# Patient Record
Sex: Female | Born: 1990 | Race: Black or African American | Hispanic: No | Marital: Single | Smoking: Never smoker
Health system: Southern US, Community
[De-identification: ages and names within clinical notes are randomized; demographics above are authoritative.]

## PROBLEM LIST (undated history)

## (undated) DIAGNOSIS — M4120 Other idiopathic scoliosis, site unspecified: Secondary | ICD-10-CM

## (undated) DIAGNOSIS — M549 Dorsalgia, unspecified: Secondary | ICD-10-CM

## (undated) DIAGNOSIS — E119 Type 2 diabetes mellitus without complications: Secondary | ICD-10-CM

## (undated) DIAGNOSIS — B3324 Viral cardiomyopathy: Secondary | ICD-10-CM

## (undated) DIAGNOSIS — T7840XA Allergy, unspecified, initial encounter: Secondary | ICD-10-CM

## (undated) HISTORY — DX: Dorsalgia, unspecified: M54.9

## (undated) HISTORY — DX: Viral cardiomyopathy: B33.24

## (undated) HISTORY — DX: Other idiopathic scoliosis, site unspecified: M41.20

## (undated) HISTORY — DX: Allergy, unspecified, initial encounter: T78.40XA

## (undated) HISTORY — DX: Type 2 diabetes mellitus without complications: E11.9

---
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
## 2012-05-26 DIAGNOSIS — E119 Type 2 diabetes mellitus without complications: Secondary | ICD-10-CM

## 2012-05-26 HISTORY — DX: Type 2 diabetes mellitus without complications: E11.9

## 2012-06-23 ENCOUNTER — Ambulatory Visit (INDEPENDENT_AMBULATORY_CARE_PROVIDER_SITE_OTHER): Payer: BC Managed Care – PPO | Admitting: Family Medicine

## 2012-06-23 ENCOUNTER — Encounter: Payer: Self-pay | Admitting: Family Medicine

## 2012-06-23 ENCOUNTER — Other Ambulatory Visit (HOSPITAL_COMMUNITY)
Admission: RE | Admit: 2012-06-23 | Discharge: 2012-06-23 | Disposition: A | Discharge status: Home / Self Care | Payer: BC Managed Care – PPO | Source: Ambulatory Visit | Attending: Family Medicine | Admitting: Family Medicine

## 2012-06-23 VITALS — BP 118/74 | HR 110 | Resp 15 | Wt 235.0 lb

## 2012-06-23 DIAGNOSIS — N760 Acute vaginitis: Secondary | ICD-10-CM

## 2012-06-23 DIAGNOSIS — R7303 Prediabetes: Secondary | ICD-10-CM

## 2012-06-23 DIAGNOSIS — E669 Obesity, unspecified: Secondary | ICD-10-CM

## 2012-06-23 DIAGNOSIS — B372 Candidiasis of skin and nail: Secondary | ICD-10-CM

## 2012-06-23 DIAGNOSIS — R7309 Other abnormal glucose: Secondary | ICD-10-CM

## 2012-06-23 DIAGNOSIS — Z0000 Encounter for general adult medical examination without abnormal findings: Secondary | ICD-10-CM

## 2012-06-23 LAB — CBC
MCV: 73.4 fL — ABNORMAL LOW (ref 78.0–100.0)
Platelets: 341 10*3/uL (ref 150–400)
RBC: 5.57 MIL/uL — ABNORMAL HIGH (ref 3.87–5.11)
WBC: 9.9 10*3/uL (ref 4.0–10.5)

## 2012-06-23 LAB — COMPREHENSIVE METABOLIC PANEL
CO2: 24 mEq/L (ref 19–32)
Glucose, Bld: 305 mg/dL — ABNORMAL HIGH (ref 70–99)
Sodium: 135 mEq/L (ref 135–145)
Total Bilirubin: 0.5 mg/dL (ref 0.3–1.2)
Total Protein: 7.3 g/dL (ref 6.0–8.3)

## 2012-06-23 LAB — LIPID PANEL
HDL: 37 mg/dL — ABNORMAL LOW (ref >39)
Triglycerides: 141 mg/dL (ref <150)

## 2012-06-23 LAB — HEMOGLOBIN A1C
Hgb A1c MFr Bld: 12.6 % — ABNORMAL HIGH (ref <5.7)
Mean Plasma Glucose: 315 mg/dL — ABNORMAL HIGH (ref <117)

## 2012-06-23 MED ORDER — NYSTATIN 100000 UNIT/GM EX CREA: TOPICAL_CREAM | Freq: Two times a day (BID) | CUTANEOUS | Status: DC

## 2012-06-23 MED ORDER — FLUCONAZOLE 150 MG PO TABS: 150.0000 mg | ORAL_TABLET | Freq: Once | ORAL | Status: AC

## 2012-06-23 NOTE — Patient Instructions (Addendum)
I recommend eye visit once a year I recommend dental visit every 6 months Goal is to  Exercise 30 minutes 5 days a week We will send a letter with lab results  Congratulations on the weight loss Use the cream as prescribed Take Yeast pill  F/U 1 year or as needed

## 2012-06-23 NOTE — Progress Notes (Signed)
  Subjective:    Patient ID: Samantha Lucas, female    DOB: 12-21-1990, 22 y.o.   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 Denies ETOH, illicit substances Immunizations reviewed LMP 3 weeks ago  Review of Systems   GEN- denies fatigue, fever, weight loss,weakness, recent illness HEENT- denies eye drainage, change in vision, nasal discharge, CVS- denies chest pain, palpitations RESP- denies SOB, cough, wheeze ABD- denies N/V, change in stools, abd pain GU- denies dysuria, hematuria, dribbling, incontinence MSK- denies joint pain, muscle aches, injury Neuro- denies headache, dizziness, syncope, seizure activity      Objective:   Physical Exam GEN- NAD, alert and oriented x3 HEENT- PERRL, EOMI, non injected sclera, pink conjunctiva, MMM, oropharynx clear Neck- Supple, no thyromegaly CVS- RRR, no murmur RESP-CTAB ABD-NABS,soft,NT,ND GU- External genitalia normal appearance, erythematous rash with maceration in gluteal cleft extending to labia majora- + white discharge at introitus EXT- No edema Pulses- Radial, DP- 2+        Assessment & Plan:

## 2012-06-24 ENCOUNTER — Encounter: Payer: Self-pay | Admitting: Family Medicine

## 2012-06-24 ENCOUNTER — Ambulatory Visit (INDEPENDENT_AMBULATORY_CARE_PROVIDER_SITE_OTHER): Payer: BC Managed Care – PPO | Admitting: Family Medicine

## 2012-06-24 VITALS — BP 118/74 | HR 88 | Resp 16 | Ht 70.0 in | Wt 237.0 lb

## 2012-06-24 DIAGNOSIS — B372 Candidiasis of skin and nail: Secondary | ICD-10-CM | POA: Insufficient documentation

## 2012-06-24 DIAGNOSIS — E1165 Type 2 diabetes mellitus with hyperglycemia: Secondary | ICD-10-CM | POA: Insufficient documentation

## 2012-06-24 DIAGNOSIS — Z0000 Encounter for general adult medical examination without abnormal findings: Secondary | ICD-10-CM | POA: Insufficient documentation

## 2012-06-24 DIAGNOSIS — E119 Type 2 diabetes mellitus without complications: Secondary | ICD-10-CM | POA: Insufficient documentation

## 2012-06-24 DIAGNOSIS — IMO0001 Reserved for inherently not codable concepts without codable children: Secondary | ICD-10-CM

## 2012-06-24 MED ORDER — INSULIN GLARGINE 100 UNITS/ML SOLOSTAR PEN: PEN_INJECTOR | SUBCUTANEOUS | Status: DC

## 2012-06-24 MED ORDER — LANCETS MISC: Status: DC

## 2012-06-24 NOTE — Patient Instructions (Signed)
Your goal is an A1C of 6.5% Start Lantus at bedtime 10 units Record your blood sugars- fasting ( first thing when you wake up) and before breakfast, lunch and dinner Call if you have any low blood sugars- less than 70 and you have symptoms- dizziness, shaking, sweaty, nausea-- Drink OJ or eat peanut butter, something with sugar in it Review the diet handouts F/U 1 week at [facility]

## 2012-06-24 NOTE — Assessment & Plan Note (Addendum)
CPE completed No PAP Smear needed until age 22 Not sexually active Immunizations UTD Fasting labs to be done Anticipatory guidance given- Safe sex, avoidance of tobacco and ETOH , pt is in community college and working, parents have no concerns

## 2012-06-24 NOTE — Progress Notes (Signed)
  Subjective:    Patient ID: Samantha Lucas, female    DOB: [birth year], 22 y.o.   MRN: [id number]  HPI  Patient here secondary to new diagnosis of diabetes mellitus type 2. She's a history of prediabetes fasting labs were done after her physical exam on yesterday her A1c returned at 12.6% with a fasting glucose of 305. Her mother has a history of prediabetes. She has been trying to lose weight has lost 30 pounds over the past year. She was diagnosed with candidiasis intertrigo in the gluteal and vaginal folds yesterday.  Review of Systems - per above       Objective:   Physical Exam  GEN-NAD,alert and oriented x 3, VSS      Assessment & Plan:

## 2012-06-24 NOTE — Assessment & Plan Note (Addendum)
congraulated on weight loss Continue to work on diet

## 2012-06-24 NOTE — Assessment & Plan Note (Signed)
A1C to be done, concern with obesity and ongoing rash for DM

## 2012-06-24 NOTE — Assessment & Plan Note (Addendum)
Patient with new onset diabetes mellitus type 2 secondary to diet and obesity. She also has family history of prediabetes. With her severely elevated sugars and A1c of 12% I will go ahead and start her on Lantus 10 units at bedtime.  Her mother will was weary of use of metformin. At this time she needs insulin she was taught how to use Lantus in the office as well as One Touch Verio blood glucose monitoring system We went over the notes and bolts of dietary changes for diabetes mellitus. I will have her increase her water intake as well. She is to record her blood sugars fasting and before meals. She will return to the office in one week with her logbook and her meter. I will plan to start her on metformin as well as refer her to a dietitian for counseling. As she is a very young patient and quite nave to many things we will have to take this slowy with multiple appointments to keep her on track. Patient and her mother voiced understanding   approximately 30 minutes spent with patient > than 50% of time spent on counseling on nutrition and treatment of diabetes mellitus

## 2012-06-24 NOTE — Assessment & Plan Note (Signed)
Will treat yeast with diflucan oral and and nystatin topical

## 2012-07-01 ENCOUNTER — Ambulatory Visit (INDEPENDENT_AMBULATORY_CARE_PROVIDER_SITE_OTHER): Payer: BC Managed Care – PPO | Admitting: Family Medicine

## 2012-07-01 VITALS — BP 120/80 | HR 78 | Temp 97.8°F | Resp 18 | Ht 68.0 in | Wt 238.0 lb

## 2012-07-01 DIAGNOSIS — J3489 Other specified disorders of nose and nasal sinuses: Secondary | ICD-10-CM

## 2012-07-01 DIAGNOSIS — R0981 Nasal congestion: Secondary | ICD-10-CM

## 2012-07-01 DIAGNOSIS — IMO0001 Reserved for inherently not codable concepts without codable children: Secondary | ICD-10-CM

## 2012-07-01 MED ORDER — METFORMIN HCL 500 MG PO TABS: 500.0000 mg | ORAL_TABLET | Freq: Two times a day (BID) | ORAL | Status: DC

## 2012-07-01 NOTE — Progress Notes (Signed)
  Subjective:    Patient ID: Samantha Lucas, female    DOB: 1990-03-22, 22 y.o.   MRN: [id number]  HPI  Patient here to followup diabetes mellitus. Diagnosed diabetes mellitus type 2 one week ago with an A1c of 12.6%. She was started on Lantus 10 units at bedtime. She was able to get her insulin however her mother lets me know that this cough the mom was $300 for the supply. She denies any hypoglycemia. Her fasting blood sugars this week have been 128-227. Initially her fasting blood sugars were in the 300s her fasting blood sugar the past 2 days have been less than 130. Her premeal blood sugars have been also improving now in the 160s the range was 160-228  She also complains of some nasal congestion and discharge for the past couple of days. She denies any cough or fever sinus pressure. No over-the-counter medications taken Review of Systems -per above   GEN- denies fatigue, fever, weight loss,weakness, recent illness HEENT- denies eye drainage, change in vision, +nasal discharge, CVS- denies chest pain, palpitations RESP- denies SOB, cough, wheeze Neuro- denies headache, dizziness, syncope, seizure activity      Objective:   Physical Exam  GEN- NAD, alert and oriented x3 HEENT- PERRL, EOMI, non injected sclera, pink conjunctiva, MMM, oropharynx clear, TM clear bilat no effusion,no maxillary sinus tenderness, nares ckear Neck- Supple, no LAD CVS- RRR, no murmur RESP-CTAB Pulses- Radial 2+            Assessment & Plan:

## 2012-07-01 NOTE — Patient Instructions (Addendum)
Nutrition referral for diabetes at [facility] the Metformin take 1 tablet at dinner for the next 7 days, then take 1 tablet twice a day with meals  Continue Lantus 10 units at bedtime F/U 4 weeks

## 2012-07-02 ENCOUNTER — Encounter: Payer: Self-pay | Admitting: Family Medicine

## 2012-07-02 DIAGNOSIS — R0981 Nasal congestion: Secondary | ICD-10-CM | POA: Insufficient documentation

## 2012-07-02 NOTE — Assessment & Plan Note (Signed)
CBG have improved with use of insulin, will add Metformin , see instructions, hopefully we will be able to get her off the insulin with proper diet and weight control Continue lantus 10 units Referral to diabetic nutritionist [facility]

## 2012-07-02 NOTE — Assessment & Plan Note (Signed)
No signs of acute infection, could be early URI, nasal saline

## 2012-07-05 ENCOUNTER — Telehealth: Payer: Self-pay | Admitting: Family Medicine

## 2012-07-05 ENCOUNTER — Other Ambulatory Visit: Payer: Self-pay | Admitting: Family Medicine

## 2012-07-05 DIAGNOSIS — E119 Type 2 diabetes mellitus without complications: Secondary | ICD-10-CM

## 2012-07-05 NOTE — Telephone Encounter (Signed)
REFERRAL IN WORK QUE

## 2012-07-05 NOTE — Telephone Encounter (Signed)
Message copied by Samuella Cota on Tue Jul 05, 2012 10:35 AM ------      Message from: Salley Scarlet      Created: Sat Jul 02, 2012  7:09 AM      Regarding: Please set up with [facility] Out patient Nutritionist              [facility] is no way to order this in Epic       We used a paper order form              Dx- [phone] ------

## 2012-07-08 ENCOUNTER — Telehealth (HOSPITAL_COMMUNITY): Payer: Self-pay | Admitting: Dietician

## 2012-07-08 NOTE — Telephone Encounter (Signed)
Received referral via [facility] for dx: new onset diabetes.

## 2012-07-08 NOTE — Telephone Encounter (Signed)
Called pt at 0941. Appointment scheduled for 07/26/12 at 1400.

## 2012-07-14 ENCOUNTER — Encounter: Payer: Self-pay | Admitting: Family Medicine

## 2012-07-14 NOTE — Progress Notes (Unsigned)
Patient ID: Samantha Lucas, female   DOB: May 23, 1990, 22 y.o.   MRN: [id number] Called pharmacy to see what was going on with patients lantus and pharmacy said that her insurance covered her lantus since 05/25/12. I called pt and left message on voice mail for her to return my call so that I can let her know and that she can pick them up.

## 2012-07-26 ENCOUNTER — Telehealth (HOSPITAL_COMMUNITY): Payer: Self-pay | Admitting: Dietician

## 2012-07-26 NOTE — Telephone Encounter (Signed)
Pt was a no-show for appointment scheduled for 07/26/2012 at 1400. Sent letter to pt home notifying pt of no-show and requesting rescheduling appointment.

## 2012-07-27 NOTE — Telephone Encounter (Signed)
noted 

## 2012-07-28 ENCOUNTER — Telehealth (HOSPITAL_COMMUNITY): Payer: Self-pay | Admitting: Dietician

## 2012-07-28 NOTE — Telephone Encounter (Signed)
Received voicemail from mother left on 1328. She requests rescheduling appointment. She was apologetic for missing previous appointment- she reports she wrote down the wrong date.

## 2012-07-28 NOTE — Telephone Encounter (Signed)
Appointment rescheduled for 08/10/12 at 1400.

## 2012-08-08 ENCOUNTER — Encounter: Payer: Self-pay | Admitting: Family Medicine

## 2012-08-08 ENCOUNTER — Ambulatory Visit (INDEPENDENT_AMBULATORY_CARE_PROVIDER_SITE_OTHER): Payer: BC Managed Care – PPO | Admitting: Family Medicine

## 2012-08-08 VITALS — BP 120/80 | HR 80 | Temp 98.5°F | Resp 20 | Wt 241.0 lb

## 2012-08-08 DIAGNOSIS — IMO0001 Reserved for inherently not codable concepts without codable children: Secondary | ICD-10-CM

## 2012-08-08 MED ORDER — METFORMIN HCL 1000 MG PO TABS: 1000.0000 mg | ORAL_TABLET | Freq: Two times a day (BID) | ORAL | Status: DC

## 2012-08-08 MED ORDER — LISINOPRIL 2.5 MG PO TABS: 2.5000 mg | ORAL_TABLET | Freq: Every day | ORAL | Status: DC

## 2012-08-08 NOTE — Patient Instructions (Addendum)
New dose of the metformin is 1000mg  twice  A day with meals Continue lantus 10 units Start the lisinopril once a day for your kidney's F/U 1st week of September

## 2012-08-08 NOTE — Progress Notes (Signed)
  Subjective:    Patient ID: Samantha Lucas, female    DOB: [birth year], 22 y.o.   MRN: [id number]  HPI  Patient here for interim visit for her diabetes mellitus. She's currently on Lantus 10 units and metformin 500 mg twice a day. She denies any hypoglycemia. Her fasting blood sugars have been 89-180. Post meals 140-180. She did have a blood sugar of 85 before bedtime one night and did not take the Lantus. Unfortunately she is not following her diet and has picked a fast food again. She's gained 3 pounds since her last visit.  Her blood glucose meter/readings were reviewed in the office  Review of Systems - per above   GEN- denies fatigue, fever, weight loss,weakness, recent illness ENDO- denies polyuria, polydipsia, hypoglycemia        Objective:   Physical Exam GEN-NAD,alert and oriented x 3       Assessment & Plan:

## 2012-08-09 NOTE — Assessment & Plan Note (Addendum)
Continue lantus as I titrate orals, CBG have improved, increase metformin to 1 gram BID Add lose dose ACEI Goal is to get her on proper diet and oral medications F/U for A1C in 6 weeks

## 2012-08-10 ENCOUNTER — Encounter (HOSPITAL_COMMUNITY): Payer: Self-pay | Admitting: Dietician

## 2012-08-10 NOTE — Progress Notes (Signed)
Outpatient Initial Nutrition Assessment  Date:08/10/2012   Appt Start Time: 1356  Referring Physician: Dr. Jeanice Lim Reason for Visit: new onset DM  Nutrition Assessment:  Height: 5\' 8"  (172.7 cm)  93%ile (Z=1.45) based on CDC 2-20 Years stature-for-age data. Weight: 242 lb (109.77 kg)  99%ile (Z=2.44) based on CDC 2-20 Years weight-for-age data. IBW: 140# %IBW: 173% UBW: 235# %UBW: 103%  Body mass index is 36.8 kg/(m^2). Meets criteria for obesity (BMI >95%ile for age). Goal Weight: 218# (10% loss of current weight) Weight hx: Pt reports highest weight was 242# (today's weight). Samantha Lucas reports that her weight is usually around 235#.   Estimated nutritional needs:  Kcals/ day: 1900-2000 Protein (grams)/day: 88-110 Fluid (L)/ day: 1.9-2.0  PMH:  Past Medical History  Diagnosis Date  . Scoliosis (and kyphoscoliosis), idiopathic   . Diabetes mellitus without complication May 2014    Medications:  Current Outpatient Rx  Name  Route  Sig  Dispense  Refill  . insulin glargine (LANTUS) 100 units/mL SOLN      Inject 10 units at bedtime   10 mL   6     Please given lantus pen and pen needles - new onse ...   . Lancets MISC      One Touch verio lancets  Dx 250.00, Test CBG four times a day   100 each   11   . lisinopril (ZESTRIL) 2.5 MG tablet   Oral   Take 1 tablet (2.5 mg total) by mouth daily. To protect kidneys   30 tablet   3   . metFORMIN (GLUCOPHAGE) 1000 MG tablet   Oral   Take 1 tablet (1,000 mg total) by mouth 2 (two) times daily with a meal.   60 tablet   3     New dose   . Multiple Vitamin (MULITIVITAMIN WITH MINERALS) TABS   Oral   Take 1 tablet by mouth daily.         . NON FORMULARY      One Touch Verio- Glucometer and test strips Test CBG Four times a day Dx 250.00         . nystatin cream (MYCOSTATIN)   Topical   Apply topically 2 (two) times daily.   30 g   2     Labs: CMP     Component Value Date/Time   NA 135 06/23/2012 1000   K 4.5  06/23/2012 1000   CL 100 06/23/2012 1000   CO2 24 06/23/2012 1000   GLUCOSE 305* 06/23/2012 1000   BUN 9 06/23/2012 1000   CREATININE 0.63 06/23/2012 1000   CREATININE 0.62 04/14/2007 1200   CALCIUM 9.8 06/23/2012 1000   PROT 7.3 06/23/2012 1000   ALBUMIN 4.0 06/23/2012 1000   AST 11 06/23/2012 1000   ALT 8 06/23/2012 1000   ALKPHOS 140* 06/23/2012 1000   BILITOT 0.5 06/23/2012 1000   GFRNONAA NOT CALCULATED 04/14/2007 1200   GFRAA  Value: NOT CALCULATED        The eGFR has been calculated using the MDRD equation. This calculation has not been validated in all clinical 04/14/2007 1200    Lipid Panel     Component Value Date/Time   CHOL 162 06/23/2012 1000   TRIG 141 06/23/2012 1000   HDL 37* 06/23/2012 1000   CHOLHDL 4.4 06/23/2012 1000   VLDL 28 06/23/2012 1000   LDLCALC 97 06/23/2012 1000     Lab Results  Component Value Date   HGBA1C 12.6* 06/23/2012   Lab  Results  Component Value Date   LDLCALC 97 06/23/2012   CREATININE 0.63 06/23/2012     Lifestyle/ social habits: Samantha Lucas who resides in [place] with her mother and maternal grandparents. 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 During the school year, Samantha Lucas attends nursing school at [employer]. Samantha Lucas reports her stress level as 1/10, stating Samantha Lucas is not stressed at all. Samantha Lucas reports that Samantha Lucas has been trying to be physically active, but admits that Samantha Lucas is not as active as Samantha Lucas should be. Samantha Lucas started doing ab crunches with the assistance of an app on her phone. Samantha Lucas was also walking daily, but recently stopped due to the hot weather. Samantha Lucas also plays her Wii Fit.  Nutrition hx/habits: Samantha Lucas reports Samantha Lucas is concerned about her dx, due to her family hx. Diabetes runs on both her mother's and father's side of the family; Samantha Lucas reveals that her father passed away several years ago from a stroke. Samantha Lucas has been trying to follow a healthier diet, which mainly consisted of baked foods and  vegetables. Samantha Lucas reports that Samantha Lucas recently resumed her fast food habits and has gained weight in the past few months.  Samantha Lucas has been consistent with her self management practices. Samantha Lucas checks her blood sugars 6 times per day: Am fasting, before and lafter lunch and dinner, and q HS. Samantha Lucas reports AM fasting readings in the 110s, 120 before meals, 135-147 postprandial, and 150-170 q HS.  Samantha Lucas normally eats out 1-2 times per week and frequents [employer] (beef burrito and nachos) or [employer] (gets 12" grilled chicken sandwich or grilled chicken salad). When Samantha Lucas eats out, Samantha Lucas orders sweet tea. When Samantha Lucas is home, Samantha Lucas makes good food choices, choosing baked meats, vegetables, and water. Samantha Lucas expresses concern about transitioning care once school has started.    Diet recall: Breakfast (9 Am-12PM): 2 slices of white toast with jelly, 2 slices bacon, 1 scrambled egg, water; Lunch (2-4 PM): fast food meal ([employer] (beef burrito and nachos) or [employer] (gets 12" grilled chicken sandwich or grilled chicken salad), sweet tea) OR baked tilapia, broccoli, water; Dinner: broccoli, tilapia, water; HS snack: almonds.   Nutrition Diagnosis: Nutrition-related knowledge deficit r/t new onset diabetes AEB Hgb A1c: 12.6.  Nutrition Intervention: Nutrition rx: 1500 kcal NAS, diabetic diet; 3 meals per day (45-60 grams carbohydrate per meal); no snacks; low calorie beverages only; 30 minutes physical activity daily  Education/Counseling Provided: Educated pt on principles of diabetic diet ad weight management. Discussed carbohydrate metabolism in relation to diabetes. Educated pt on basic self-management principles including: signs and symptoms of hyperglycemia and hypoglycemia, goals for fasting and postprandial blood sugars, goals for Hgb A1c, importance of checking feet, importance of keeping PCP appointments, and foot care. Discussed principles of energy expenditure and how changes in diet and physical activity affect weight status.  Educated pt on plate method, portion sizes, and sources of carbohydrate. Discussed importance of regular meal pattern. Discussed importance of adding sources of whole grains to diet to improve glycemic control. Also encouraged to choose low fat dairy, lean meats, and whole fruits and vegetables more often. Discussed options of artificial sweeteners and encouraged pt to use which brand Samantha Lucas liked best. Discussed nutritional content of foods commonly eaten and discussed healthier alternatives. Discussed importance of compliance to prevent further complications of disease. Educated pt on importance of physical activity (goal of at least 30 minutes 5 times per week) along  with a healthy diet to achieve weight loss and glycemic goals. Encouraged slow, moderate weight loss of 1-2# per week, or 7-10% of current body weight. Discussed adopting lifelong lifestyle changes vs. Attaining a certain weight or body type. Discussed specific examples of ways pt can make better choices at restaurants. Provided "10 Simple Steps", "Diabetes and You", "Carbohydrate Counting and Meal Planning", and "Blood Sugar Diary" handouts.Showed pt functionality of MyFitnessPal and encouraged using a food diary to better track caloric intake. Used TeachBack to assess understanding.   Understanding, Motivation, Ability to Follow Recommendations: Expect good compliance.   Monitoring and Evaluation: Goals: 1) 0.5-2# wt loss per week; 2) Hgb A1c <7; 3) Fasting blood sugar: 70-130, postprandial less than 180; 4) Keep food diary (ex My FitnessPal); 5) 30 minutes physical activity daily   Recommendations: 1) For weight loss: 1400-1500 kcals daily; 2) Choose kids meal or dollar menu entrees when eating out; 3) Share entrees at restaurants or take 1/2 home in a take away box; 4) Break physical activity into smaller, more frequent weight sessions; 5) Walking in the early or later part of the day, when it is not as hot  F/U: 4-6 weeks. Appointment  scheduled for 09/07/12 at 1400.   Melody Haver, RD, LDN 08/10/2012  Appt EndTime: 1530

## 2012-09-07 ENCOUNTER — Telehealth (HOSPITAL_COMMUNITY): Payer: Self-pay | Admitting: Dietician

## 2012-09-07 NOTE — Telephone Encounter (Signed)
Received voicemail from pt left at 0930. Pt would like to reschedule today's appointment at 1400, due to death in the family.

## 2012-09-07 NOTE — Telephone Encounter (Signed)
Called back at 1408. Rescheduled for 09/20/12 at 1400.

## 2012-09-20 ENCOUNTER — Encounter (HOSPITAL_COMMUNITY): Payer: Self-pay | Admitting: Dietician

## 2012-09-20 NOTE — Progress Notes (Signed)
Follow-Up Outpatient Nutrition Note Date: 09/20/2012  Appt Start Time: 1340  Nutrition Assessment:  Current weight: Weight: 248 lb (112.492 kg)  BMI: Body mass index is 37.72 kg/(m^2).  Weight changes: +6# (2.5%) x 6 weeks  Unfortunately, Samantha Lucas has gained weight since last visit. She reports that her clothes are looser, but not enough to buy smaller sizes in clothing. She reports to me she struggles with her diet, especially due to her school schedule. She does not always get an adequate lunch break. Mondays, Wednesdays, and Thursdays are busiest for her because she has lab classes and stays on campus pretty much all day. She rarely eats fast food, but when she does she orders a plain chicken sandwich. She always has water with her.  CBGs are good. She is now taking CBGs 4 times per day- Am fasting between 80-90, postprandial breakfast and dinner less than 140, and HS 70-100. She reports that her blood sugar trends low in the evening and she does not always take her nighttime insulin for that reason.  Overall, she is making better food choices. However, she continues to be physically inactive. She reports that she has not been exercising due to the hot weather. Physical activity mainly consists of 5-10 minutes of walking between classes. She reveals that there is a Building surveyor on campus that she can use free of charge and is contemplating working out with her cousin, who is also a Consulting civil engineer there. She is having some difficult with time management to optimally balance her health, work, and school work.   Labs: CMP     Component Value Date/Time   NA 135 06/23/2012 1000   K 4.5 06/23/2012 1000   CL 100 06/23/2012 1000   CO2 24 06/23/2012 1000   GLUCOSE 305* 06/23/2012 1000   BUN 9 06/23/2012 1000   CREATININE 0.63 06/23/2012 1000   CREATININE 0.62 04/14/2007 1200   CALCIUM 9.8 06/23/2012 1000   PROT 7.3 06/23/2012 1000   ALBUMIN 4.0 06/23/2012 1000   AST 11 06/23/2012 1000   ALT 8 06/23/2012 1000   ALKPHOS  140* 06/23/2012 1000   BILITOT 0.5 06/23/2012 1000   GFRNONAA NOT CALCULATED 04/14/2007 1200   GFRAA  Value: NOT CALCULATED        The eGFR has been calculated using the MDRD equation. This calculation has not been validated in all clinical 04/14/2007 1200    Lipid Panel     Component Value Date/Time   CHOL 162 06/23/2012 1000   TRIG 141 06/23/2012 1000   HDL 37* 06/23/2012 1000   CHOLHDL 4.4 06/23/2012 1000   VLDL 28 06/23/2012 1000   LDLCALC 97 06/23/2012 1000     Lab Results  Component Value Date   HGBA1C 12.6* 06/23/2012   Lab Results  Component Value Date   LDLCALC 97 06/23/2012   CREATININE 0.63 06/23/2012     Diet recall: Breakfast: 2 whole wheat eggo waffles with light syrup; Lunch: pbj and almonds; Dinner: chicken stirfry. Beverages are mainly water.   Nutrition Diagnosis: Nutrition-related knowledge deficit r/t new onset diabetes- continues  Nutrition Intervention: Nutrition rx: 1500 kcal NAS, diabetic diet; 3 meals per day (45-60 grams carbohydrate per meal); no snacks; low calorie beverages only; 30 minutes physical activity daily  Education/ counseling provided: Reviewed diet recall and exercise. Discussed specific examples in which pt could improve diet. Praised pt for progress made in diet. Discussed importance of regular physical activity to aid in weight management and glycemic control. Discussed ways to  incorporate physical activity in daily routine. Stressed importance of exercising outside of daily routine. Teachback method used.   Understanding/Motivation/ Ability to follow recommendations: Expect fair to good compliance.  Monitoring and Evaluation: Previous Goals: 1) 0.5-2# wt loss per week- goal not met; 2) Hgb A1c <7; 3) Fasting blood sugar: 70-130, postprandial less than 180- goal met; 4) Keep food diary (ex My FitnessPal)- goal not met; 5) 30 minutes physical activity daily- goal not met   Goals for next visit: 1) 0.5-2# wt loss per week; 2) Hgb A1c <7; 3) Fasting  blood sugar: 70-130, postprandial less than 180; 4) 2.5 hours physical activity per week  Recommendations: 1) Break physical activity into smaller, more frequent sessions; 2) Walk in the early or later part of the day, when it is not as hot; 3) Use [employer] student gym with cousin and classmates when at school  F/U: 4-6 weeks. Scheduled for Tuesday 11/01/12 at 1400.  Paulino Cork A. Mayford Knife, RD, LDN Date:09/20/2012 Appt EndTime:

## 2012-09-30 ENCOUNTER — Ambulatory Visit (INDEPENDENT_AMBULATORY_CARE_PROVIDER_SITE_OTHER): Payer: BC Managed Care – PPO | Admitting: Family Medicine

## 2012-09-30 VITALS — BP 120/80 | HR 90 | Temp 98.1°F | Resp 20 | Wt 249.0 lb

## 2012-09-30 DIAGNOSIS — IMO0001 Reserved for inherently not codable concepts without codable children: Secondary | ICD-10-CM

## 2012-09-30 DIAGNOSIS — E669 Obesity, unspecified: Secondary | ICD-10-CM

## 2012-09-30 LAB — HEMOGLOBIN A1C, FINGERSTICK: Hgb A1C (fingerstick): 6.7 % — ABNORMAL HIGH (ref <5.7)

## 2012-09-30 MED ORDER — GLIPIZIDE 5 MG PO TABS: 5.0000 mg | ORAL_TABLET | Freq: Two times a day (BID) | ORAL | Status: DC

## 2012-09-30 MED ORDER — METFORMIN HCL 500 MG PO TABS: 500.0000 mg | ORAL_TABLET | Freq: Two times a day (BID) | ORAL | Status: DC

## 2012-09-30 NOTE — Progress Notes (Signed)
  Subjective:    Patient ID: Samantha Lucas, female    DOB: 05/31/1990, 22 y.o.   MRN: [id number]  HPI  Pt here to f/u DM, diagnosed 3 months ago, with Type II DM. Fasting CBG have been 80-120. Lunch 116-138, dinner 92-142, she had 2 episodes of hypoglycemia where she missed lunch at school.  Denies polyuria, polydipsia.  No other complaints Following with nutrition, but is not exercising Restarted college classes recently  Review of Systems   GEN- denies fatigue, fever, weight loss,weakness, recent illness HEENT- denies eye drainage, change in vision, nasal discharge, CVS- denies chest pain, palpitations RESP- denies SOB, cough, wheeze ABD- denies N/V, change in stools, abd pain GU- denies dysuria, hematuria, dribbling, incontinence MSK- denies joint pain, muscle aches, injury Neuro- denies headache, dizziness, syncope, seizure activity      Objective:   Physical Exam GEN- NAD, alert and oriented x3 HEENT- PERRL, EOMI, non injected sclera, pink conjunctiva, MMM, oropharynx clear CVS- RRR, no murmur RESP-CTAB EXT- No edema Pulses- Radial, DP- 2+        Assessment & Plan:

## 2012-09-30 NOTE — Patient Instructions (Signed)
Take 1/2 tablet of the metformin twice a day until you run out , then get new prescription Start glipzide 1 tablet twice a day Check blood sugars fasting  Stop the lantus  F/U 3 months

## 2012-10-01 ENCOUNTER — Encounter: Payer: Self-pay | Admitting: Family Medicine

## 2012-10-01 NOTE — Assessment & Plan Note (Addendum)
A1C shows excellent progress. I think with her attention to meals and nutrition we can safely discontinue insulin and switch her over to orals Since the higher dose of Metformin is causing diarrhea, will decrease her to metformin 500mg  BID and add Glipizide 5mg  BID Continue ACEI She does not have Eye insurance but will look into [facility]  Urine Micro next visit

## 2012-10-01 NOTE — Assessment & Plan Note (Signed)
Weight gain noted, she is not exercising , short term goal set

## 2012-11-01 ENCOUNTER — Telehealth (HOSPITAL_COMMUNITY): Payer: Self-pay | Admitting: Dietician

## 2012-11-01 NOTE — Telephone Encounter (Signed)
Pt was a no-show for appointment scheduled for 11/01/2012 at 1400. Sent letter to pt home notifying pt of no-show and requesting rescheduling appointment.

## 2012-12-01 ENCOUNTER — Other Ambulatory Visit: Payer: Self-pay

## 2012-12-06 ENCOUNTER — Other Ambulatory Visit: Payer: Self-pay | Admitting: *Deleted

## 2012-12-06 MED ORDER — LISINOPRIL 2.5 MG PO TABS: 2.5000 mg | ORAL_TABLET | Freq: Every day | ORAL | Status: DC

## 2012-12-06 NOTE — Telephone Encounter (Signed)
Meds refilled.

## 2012-12-30 ENCOUNTER — Ambulatory Visit: Payer: BC Managed Care – PPO | Admitting: Family Medicine

## 2013-01-10 ENCOUNTER — Ambulatory Visit (INDEPENDENT_AMBULATORY_CARE_PROVIDER_SITE_OTHER): Payer: BC Managed Care – PPO | Admitting: Family Medicine

## 2013-01-10 ENCOUNTER — Encounter: Payer: Self-pay | Admitting: Family Medicine

## 2013-01-10 VITALS — BP 110/70 | HR 80 | Temp 98.3°F | Resp 18 | Ht 70.0 in | Wt 260.0 lb

## 2013-01-10 DIAGNOSIS — E669 Obesity, unspecified: Secondary | ICD-10-CM

## 2013-01-10 DIAGNOSIS — IMO0001 Reserved for inherently not codable concepts without codable children: Secondary | ICD-10-CM

## 2013-01-10 DIAGNOSIS — Z23 Encounter for immunization: Secondary | ICD-10-CM

## 2013-01-10 LAB — CBC
HCT: 34.9 % — ABNORMAL LOW (ref 36.0–46.0)
Hemoglobin: 11.3 g/dL — ABNORMAL LOW (ref 12.0–15.0)
RBC: 4.93 MIL/uL (ref 3.87–5.11)
WBC: 11.6 10*3/uL — ABNORMAL HIGH (ref 4.0–10.5)

## 2013-01-10 LAB — BASIC METABOLIC PANEL
Calcium: 9.4 mg/dL (ref 8.4–10.5)
Sodium: 135 mEq/L (ref 135–145)

## 2013-01-10 LAB — MICROALBUMIN / CREATININE URINE RATIO
Creatinine, Urine: 224.4 mg/dL
Microalb, Ur: 0.5 mg/dL (ref 0.00–1.89)

## 2013-01-10 MED ORDER — GLIPIZIDE 5 MG PO TABS: 5.0000 mg | ORAL_TABLET | Freq: Two times a day (BID) | ORAL | Status: DC

## 2013-01-10 NOTE — Assessment & Plan Note (Addendum)
She's had some deterioration in her A1c to 7.1%. I will continue her current medications. We discussed the weight gain and nutrition. I've given her specific handout and diet plan regarding her diabetes to He is on ACE inhibitor. I will check her urine microalbumin today. Flu shot and pneumovax

## 2013-01-10 NOTE — Assessment & Plan Note (Signed)
Short-term goal set for weight loss

## 2013-01-10 NOTE — Patient Instructions (Signed)
We will send a letter if the rest of the labs are normal Work on weight loss Goal 15lbs by next visit  Review handouts on healthy eating  Flu shot and pneumonia shot given F/U 3 months

## 2013-01-10 NOTE — Progress Notes (Signed)
   Subjective:    Patient ID: Samantha Lucas, female    DOB: Oct 29, 1990, 22 y.o.   MRN: [id number]  HPI Patient here to followup diabetes mellitus. Her last A1c was 6.7%. She's currently on glipizide and metformin. Her blood sugars fasting on average have been 119. Her 7 day average was 118. She has gained 11 pounds since her last visit states she's been eating a lot fast food which is at her college. She's also not been exercising. She has missed her appointment with the nutritionist. Due for flu shot and pneumonia vaccine She has no specific concerns today. Denies any hypoglycemia symptoms.   Review of Systems  GEN- denies fatigue, fever, weight loss,weakness, recent illness HEENT- denies eye drainage, change in vision, nasal discharge, CVS- denies chest pain, palpitations RESP- denies SOB, cough, wheeze ABD- denies N/V, change in stools, abd pain GU- denies dysuria, hematuria, dribbling, incontinence MSK- denies joint pain, muscle aches, injury Neuro- denies headache, dizziness, syncope, seizure activity      Objective:   Physical Exam GEN- NAD, alert and oriented x3 HEENT- PERRL, EOMI, non injected sclera, pink conjunctiva, MMM, oropharynx clear Neck- Supple, no thyromegaly CVS- RRR, no murmur RESP-CTAB EXT- No edema, feet no open lesions, normal monofilament Pulses- Radial, DP- 2+        Assessment & Plan:

## 2013-01-11 ENCOUNTER — Encounter: Payer: Self-pay | Admitting: *Deleted

## 2013-03-17 ENCOUNTER — Other Ambulatory Visit: Payer: Self-pay | Admitting: Family Medicine

## 2013-04-10 ENCOUNTER — Ambulatory Visit: Payer: BC Managed Care – PPO | Admitting: Family Medicine

## 2013-04-14 ENCOUNTER — Ambulatory Visit (INDEPENDENT_AMBULATORY_CARE_PROVIDER_SITE_OTHER): Payer: BC Managed Care – PPO | Admitting: Family Medicine

## 2013-04-14 ENCOUNTER — Encounter: Payer: Self-pay | Admitting: Family Medicine

## 2013-04-14 VITALS — BP 120/78 | HR 82 | Temp 98.2°F | Resp 16 | Ht 70.0 in | Wt 256.0 lb

## 2013-04-14 DIAGNOSIS — IMO0001 Reserved for inherently not codable concepts without codable children: Secondary | ICD-10-CM

## 2013-04-14 DIAGNOSIS — E1165 Type 2 diabetes mellitus with hyperglycemia: Principal | ICD-10-CM

## 2013-04-14 DIAGNOSIS — E669 Obesity, unspecified: Secondary | ICD-10-CM

## 2013-04-14 MED ORDER — LISINOPRIL 2.5 MG PO TABS: 2.5000 mg | ORAL_TABLET | Freq: Every day | ORAL | Status: DC

## 2013-04-14 NOTE — Patient Instructions (Signed)
We will call with lab results  Continue current medications for now  F/U 3 months  

## 2013-04-15 LAB — BASIC METABOLIC PANEL
BUN: 7 mg/dL (ref 6–23)
CO2: 27 mEq/L (ref 19–32)
CREATININE: 0.64 mg/dL (ref 0.50–1.10)
Calcium: 9.1 mg/dL (ref 8.4–10.5)
Chloride: 104 mEq/L (ref 96–112)
GLUCOSE: 155 mg/dL — AB (ref 70–99)
POTASSIUM: 4.2 meq/L (ref 3.5–5.3)
Sodium: 138 mEq/L (ref 135–145)

## 2013-04-15 LAB — CBC WITH DIFFERENTIAL/PLATELET
BASOS PCT: 0 % (ref 0–1)
Basophils Absolute: 0 10*3/uL (ref 0.0–0.1)
EOS PCT: 2 % (ref 0–5)
Eosinophils Absolute: 0.2 10*3/uL (ref 0.0–0.7)
HEMATOCRIT: 34.8 % — AB (ref 36.0–46.0)
HEMOGLOBIN: 10.9 g/dL — AB (ref 12.0–15.0)
Lymphocytes Relative: 34 % (ref 12–46)
Lymphs Abs: 3.5 10*3/uL (ref 0.7–4.0)
MCH: 22.7 pg — AB (ref 26.0–34.0)
MCHC: 31.3 g/dL (ref 30.0–36.0)
MCV: 72.5 fL — AB (ref 78.0–100.0)
MONO ABS: 1 10*3/uL (ref 0.1–1.0)
MONOS PCT: 10 % (ref 3–12)
NEUTROS ABS: 5.6 10*3/uL (ref 1.7–7.7)
Neutrophils Relative %: 54 % (ref 43–77)
Platelets: 420 10*3/uL — ABNORMAL HIGH (ref 150–400)
RBC: 4.8 MIL/uL (ref 3.87–5.11)
RDW: 17.9 % — AB (ref 11.5–15.5)
WBC: 10.4 10*3/uL (ref 4.0–10.5)

## 2013-04-15 LAB — HEMOGLOBIN A1C
Hgb A1c MFr Bld: 7 % — ABNORMAL HIGH (ref <5.7)
MEAN PLASMA GLUCOSE: 154 mg/dL — AB (ref <117)

## 2013-04-15 LAB — LIPID PANEL
CHOL/HDL RATIO: 3.6 ratio
Cholesterol: 137 mg/dL (ref 0–200)
HDL: 38 mg/dL — ABNORMAL LOW (ref >39)
LDL Cholesterol: 83 mg/dL (ref 0–99)
Triglycerides: 81 mg/dL (ref <150)
VLDL: 16 mg/dL (ref 0–40)

## 2013-04-16 NOTE — Assessment & Plan Note (Signed)
Goals A1c less than 7% will prefer her closer to 6.5% she continues to work on her diet she is on an ACE inhibitor, holding on statin drug at this time as she starts to improve her glucose I will check lipid panel today

## 2013-04-16 NOTE — Assessment & Plan Note (Signed)
20 to encourage weight loss and healthy eating with her diabetes mellitus a young age

## 2013-04-16 NOTE — Progress Notes (Signed)
Patient ID: Samantha Lucas, female   DOB: 03/11/1990, 23 y.o.   MRN: [id number]     Subjective:    Patient ID: Samantha Rippleavon R Rothlisberger, female    DOB: 04/16/1990, 22 y.o.   MRN: [id number]  Patient presents for 3 month F/U   Patient here to followup diabetes mellitus. Her last A1c was 7.1%. She's currently on metformin 500 mg twice a day and glipizide she's no longer on insulin therapy. Her meter shows a 30 day average of 108 fastings of one week average of 101 fasting. She's not had any hypoglycemia. She's trying to pack her lunch more intake healthier options with her school. She does note that her sugar the like his troponin by the time she gets home from her classes and she's been unable to eat for about 5 or 6 hours.    Review Of Systems:  GEN- denies fatigue, fever, weight loss,weakness, recent illness HEENT- denies eye drainage, change in vision, nasal discharge, CVS- denies chest pain, palpitations RESP- denies SOB, cough, wheeze ABD- denies N/V, change in stools, abd pain GU- denies dysuria, hematuria, dribbling, incontinence MSK- denies joint pain, muscle aches, injury Neuro- denies headache, dizziness, syncope, seizure activity       Objective:    BP 120/78  Pulse 82  Temp(Src) 98.2 F (36.8 C)  Resp 16  Ht 5\' 10"  (1.778 m)  Wt 256 lb (116.121 kg)  BMI 36.73 kg/m2  LMP 04/14/2013 GEN- NAD, alert and oriented x3 HEENT- PERRL, EOMI, non injected sclera, pink conjunctiva, MMM, oropharynx clear CVS- RRR, no murmur RESP-CTAB EXT- No edema Pulses- Radial, DP- 2+        Assessment & Plan:      Problem List Items Addressed This Visit   Type II or unspecified type diabetes mellitus without mention of complication, uncontrolled - Primary     Goals A1c less than 7% will prefer her closer to 6.5% she continues to work on her diet she is on an ACE inhibitor, holding on statin drug at this time as she starts to improve her glucose I will check lipid panel today    Relevant  Medications      lisinopril (PRINIVIL,ZESTRIL) tablet   Other Relevant Orders      CBC with Differential (Completed)      Basic metabolic panel (Completed)      Lipid panel (Completed)      Hemoglobin A1c (Completed)   Obesity     20 to encourage weight loss and healthy eating with her diabetes mellitus a young age       Note: This dictation was prepared with Nurse, children'sDragon dictation along with smaller Lobbyistphrase technology. Any transcriptional errors that result from this process are unintentional.

## 2013-06-19 ENCOUNTER — Other Ambulatory Visit: Payer: Self-pay | Admitting: Family Medicine

## 2013-06-20 NOTE — Telephone Encounter (Signed)
Medication refilled per protocol. 

## 2013-07-10 ENCOUNTER — Other Ambulatory Visit: Payer: Self-pay | Admitting: Family Medicine

## 2013-07-17 ENCOUNTER — Ambulatory Visit (INDEPENDENT_AMBULATORY_CARE_PROVIDER_SITE_OTHER): Payer: BC Managed Care – PPO | Admitting: Family Medicine

## 2013-07-17 ENCOUNTER — Encounter: Payer: Self-pay | Admitting: Family Medicine

## 2013-07-17 VITALS — BP 128/76 | HR 84 | Temp 98.5°F | Resp 16 | Ht 68.5 in | Wt 262.0 lb

## 2013-07-17 DIAGNOSIS — D509 Iron deficiency anemia, unspecified: Secondary | ICD-10-CM

## 2013-07-17 DIAGNOSIS — E669 Obesity, unspecified: Secondary | ICD-10-CM

## 2013-07-17 DIAGNOSIS — E1165 Type 2 diabetes mellitus with hyperglycemia: Principal | ICD-10-CM

## 2013-07-17 DIAGNOSIS — IMO0001 Reserved for inherently not codable concepts without codable children: Secondary | ICD-10-CM

## 2013-07-17 LAB — COMPREHENSIVE METABOLIC PANEL
ALBUMIN: 3.9 g/dL (ref 3.5–5.2)
ALT: 8 U/L (ref 0–35)
AST: 12 U/L (ref 0–37)
Alkaline Phosphatase: 107 U/L (ref 39–117)
BUN: 6 mg/dL (ref 6–23)
CALCIUM: 9 mg/dL (ref 8.4–10.5)
CO2: 24 mEq/L (ref 19–32)
Chloride: 102 mEq/L (ref 96–112)
Creat: 0.62 mg/dL (ref 0.50–1.10)
GLUCOSE: 222 mg/dL — AB (ref 70–99)
POTASSIUM: 4.2 meq/L (ref 3.5–5.3)
SODIUM: 135 meq/L (ref 135–145)
TOTAL PROTEIN: 7.3 g/dL (ref 6.0–8.3)
Total Bilirubin: 0.4 mg/dL (ref 0.2–1.2)

## 2013-07-17 LAB — CBC WITH DIFFERENTIAL/PLATELET
Basophils Absolute: 0 10*3/uL (ref 0.0–0.1)
Basophils Relative: 0 % (ref 0–1)
Eosinophils Absolute: 0.1 10*3/uL (ref 0.0–0.7)
Eosinophils Relative: 1 % (ref 0–5)
HCT: 35 % — ABNORMAL LOW (ref 36.0–46.0)
HEMOGLOBIN: 11.2 g/dL — AB (ref 12.0–15.0)
LYMPHS PCT: 29 % (ref 12–46)
Lymphs Abs: 3 10*3/uL (ref 0.7–4.0)
MCH: 23.4 pg — ABNORMAL LOW (ref 26.0–34.0)
MCHC: 32 g/dL (ref 30.0–36.0)
MCV: 73.2 fL — ABNORMAL LOW (ref 78.0–100.0)
MONO ABS: 0.7 10*3/uL (ref 0.1–1.0)
MONOS PCT: 7 % (ref 3–12)
NEUTROS ABS: 6.6 10*3/uL (ref 1.7–7.7)
NEUTROS PCT: 63 % (ref 43–77)
Platelets: 370 10*3/uL (ref 150–400)
RBC: 4.78 MIL/uL (ref 3.87–5.11)
RDW: 17.4 % — ABNORMAL HIGH (ref 11.5–15.5)
WBC: 10.5 10*3/uL (ref 4.0–10.5)

## 2013-07-17 LAB — HEMOGLOBIN A1C, FINGERSTICK: Hgb A1C (fingerstick): 6.4 % — ABNORMAL HIGH (ref <5.7)

## 2013-07-17 LAB — IRON: IRON: 18 ug/dL — AB (ref 42–145)

## 2013-07-17 NOTE — Progress Notes (Addendum)
Patient ID: Samantha Lucas, female   DOB: 04/02/1990, 23 y.o.   MRN: [id number]    Subjective:    Patient ID: Samantha Rippleavon R Castorena, female    DOB: 02/11/1990, 22 y.o.   MRN: [id number]  Patient presents for 3 month F/U  patient here to followup diabetes mellitus. She's had multiple episodes of hypoglycemia states when she takes the glipizide dropped her sugar very low especially when she's not had anything to eat. She starts shaking when it happens. She's been taking her metformin as prescribed as well. Her last A1c was 7.1%. She has gained some weight since her last visit because she stopped going to the gym. Denies any polyuria polydipsia. Her lipid panel is at goal. She's taking her ACE inhibitor as prescribed  Glucometer 30 day avg 115, 14 day avg 120   Fasting 50-120  , post meal 150-180  Review Of Systems:  GEN- denies fatigue, fever, weight loss,weakness, recent illness HEENT- denies eye drainage, change in vision, nasal discharge, CVS- denies chest pain, palpitations RESP- denies SOB, cough, wheeze ABD- denies N/V, change in stools, abd pain GU- denies dysuria, hematuria, dribbling, incontinence MSK- denies joint pain, muscle aches, injury Neuro- denies headache, dizziness, syncope, seizure activity       Objective:    BP 128/76  Pulse 84  Temp(Src) 98.5 F (36.9 C) (Oral)  Resp 16  Ht 5' 8.5" (1.74 m)  Wt 262 lb (118.842 kg)  BMI 39.25 kg/m2 GEN- NAD, alert and oriented x3 HEENT- PERRL, EOMI, non injected sclera, pink conjunctiva, MMM, oropharynx clear CVS- RRR, no murmur RESP-CTAB EXT- No edema Pulses- Radial, DP- 2+        Assessment & Plan:      Problem List Items Addressed This Visit   Type II or unspecified type diabetes mellitus without mention of complication, uncontrolled - Primary   Relevant Orders      CBC with Differential      Comprehensive metabolic panel      Hemoglobin A1C, fingerstick (Completed)   Obesity      Note: This dictation was prepared  with Dragon dictation along with smaller phrase technology. Any transcriptional errors that result from this process are unintentional.

## 2013-07-17 NOTE — Patient Instructions (Addendum)
Stop the glipizide Continue current medication Work on diet and weight loss  F/U 4 Months after 1 st week of October for PHYSICAL

## 2013-07-17 NOTE — Assessment & Plan Note (Signed)
Recheck CBC and iron level, continue supplement

## 2013-07-17 NOTE — Assessment & Plan Note (Signed)
Her A1c is improved to 6.4% however she's having hypoglycemia with the glipizide. Also with the weight gain I will go ahead and discontinue the glipizide and keep her on metformin 500 mg twice a day. They will call for sugars fasting start to trend greater than 150 fasting at that time we'll try her on Januvia she did not do well with increased doses of metformin  Note I have had a discussion with the patient as well as her mother was date off of the statin drug at this time

## 2013-07-18 ENCOUNTER — Other Ambulatory Visit: Payer: Self-pay | Admitting: *Deleted

## 2013-07-18 MED ORDER — FERROUS SULFATE 325 (65 FE) MG PO TABS: 325.0000 mg | ORAL_TABLET | Freq: Three times a day (TID) | ORAL | Status: DC

## 2013-11-01 ENCOUNTER — Encounter: Payer: BC Managed Care – PPO | Admitting: Family Medicine

## 2013-11-09 ENCOUNTER — Other Ambulatory Visit (INDEPENDENT_AMBULATORY_CARE_PROVIDER_SITE_OTHER): Payer: BC Managed Care – PPO | Admitting: *Deleted

## 2013-11-09 DIAGNOSIS — D509 Iron deficiency anemia, unspecified: Secondary | ICD-10-CM

## 2013-11-09 DIAGNOSIS — E119 Type 2 diabetes mellitus without complications: Secondary | ICD-10-CM

## 2013-11-09 DIAGNOSIS — Z23 Encounter for immunization: Secondary | ICD-10-CM

## 2013-11-09 DIAGNOSIS — Z0000 Encounter for general adult medical examination without abnormal findings: Secondary | ICD-10-CM

## 2013-11-09 DIAGNOSIS — I10 Essential (primary) hypertension: Secondary | ICD-10-CM

## 2013-11-09 LAB — COMPLETE METABOLIC PANEL WITH GFR
ALBUMIN: 3.8 g/dL (ref 3.5–5.2)
ALK PHOS: 107 U/L (ref 39–117)
ALT: 9 U/L (ref 0–35)
AST: 11 U/L (ref 0–37)
BUN: 7 mg/dL (ref 6–23)
CALCIUM: 9.5 mg/dL (ref 8.4–10.5)
CHLORIDE: 101 meq/L (ref 96–112)
CO2: 25 mEq/L (ref 19–32)
Creat: 0.69 mg/dL (ref 0.50–1.10)
GFR, Est African American: >89 mL/min
GFR, Est Non African American: >89 mL/min
Glucose, Bld: 129 mg/dL — ABNORMAL HIGH (ref 70–99)
POTASSIUM: 4.5 meq/L (ref 3.5–5.3)
SODIUM: 135 meq/L (ref 135–145)
TOTAL PROTEIN: 6.9 g/dL (ref 6.0–8.3)
Total Bilirubin: 0.4 mg/dL (ref 0.2–1.2)

## 2013-11-09 LAB — CBC WITH DIFFERENTIAL/PLATELET
Basophils Absolute: 0.1 10*3/uL (ref 0.0–0.1)
Basophils Relative: 1 % (ref 0–1)
Eosinophils Absolute: 0.2 10*3/uL (ref 0.0–0.7)
Eosinophils Relative: 2 % (ref 0–5)
HCT: 36.7 % (ref 36.0–46.0)
Hemoglobin: 11.7 g/dL — ABNORMAL LOW (ref 12.0–15.0)
LYMPHS ABS: 3.6 10*3/uL (ref 0.7–4.0)
LYMPHS PCT: 38 % (ref 12–46)
MCH: 24 pg — ABNORMAL LOW (ref 26.0–34.0)
MCHC: 31.9 g/dL (ref 30.0–36.0)
MCV: 75.2 fL — ABNORMAL LOW (ref 78.0–100.0)
Monocytes Absolute: 1 10*3/uL (ref 0.1–1.0)
Monocytes Relative: 10 % (ref 3–12)
NEUTROS PCT: 49 % (ref 43–77)
Neutro Abs: 4.7 10*3/uL (ref 1.7–7.7)
PLATELETS: 343 10*3/uL (ref 150–400)
RBC: 4.88 MIL/uL (ref 3.87–5.11)
RDW: 16.7 % — ABNORMAL HIGH (ref 11.5–15.5)
WBC: 9.5 10*3/uL (ref 4.0–10.5)

## 2013-11-09 LAB — LIPID PANEL
CHOL/HDL RATIO: 4.2 ratio
Cholesterol: 160 mg/dL (ref 0–200)
HDL: 38 mg/dL — ABNORMAL LOW (ref >39)
LDL CALC: 100 mg/dL — AB (ref 0–99)
Triglycerides: 111 mg/dL (ref <150)
VLDL: 22 mg/dL (ref 0–40)

## 2013-11-09 LAB — HEMOGLOBIN A1C
HEMOGLOBIN A1C: 8.3 % — AB (ref <5.7)
MEAN PLASMA GLUCOSE: 192 mg/dL — AB (ref <117)

## 2013-11-09 NOTE — Progress Notes (Signed)
Patient ID: Samantha Lucas, female   DOB: 06/17/1990, 23 y.o.   MRN: [id number] Patient seen in office for Influenza Vaccination.   Tolerated IM administration well.

## 2013-11-14 ENCOUNTER — Ambulatory Visit (INDEPENDENT_AMBULATORY_CARE_PROVIDER_SITE_OTHER): Payer: BC Managed Care – PPO | Admitting: Family Medicine

## 2013-11-14 ENCOUNTER — Encounter: Payer: Self-pay | Admitting: Family Medicine

## 2013-11-14 VITALS — BP 118/64 | HR 78 | Temp 98.6°F | Resp 16 | Ht 71.0 in | Wt 263.0 lb

## 2013-11-14 DIAGNOSIS — E119 Type 2 diabetes mellitus without complications: Secondary | ICD-10-CM

## 2013-11-14 DIAGNOSIS — E669 Obesity, unspecified: Secondary | ICD-10-CM

## 2013-11-14 DIAGNOSIS — Z124 Encounter for screening for malignant neoplasm of cervix: Secondary | ICD-10-CM

## 2013-11-14 DIAGNOSIS — Z0000 Encounter for general adult medical examination without abnormal findings: Secondary | ICD-10-CM

## 2013-11-14 MED ORDER — SITAGLIPTIN PHOSPHATE 100 MG PO TABS: 100.0000 mg | ORAL_TABLET | Freq: Every day | ORAL | Status: DC

## 2013-11-14 NOTE — Assessment & Plan Note (Signed)
Unable to obtain complete Pap smear she is a virgin and still had partial Gilcrease been noted we did do a line suite. We will try to repeat examination next year I think she is low risk for cervical cancer  Fasting labs were reviewed with her

## 2013-11-14 NOTE — Progress Notes (Signed)
Patient ID: Samantha Lucas, female   DOB: 01/24/1991, 23 y.o.   MRN: [id number]   Subjective:    Patient ID: Samantha Lucas, female    DOB: 01/15/1991, 23 y.o.   MRN: [id number]  Patient presents for CPE with PAP  patient here for complete physical exam and Pap smear. She is 23 years of age and is not sexually active. Her last menstrual period was the first week of October. She denies any vaginal discharge.  She is history of diabetes mellitus currently on metformin 500 mg twice a day I stop glipizide secondary to hypoglycemia. Her A1c returned at 8.3% her LDL and was at 100. She is on a low-dose ACE inhibitor for renal protection. She's not been watching her diet and states that she is carb overload the past couple months. Her grandfather did pass away a couple months which is been very difficult family as they live with the grandparents she actually took the fall semester off of school as well.  Seen by Eye doctor, [facility], new glasses   Review Of Systems:  GEN- denies fatigue, fever, weight loss,weakness, recent illness HEENT- denies eye drainage, change in vision, nasal discharge, CVS- denies chest pain, palpitations RESP- denies SOB, cough, wheeze ABD- denies N/V, change in stools, abd pain GU- denies dysuria, hematuria, dribbling, incontinence MSK- denies joint pain, muscle aches, injury Neuro- denies headache, dizziness, syncope, seizure activity       Objective:    BP 118/64  Pulse 78  Temp(Src) 98.6 F (37 C) (Oral)  Resp 16  Ht 5\' 11"  (1.803 m)  Wt 263 lb (119.296 kg)  BMI 36.70 kg/m2  LMP 10/26/2013 GEN- NAD, alert and oriented x3 HEENT- PERRL, EOMI, non injected sclera, pink conjunctiva, MMM, oropharynx clear Neck- Supple, no thyromegaly CVS- RRR, no murmur RESP-CTAB Breast- normal symmetry, no nipple inversion,no nipple drainage, no nodules or lumps felt Nodes- no axillary nodes ABD-NABS,soft,NT,ND GU- normal external genitalia, vaginal mucosa pink and  moist,post hymenal ring visualized, cervix visualized partially no blood form os, unable to complete exam due to pain, blind PAP Done  EXT- No edema Pulses- Radial, DP- 2+        Assessment & Plan:      Problem List Items Addressed This Visit   None    Visit Diagnoses   Screening for cervical cancer    -  Primary    Relevant Orders       PAP, ThinPrep ASCUS Rflx HPV Rflx Type       Note: This dictation was prepared with Dragon dictation along with smaller phrase technology. Any transcriptional errors that result from this process are unintentional.

## 2013-11-14 NOTE — Assessment & Plan Note (Signed)
Diabetes is uncontrolled goal A1c less than 7% however Januvia 100 mg have given her samples from the office. She did not tolerate Lundberg-dose metformin and had hypoglycemia with the glipizide She needs better dietary indiscretion

## 2013-11-14 NOTE — Patient Instructions (Addendum)
Start new medication Januvia once a day for diabetes Continue Metformin  Work on weight loss  F/U 3 months

## 2013-11-15 LAB — PAP THINPREP ASCUS RFLX HPV RFLX TYPE

## 2013-12-01 ENCOUNTER — Other Ambulatory Visit: Payer: Self-pay | Admitting: *Deleted

## 2013-12-01 MED ORDER — LISINOPRIL 2.5 MG PO TABS
2.5000 mg | ORAL_TABLET | Freq: Every day | ORAL | Status: DC
Start: 2013-12-01 — End: 2014-07-12

## 2013-12-01 NOTE — Telephone Encounter (Signed)
Received fax requesting refill on Lisinopril.   Refill appropriate and filled per protocol. 

## 2013-12-14 ENCOUNTER — Telehealth: Payer: Self-pay | Admitting: Family Medicine

## 2013-12-14 MED ORDER — SITAGLIPTIN PHOSPHATE 100 MG PO TABS: 100.0000 mg | ORAL_TABLET | Freq: Every day | ORAL | Status: DC

## 2013-12-14 NOTE — Telephone Encounter (Signed)
Patient is calling for refill on januvia  [facility] [place]  [phone]

## 2013-12-14 NOTE — Telephone Encounter (Signed)
Prescription sent to pharmacy.

## 2013-12-20 ENCOUNTER — Encounter: Payer: Self-pay | Admitting: Family Medicine

## 2014-01-09 ENCOUNTER — Other Ambulatory Visit: Payer: Self-pay | Admitting: Family Medicine

## 2014-01-10 NOTE — Telephone Encounter (Signed)
Refill appropriate and filled per protocol. 

## 2014-01-26 DIAGNOSIS — B3324 Viral cardiomyopathy: Secondary | ICD-10-CM

## 2014-01-26 HISTORY — DX: Viral cardiomyopathy: B33.24

## 2014-02-14 ENCOUNTER — Ambulatory Visit: Payer: BC Managed Care – PPO | Admitting: Family Medicine

## 2014-02-20 ENCOUNTER — Ambulatory Visit: Payer: Self-pay | Admitting: Family Medicine

## 2014-02-20 ENCOUNTER — Telehealth: Payer: Self-pay | Admitting: *Deleted

## 2014-02-20 NOTE — Telephone Encounter (Signed)
Received request from pharmacy for PA on Januvia.   PA submitted.   Dx: E11.9  Patient has tried and failed Metformin, Glipizide, and Lantus.

## 2014-02-21 ENCOUNTER — Ambulatory Visit: Payer: BC Managed Care – PPO | Admitting: Family Medicine

## 2014-02-22 NOTE — Telephone Encounter (Signed)
Received PA determination.   PA denied.   Patient must try and fail Onglyza.

## 2014-02-23 NOTE — Telephone Encounter (Signed)
Noted  

## 2014-02-23 NOTE — Telephone Encounter (Signed)
Note,d pt has appt next week, we will discuss changing med due to insurance, make sure she is fasting when she comes in

## 2014-02-27 ENCOUNTER — Encounter: Payer: Self-pay | Admitting: Family Medicine

## 2014-02-27 ENCOUNTER — Ambulatory Visit (INDEPENDENT_AMBULATORY_CARE_PROVIDER_SITE_OTHER): Payer: BLUE CROSS/BLUE SHIELD | Admitting: Family Medicine

## 2014-02-27 VITALS — BP 136/80 | HR 78 | Temp 99.2°F | Resp 16 | Ht 70.0 in | Wt 262.0 lb

## 2014-02-27 DIAGNOSIS — D509 Iron deficiency anemia, unspecified: Secondary | ICD-10-CM

## 2014-02-27 DIAGNOSIS — E669 Obesity, unspecified: Secondary | ICD-10-CM

## 2014-02-27 DIAGNOSIS — E119 Type 2 diabetes mellitus without complications: Secondary | ICD-10-CM

## 2014-02-27 MED ORDER — SAXAGLIPTIN HCL 5 MG PO TABS: 5.0000 mg | ORAL_TABLET | Freq: Every day | ORAL | Status: DC

## 2014-02-27 NOTE — Assessment & Plan Note (Signed)
She is very young.  Issues were to be more compliant with diet and Asked to increase her activity as she could come off of the oral medications for her diabetes I will need to change her Onglyza or Januvia is no longer preferred on her drug list. She will continue the metformin as well as a low-dose lisinopril. I'll also recheck her lipid panel. She is very young and that she needs a statin drug at this time.

## 2014-02-27 NOTE — Assessment & Plan Note (Signed)
Discussed dietacy changes, okay to use protein shake or smoothie for meal replacement

## 2014-02-27 NOTE — Progress Notes (Signed)
Patient ID: Thomasene Rippleavon R Noack, female   DOB: 10/20/1990, 24 y.o.   MRN: [id number]   Subjective:    Patient ID: Carles Colletavon R Clabaugh, female    DOB: 05/20/1990, 24 y.o.   MRN: [id number]  Patient presents for 3 month F/U  patient here for follow-up of diabetes mellitus. She is type 2 diabetes all along with her obesity. She has a strong family history of diabetes mellitus type 2. Her blood sugars range from 110 to 130s until the past week where she ran out of her Januvia and her fastings have been up to 170. She is still eating a lot of fast food N is also eating late at night. She has lost 1 pound since our last visit 3 months ago. She's not had any hypoglycemia symptoms. She is taking her lisinopril as prescribed she is also on iron tablets.    Review Of Systems:  GEN- denies fatigue, fever, weight loss,weakness, recent illness HEENT- denies eye drainage, change in vision, nasal discharge, CVS- denies chest pain, palpitations RESP- denies SOB, cough, wheeze ABD- denies N/V, change in stools, abd pain GU- denies dysuria, hematuria, dribbling, incontinence MSK- denies joint pain, muscle aches, injury Neuro- denies headache, dizziness, syncope, seizure activity       Objective:    BP 136/80 mmHg  Pulse 78  Temp(Src) 99.2 F (37.3 C) (Oral)  Resp 16  Ht 5\' 10"  (1.778 m)  Wt 262 lb (118.842 kg)  BMI 37.59 kg/m2  LMP 02/25/2014 (Exact Date) GEN- NAD, alert and oriented x3 HEENT- PERRL, EOMI, non injected sclera, pink conjunctiva, MMM, oropharynx clear CVS- RRR, no murmur RESP-CTAB EXT- No edema Pulses- Radial, DP- 2+        Assessment & Plan:      Problem List Items Addressed This Visit      Unprioritized   Obesity - Primary   Relevant Medications   saxagliptin (ONGLYZA) tablet   Microcytic anemia   Diabetes mellitus, type II   Relevant Medications   saxagliptin (ONGLYZA) tablet   Other Relevant Orders   CBC with Differential/Platelet   Comprehensive metabolic panel   Hemoglobin A1c   Lipid panel      Note: This dictation was prepared with Dragon dictation along with smaller phrase technology. Any transcriptional errors that result from this process are unintentional.

## 2014-02-27 NOTE — Patient Instructions (Addendum)
New diabetes medication Onglyza in place of Januvia F/U 3 months

## 2014-02-28 LAB — LIPID PANEL
Cholesterol: 159 mg/dL (ref 0–200)
HDL: 43 mg/dL (ref >39)
LDL Cholesterol: 104 mg/dL — ABNORMAL HIGH (ref 0–99)
Total CHOL/HDL Ratio: 3.7 Ratio
Triglycerides: 59 mg/dL (ref <150)
VLDL: 12 mg/dL (ref 0–40)

## 2014-02-28 LAB — COMPREHENSIVE METABOLIC PANEL
ALBUMIN: 3.6 g/dL (ref 3.5–5.2)
ALT: 8 U/L (ref 0–35)
AST: 13 U/L (ref 0–37)
Alkaline Phosphatase: 113 U/L (ref 39–117)
BILIRUBIN TOTAL: 0.4 mg/dL (ref 0.2–1.2)
BUN: 8 mg/dL (ref 6–23)
CHLORIDE: 103 meq/L (ref 96–112)
CO2: 28 mEq/L (ref 19–32)
Calcium: 9.2 mg/dL (ref 8.4–10.5)
Creat: 0.61 mg/dL (ref 0.50–1.10)
GLUCOSE: 135 mg/dL — AB (ref 70–99)
Potassium: 4.6 mEq/L (ref 3.5–5.3)
Sodium: 137 mEq/L (ref 135–145)
Total Protein: 7.3 g/dL (ref 6.0–8.3)

## 2014-02-28 LAB — CBC WITH DIFFERENTIAL/PLATELET
Basophils Absolute: 0 10*3/uL (ref 0.0–0.1)
Basophils Relative: 0 % (ref 0–1)
EOS PCT: 1 % (ref 0–5)
Eosinophils Absolute: 0.1 10*3/uL (ref 0.0–0.7)
HEMATOCRIT: 36.8 % (ref 36.0–46.0)
Hemoglobin: 11.5 g/dL — ABNORMAL LOW (ref 12.0–15.0)
LYMPHS PCT: 34 % (ref 12–46)
Lymphs Abs: 3.3 10*3/uL (ref 0.7–4.0)
MCH: 24.2 pg — ABNORMAL LOW (ref 26.0–34.0)
MCHC: 31.3 g/dL (ref 30.0–36.0)
MCV: 77.3 fL — AB (ref 78.0–100.0)
MONO ABS: 0.6 10*3/uL (ref 0.1–1.0)
MPV: 9.5 fL (ref 8.6–12.4)
Monocytes Relative: 6 % (ref 3–12)
NEUTROS PCT: 59 % (ref 43–77)
Neutro Abs: 5.7 10*3/uL (ref 1.7–7.7)
Platelets: 369 10*3/uL (ref 150–400)
RBC: 4.76 MIL/uL (ref 3.87–5.11)
RDW: 16.8 % — ABNORMAL HIGH (ref 11.5–15.5)
WBC: 9.6 10*3/uL (ref 4.0–10.5)

## 2014-02-28 LAB — HEMOGLOBIN A1C
Hgb A1c MFr Bld: 7.4 % — ABNORMAL HIGH (ref <5.7)
Mean Plasma Glucose: 166 mg/dL — ABNORMAL HIGH (ref <117)

## 2014-05-08 ENCOUNTER — Telehealth: Payer: Self-pay | Admitting: Family Medicine

## 2014-05-08 NOTE — Telephone Encounter (Signed)
Has been sick x 3 days.  Started with nausea/vomiting.  Now has a lot of diarrhea.  Can't eat.  Has been using Pedialyte.  Very weak..  In bathroom at least once an hour.  Can you call something or need to see??

## 2014-05-08 NOTE — Telephone Encounter (Signed)
Sound like a gastroenteritis, she can use imodium, gaterade/gingerale If her blood sugar is normal, she can stay at home, if elevated > 200 come in for visit urgently or go to ER

## 2014-05-08 NOTE — Telephone Encounter (Signed)
Pt called and given provider recommendations.  BS is not over 200 today.

## 2014-05-17 ENCOUNTER — Inpatient Hospital Stay (HOSPITAL_COMMUNITY)
Admission: EM | Admit: 2014-05-17 | Discharge: 2014-05-19 | DRG: 292 | Disposition: A | Discharge status: Home / Self Care | Payer: BLUE CROSS/BLUE SHIELD | Attending: Internal Medicine | Admitting: Internal Medicine

## 2014-05-17 ENCOUNTER — Emergency Department (HOSPITAL_COMMUNITY): Payer: BLUE CROSS/BLUE SHIELD

## 2014-05-17 ENCOUNTER — Encounter (HOSPITAL_COMMUNITY): Payer: Self-pay | Admitting: Cardiology

## 2014-05-17 DIAGNOSIS — I5021 Acute systolic (congestive) heart failure: Secondary | ICD-10-CM

## 2014-05-17 DIAGNOSIS — Z803 Family history of malignant neoplasm of breast: Secondary | ICD-10-CM | POA: Diagnosis not present

## 2014-05-17 DIAGNOSIS — Z8249 Family history of ischemic heart disease and other diseases of the circulatory system: Secondary | ICD-10-CM | POA: Diagnosis not present

## 2014-05-17 DIAGNOSIS — M419 Scoliosis, unspecified: Secondary | ICD-10-CM | POA: Diagnosis present

## 2014-05-17 DIAGNOSIS — Z6841 Body Mass Index (BMI) 40.0 and over, adult: Secondary | ICD-10-CM

## 2014-05-17 DIAGNOSIS — T502X5A Adverse effect of carbonic-anhydrase inhibitors, benzothiadiazides and other diuretics, initial encounter: Secondary | ICD-10-CM | POA: Diagnosis present

## 2014-05-17 DIAGNOSIS — E876 Hypokalemia: Secondary | ICD-10-CM | POA: Diagnosis present

## 2014-05-17 DIAGNOSIS — D509 Iron deficiency anemia, unspecified: Secondary | ICD-10-CM | POA: Diagnosis present

## 2014-05-17 DIAGNOSIS — Z823 Family history of stroke: Secondary | ICD-10-CM

## 2014-05-17 DIAGNOSIS — I509 Heart failure, unspecified: Secondary | ICD-10-CM | POA: Diagnosis not present

## 2014-05-17 DIAGNOSIS — E669 Obesity, unspecified: Secondary | ICD-10-CM | POA: Diagnosis present

## 2014-05-17 DIAGNOSIS — R0602 Shortness of breath: Secondary | ICD-10-CM | POA: Diagnosis not present

## 2014-05-17 DIAGNOSIS — E119 Type 2 diabetes mellitus without complications: Secondary | ICD-10-CM | POA: Diagnosis present

## 2014-05-17 DIAGNOSIS — E8770 Fluid overload, unspecified: Secondary | ICD-10-CM | POA: Diagnosis not present

## 2014-05-17 DIAGNOSIS — E1165 Type 2 diabetes mellitus with hyperglycemia: Secondary | ICD-10-CM

## 2014-05-17 DIAGNOSIS — Z833 Family history of diabetes mellitus: Secondary | ICD-10-CM | POA: Diagnosis not present

## 2014-05-17 DIAGNOSIS — E1100 Type 2 diabetes mellitus with hyperosmolarity without nonketotic hyperglycemic-hyperosmolar coma (NKHHC): Secondary | ICD-10-CM | POA: Diagnosis not present

## 2014-05-17 LAB — CBC WITH DIFFERENTIAL/PLATELET
Basophils Absolute: 0 10*3/uL (ref 0.0–0.1)
Basophils Relative: 0 % (ref 0–1)
EOS ABS: 0.2 10*3/uL (ref 0.0–0.7)
Eosinophils Relative: 2 % (ref 0–5)
HCT: 31.2 % — ABNORMAL LOW (ref 36.0–46.0)
HEMOGLOBIN: 9.9 g/dL — AB (ref 12.0–15.0)
LYMPHS ABS: 3.7 10*3/uL (ref 0.7–4.0)
LYMPHS PCT: 32 % (ref 12–46)
MCH: 24.8 pg — AB (ref 26.0–34.0)
MCHC: 31.7 g/dL (ref 30.0–36.0)
MCV: 78 fL (ref 78.0–100.0)
Monocytes Absolute: 0.8 10*3/uL (ref 0.1–1.0)
Monocytes Relative: 7 % (ref 3–12)
NEUTROS PCT: 59 % (ref 43–77)
Neutro Abs: 6.8 10*3/uL (ref 1.7–7.7)
Platelets: 334 10*3/uL (ref 150–400)
RBC: 4 MIL/uL (ref 3.87–5.11)
RDW: 15.8 % — ABNORMAL HIGH (ref 11.5–15.5)
WBC: 11.5 10*3/uL — AB (ref 4.0–10.5)

## 2014-05-17 LAB — BASIC METABOLIC PANEL
ANION GAP: 7 (ref 5–15)
BUN: 8 mg/dL (ref 6–23)
CHLORIDE: 107 mmol/L (ref 96–112)
CO2: 24 mmol/L (ref 19–32)
Calcium: 8.5 mg/dL (ref 8.4–10.5)
Creatinine, Ser: 0.67 mg/dL (ref 0.50–1.10)
GFR calc Af Amer: >90 mL/min (ref >90)
Glucose, Bld: 107 mg/dL — ABNORMAL HIGH (ref 70–99)
Potassium: 3.1 mmol/L — ABNORMAL LOW (ref 3.5–5.1)
Sodium: 138 mmol/L (ref 135–145)

## 2014-05-17 LAB — URINALYSIS, ROUTINE W REFLEX MICROSCOPIC
Bilirubin Urine: NEGATIVE
Glucose, UA: NEGATIVE mg/dL
HGB URINE DIPSTICK: NEGATIVE
Ketones, ur: NEGATIVE mg/dL
Leukocytes, UA: NEGATIVE
NITRITE: NEGATIVE
Protein, ur: NEGATIVE mg/dL
UROBILINOGEN UA: 0.2 mg/dL (ref 0.0–1.0)
pH: 6 (ref 5.0–8.0)

## 2014-05-17 LAB — PREGNANCY, URINE: PREG TEST UR: NEGATIVE

## 2014-05-17 LAB — TROPONIN I: Troponin I: <0.03 ng/mL (ref <0.031)

## 2014-05-17 LAB — GLUCOSE, CAPILLARY: Glucose-Capillary: 78 mg/dL (ref 70–99)

## 2014-05-17 LAB — D-DIMER, QUANTITATIVE: D-Dimer, Quant: 0.89 ug/mL-FEU — ABNORMAL HIGH (ref 0.00–0.48)

## 2014-05-17 LAB — BRAIN NATRIURETIC PEPTIDE
B Natriuretic Peptide: 145 pg/mL — ABNORMAL HIGH (ref 0.0–100.0)
B Natriuretic Peptide: 142 pg/mL — ABNORMAL HIGH (ref 0.0–100.0)

## 2014-05-17 LAB — TSH: TSH: 3.126 u[IU]/mL (ref 0.350–4.500)

## 2014-05-17 MED ORDER — INSULIN ASPART 100 UNIT/ML ~~LOC~~ SOLN: 0.0000 [IU] | Freq: Every day | SUBCUTANEOUS | Status: DC

## 2014-05-17 MED ORDER — INSULIN ASPART 100 UNIT/ML ~~LOC~~ SOLN
0.0000 [IU] | Freq: Three times a day (TID) | SUBCUTANEOUS | Status: DC
  Administered 2014-05-18 – 2014-05-19 (×3): 3 [IU] via SUBCUTANEOUS

## 2014-05-17 MED ORDER — ONDANSETRON HCL 4 MG/2ML IJ SOLN
4.0000 mg | Freq: Four times a day (QID) | INTRAMUSCULAR | Status: DC | PRN
  Administered 2014-05-18: 4 mg via INTRAVENOUS
  Filled 2014-05-17: qty 2

## 2014-05-17 MED ORDER — IOHEXOL 350 MG/ML SOLN
100.0000 mL | Freq: Once | INTRAVENOUS | Status: AC | PRN
  Administered 2014-05-17: 100 mL via INTRAVENOUS

## 2014-05-17 MED ORDER — POTASSIUM CHLORIDE CRYS ER 20 MEQ PO TBCR
40.0000 meq | EXTENDED_RELEASE_TABLET | Freq: Every day | ORAL | Status: DC
  Administered 2014-05-17 – 2014-05-18 (×2): 40 meq via ORAL
  Filled 2014-05-17 (×4): qty 2

## 2014-05-17 MED ORDER — ACETAMINOPHEN 325 MG PO TABS
650.0000 mg | ORAL_TABLET | ORAL | Status: DC | PRN
  Administered 2014-05-18 (×4): 650 mg via ORAL
  Filled 2014-05-17 (×4): qty 2

## 2014-05-17 MED ORDER — FUROSEMIDE 10 MG/ML IJ SOLN
40.0000 mg | Freq: Two times a day (BID) | INTRAMUSCULAR | Status: DC
  Administered 2014-05-17 – 2014-05-19 (×4): 40 mg via INTRAVENOUS
  Filled 2014-05-17 (×4): qty 4

## 2014-05-17 MED ORDER — LISINOPRIL 5 MG PO TABS
2.5000 mg | ORAL_TABLET | Freq: Every day | ORAL | Status: DC
  Administered 2014-05-17 – 2014-05-19 (×3): 2.5 mg via ORAL
  Filled 2014-05-17 (×3): qty 1

## 2014-05-17 MED ORDER — HEPARIN SODIUM (PORCINE) 5000 UNIT/ML IJ SOLN
5000.0000 [IU] | Freq: Three times a day (TID) | INTRAMUSCULAR | Status: DC
  Administered 2014-05-17 – 2014-05-19 (×5): 5000 [IU] via SUBCUTANEOUS
  Filled 2014-05-17 (×5): qty 1

## 2014-05-17 MED ORDER — ASPIRIN 81 MG PO CHEW: 324.0000 mg | CHEWABLE_TABLET | Freq: Once | ORAL | Status: DC

## 2014-05-17 MED ORDER — FERROUS SULFATE 325 (65 FE) MG PO TABS
325.0000 mg | ORAL_TABLET | Freq: Three times a day (TID) | ORAL | Status: DC
  Administered 2014-05-18 – 2014-05-19 (×4): 325 mg via ORAL
  Filled 2014-05-17 (×4): qty 1

## 2014-05-17 MED ORDER — SODIUM CHLORIDE 0.9 % IJ SOLN
3.0000 mL | Freq: Two times a day (BID) | INTRAMUSCULAR | Status: DC
  Administered 2014-05-17 – 2014-05-18 (×3): 3 mL via INTRAVENOUS

## 2014-05-17 NOTE — ED Notes (Addendum)
Sob,  chest pressure and  feet  Swelling  Since  yesterday.

## 2014-05-17 NOTE — ED Notes (Signed)
[facility] all questions answered.

## 2014-05-17 NOTE — Progress Notes (Signed)
Pt arrived to unit in stable condition. Pt A&O. Pt denies pain at this time. Pt denies any trouble breathing. O2 saturation 99% on RA. Nurse educated pt on new diagnosis of CHF. HF Booklet given. Pt has no questions right now. Will continue to monitor pt frequently throughout night. Bed remains in lowest position and call bell is within reach. Family remains at bedside.

## 2014-05-17 NOTE — H&P (Signed)
[facility] History and Physical  Samantha Lucas [id number] DOB: 02/22/1990 DOA: 05/17/2014  Referring physician: ER PCP: Milinda AntisURHAM, KAWANTA, MD   Chief Complaint: Dyspnea  HPI: Samantha Lucas is a 24 y.o. female  This is a 24 year old lady who presents with 24-hour history of dyspnea. She says that she was more short of breath on lying down. She also describes swelling in her feet and a sense of chest pressure since yesterday although not frank chest pain. Approximately one week ago she had a viral type illness consisting of body aches, subjective fever and some diarrhea. She denies any cough, palpitations, current fever or limb weakness. She seemed to improve from this illness after 3 days. She is diabetic. She is obese. Evaluation in the emergency room so far is consistent with congestive heart failure. She is now being admitted for further management.   Review of Systems:   Apart from symptoms above, all systems are negative.  Past Medical History  Diagnosis Date  . Scoliosis (and kyphoscoliosis), idiopathic   . Diabetes mellitus without complication May 2014   Past Surgical History  Procedure Laterality Date  . Wisdom tooth extraction  dec 2012   Social History:  reports that she has never smoked. She has never used smokeless tobacco. She reports that she does not drink alcohol or use illicit drugs.  No Known Allergies  Family History  Problem Relation Age of Onset  . Hypertension Mother   . Diabetes Mother   . Stroke Father   . Hypertension Father   . Cancer Paternal Grandmother     lung and breast cancer  . Hypertension Paternal Grandmother     Prior to Admission medications   Medication Sig Start Date End Date Taking? Authorizing Provider  ferrous sulfate 325 (65 FE) MG tablet Take 1 tablet (325 mg total) by mouth 3 (three) times daily with meals. 07/18/13  Yes Salley ScarletKawanta F [place], MD  ibuprofen (ADVIL,MOTRIN) 200 MG tablet Take 200 mg by mouth every 6 (six) hours  as needed for mild pain or moderate pain.   Yes Historical Provider, MD  lisinopril (ZESTRIL) 2.5 MG tablet Take 1 tablet (2.5 mg total) by mouth daily. To protect kidneys 12/01/13  Yes Salley ScarletKawanta F [place], MD  metFORMIN (GLUCOPHAGE) 500 MG tablet TAKE 1 TABLET BY MOUTH TWICE DAILY WITH A MEAL 01/10/14  Yes Salley ScarletKawanta F [place], MD  Multiple Vitamin (MULITIVITAMIN WITH MINERALS) TABS Take 1 tablet by mouth daily.   Yes Historical Provider, MD  saxagliptin HCl (ONGLYZA) 5 MG TABS tablet Take 1 tablet (5 mg total) by mouth daily. For diabetes Patient not taking: Reported on 05/17/2014 02/27/14   Salley ScarletKawanta F [place], MD   Physical Exam: Filed Vitals:   05/17/14 1446  BP: 148/76  Pulse: 52  Temp: 98.7 F (37.1 C)  TempSrc: Oral  Resp: 18  Height: 5\' 10"  (1.778 m)  Weight: 125.193 kg (276 lb)  SpO2: 100%    Wt Readings from Last 3 Encounters:  05/17/14 125.193 kg (276 lb)  02/27/14 118.842 kg (262 lb)  11/14/13 119.296 kg (263 lb)    General:  Appears calm and comfortable. She does not appear to be dyspneic at rest. There is no increased work of breathing. Eyes: PERRL, normal lids, irises & conjunctiva ENT: grossly normal hearing, lips & tongue Neck: no LAD, masses or thyromegaly Cardiovascular: Possible gallop rhythm. Jugular venous pressure is not elevated. There is peripheral pitting edema in her lower legs. Telemetry: SR, no arrhythmias  Respiratory: Both  bases are somewhat dull to percussion. There is reduced breath sounds at the bases. There are no crackles or wheezes. Abdomen: soft, ntnd Skin: no rash or induration seen on limited exam Musculoskeletal: grossly normal tone BUE/BLE Psychiatric: grossly normal mood and affect, speech fluent and appropriate Neurologic: grossly non-focal.          Labs on Admission:  Basic Metabolic Panel:  Recent Labs Lab 05/17/14 1609  NA 138  K 3.1*  CL 107  CO2 24  GLUCOSE 107*  BUN 8  CREATININE 0.67  CALCIUM 8.5   Liver Function  Tests: No results for input(s): AST, ALT, ALKPHOS, BILITOT, PROT, ALBUMIN in the last 168 hours. No results for input(s): LIPASE, AMYLASE in the last 168 hours. No results for input(s): AMMONIA in the last 168 hours. CBC:  Recent Labs Lab 05/17/14 1609  WBC 11.5*  NEUTROABS 6.8  HGB 9.9*  HCT 31.2*  MCV 78.0  PLT 334   Cardiac Enzymes:  Recent Labs Lab 05/17/14 1609  TROPONINI <0.03    BNP (last 3 results)  Recent Labs  05/17/14 1609  BNP 145.0*    ProBNP (last 3 results) No results for input(s): PROBNP in the last 8760 hours.  CBG: No results for input(s): GLUCAP in the last 168 hours.  Radiological Exams on Admission: Dg Chest 2 View  05/17/2014   CLINICAL DATA:  Two days of nonproductive cough associated with shortness of breath and chest congestion, history of diabetes  EXAM: CHEST  2 VIEW  COMPARISON:  Thoracic spine series of March 25, 2011  FINDINGS: The lungs are adequately inflated. The interstitial markings are increased bilaterally. There is no alveolar infiltrate. There is a small amount of pleural fluid on the left posteriorly. The cardiac silhouette is top-normal in size. The central pulmonary vascularity is mildly prominent. The trachea is midline. The bony thorax exhibits no acute abnormality.  IMPRESSION: Bibasilar subsegmental atelectasis versus early interstitial pneumonia. Follow-up radiographs following anticipated antibiotic therapy are recommended to assure clearing.   Electronically Signed   By: David  [place] M.D.   On: 05/17/2014 15:49   Ct Angio Chest Pe W/cm &/or Wo Cm  05/17/2014   CLINICAL DATA:  Shortness of breath. Chest pressure. Swelling of the feet. History of diabetes. Evaluate for pulmonary embolism.  EXAM: CT ANGIOGRAPHY CHEST WITH CONTRAST  TECHNIQUE: Multidetector CT imaging of the chest was performed using the standard protocol during bolus administration of intravenous contrast. Multiplanar CT image reconstructions and MIPs were  obtained to evaluate the vascular anatomy.  CONTRAST:  OMNIPAQUE IOHEXOL 350 MG/ML SOLN  COMPARISON:  Chest radiograph-earlier same day  FINDINGS: Vascular Findings:  There is adequate opacification of the pulmonary arterial system with the main pulmonary artery measuring 289 Hounsfield units. There no discrete filling defects within the pulmonary arterial tree to the level of the bilateral subsegmental pulmonary arteries. Evaluation of the distal subsegmental pulmonary arteries is degraded secondary to a combination of patient respiratory artifact and quantum mottle due to patient body habitus.  Borderline enlarged caliber the main pulmonary artery measuring 31 mm in diameter.  Cardiomegaly. No pericardial effusion. Normal caliber of the thoracic aorta. Bovine configuration of the aortic arch is incidentally noted. The branch vessels of the arch appear widely patent throughout their imaged course. No thoracic aortic dissection or periaortic stranding.  Review of the MIP images confirms the above findings.   ----------------------------------------------------------------------------------  Nonvascular Findings:  Evaluation of the pulmonary parenchyma is degraded secondary to a combination of patient  respiratory artifact and quantum mottle due to patient body habitus.  Small bilateral pleural effusions. Minimal dependent subpleural ground-glass atelectasis. No discrete focal airspace opacities. The central pulmonary airways appear patent. No pneumothorax.  There is a minimal amount of ill-defined soft tissue within the anterior mediastinum which is favored to represent residual thymic tissue.  No discrete pulmonary nodules given limitation of the examination.  Shotty bilateral axillary lymph nodes are individually not enlarged by size criteria and presumably reactive due to patient body habitus. No mediastinal, hilar axillary lymphadenopathy.  Limited early arterial phase evaluation of the upper abdomen is  normal. No acute or aggressive osseous abnormalities.  Regional soft tissues appear normal.  IMPRESSION: 1. No evidence of pulmonary embolism. 2. Cardiomegaly with trace bilateral effusions - constellation of findings suggestive of early pulmonary edema. Clinical correlation is advised. 3. Borderline enlarged caliber of the main pulmonary artery, nonspecific though could be seen in the setting of pulmonary arterial hypertension. Further evaluation with cardiac echo could be performed as clinically indicated.   Electronically Signed   By: Simonne Come M.D.   On: 05/17/2014 18:02    EKG: Independently reviewed. Normal sinus rhythm with some nonspecific ST-T wave changes.  Assessment/Plan Active Problems:   CHF (congestive heart failure)   Obesity   Diabetes mellitus, type II   Congestive heart failure   1. Congestive heart failure. This is probably systolic in nature. I wonder whether she has a viral cardiomyopathy accounting for her symptoms. We will obtain echocardiogram. We will ask cardiology consultation. She will be started on intravenous Lasix for symptomatic relief and reduction of edema in her lungs and legs. She is oriented and ACE inhibitor. 2. Diabetes. Hold metformin. Sliding scale of insulin. 3. Obesity.  Further recommendations will depend on patient's hospital progress.   Code Status: Full code.   DVT Prophylaxis: Heparin.  Family Communication: I discussed the plan with the patient at the bedside.   Disposition Plan: Home when medically stable.   Time spent: 60 minutes.  Wilson Singer [facility] Pager [phone].

## 2014-05-17 NOTE — ED Provider Notes (Signed)
CSN: [id number]     Arrival date & time 05/17/14  1435 History   First MD Initiated Contact with Patient 05/17/14 1547     Chief Complaint  Patient presents with  . Shortness of Breath     (Consider location/radiation/quality/duration/timing/severity/associated sxs/prior Treatment) HPI Comments: Patient with congestion, shortness of breath that is worse with lying down, swelling in her feet, chest pressure since yesterday. She is worried that these are caused by allergies. She is worried about swelling in her bilateral feet. She denies any chest pain but has felt pressure and tightness since yesterday. She's had a nonproductive cough and runny nose. Denies any dental pain, nausea or vomiting. No fever. No recent travel or birth control use. History of a diabetic controlled on by mouth meds. Denies any possibility of pregnancy. Denies any focal weakness, numbness or tingling.  The history is provided by the patient and a relative.    Past Medical History  Diagnosis Date  . Scoliosis (and kyphoscoliosis), idiopathic   . Diabetes mellitus without complication May 2014   Past Surgical History  Procedure Laterality Date  . Wisdom tooth extraction  dec 2012   Family History  Problem Relation Age of Onset  . Hypertension Mother   . Diabetes Mother   . Stroke Father   . Hypertension Father   . Cancer Paternal Grandmother     lung and breast cancer  . Hypertension Paternal Grandmother    History  Substance Use Topics  . Smoking status: Never Smoker   . Smokeless tobacco: Never Used  . Alcohol Use: No   OB History    No data available     Review of Systems  Constitutional: Negative for fever, activity change, appetite change and fatigue.  HENT: Positive for congestion and rhinorrhea.   Respiratory: Positive for cough and shortness of breath.   Cardiovascular: Negative for chest pain.  Gastrointestinal: Negative for nausea, vomiting and abdominal pain.  Genitourinary: Negative  for dysuria, hematuria, vaginal bleeding and vaginal discharge.  Musculoskeletal: Positive for myalgias and arthralgias. Negative for back pain and neck pain.  Skin: Negative for rash.  Neurological: Negative for dizziness and headaches.  A complete 10 system review of systems was obtained and all systems are negative except as noted in the HPI and PMH.      Allergies  Review of patient's allergies indicates no known allergies.  Home Medications   Prior to Admission medications   Medication Sig Start Date End Date Taking? Authorizing Provider  ferrous sulfate 325 (65 FE) MG tablet Take 1 tablet (325 mg total) by mouth 3 (three) times daily with meals. 07/18/13  Yes Salley Scarlet, MD  ibuprofen (ADVIL,MOTRIN) 200 MG tablet Take 200 mg by mouth every 6 (six) hours as needed for mild pain or moderate pain.   Yes Historical Provider, MD  lisinopril (ZESTRIL) 2.5 MG tablet Take 1 tablet (2.5 mg total) by mouth daily. To protect kidneys 12/01/13  Yes Salley Scarlet, MD  metFORMIN (GLUCOPHAGE) 500 MG tablet TAKE 1 TABLET BY MOUTH TWICE DAILY WITH A MEAL 01/10/14  Yes Salley Scarlet, MD  Multiple Vitamin (MULITIVITAMIN WITH MINERALS) TABS Take 1 tablet by mouth daily.   Yes Historical Provider, MD  saxagliptin HCl (ONGLYZA) 5 MG TABS tablet Take 1 tablet (5 mg total) by mouth daily. For diabetes Patient not taking: Reported on 05/17/2014 02/27/14   Salley Scarlet, MD   BP 129/61 mmHg  Pulse 61  Temp(Src) 98.9 F (37.2 C) (Oral)  Resp 16  Ht  (1.778 m)  Wt 278 lb 1.6 oz (126.145 kg)  BMI 39.90 kg/m2  SpO2 100%  LMP 05/11/2014 Physical Exam  Constitutional: She is oriented to person, place, and time. She appears well-developed and well-nourished. No distress.  HENT:  Head: Normocephalic and atraumatic.  Mouth/Throat: Oropharynx is clear and moist. No oropharyngeal exudate.  Eyes: Conjunctivae and EOM are normal. Pupils are equal, round, and reactive to light.  Neck: Normal  range of motion. Neck supple.  No meningismus.  Cardiovascular: Normal rate, regular rhythm, normal heart sounds and intact distal pulses.   No murmur heard. Pulmonary/Chest: Effort normal and breath sounds normal. No respiratory distress. She has no wheezes. She exhibits no tenderness.  Decreased breath sounds at bases  Abdominal: Soft. There is no tenderness. There is no rebound and no guarding.  Musculoskeletal: Normal range of motion. She exhibits edema. She exhibits no tenderness.  Trace pedal edema bilaterally  Neurological: She is alert and oriented to person, place, and time. No cranial nerve deficit. She exhibits normal muscle tone. Coordination normal.  No ataxia on finger to nose bilaterally. No pronator drift. 5/5 strength throughout. CN 2-12 intact. Negative Romberg. Equal grip strength. Sensation intact. Gait is normal.   Skin: Skin is warm.  Psychiatric: She has a normal mood and affect. Her behavior is normal.  Nursing note and vitals reviewed.   ED Course  Procedures (including critical care time) Labs Review Labs Reviewed  CBC WITH DIFFERENTIAL/PLATELET - Abnormal; Notable for the following:    WBC 11.5 (*)    Hemoglobin 9.9 (*)    HCT 31.2 (*)    MCH 24.8 (*)    RDW 15.8 (*)    All other components within normal limits  BASIC METABOLIC PANEL - Abnormal; Notable for the following:    Potassium 3.1 (*)    Glucose, Bld 107 (*)    All other components within normal limits  BRAIN NATRIURETIC PEPTIDE - Abnormal; Notable for the following:    B Natriuretic Peptide 145.0 (*)    All other components within normal limits  D-DIMER, QUANTITATIVE - Abnormal; Notable for the following:    D-Dimer, Quant 0.89 (*)    All other components within normal limits  URINALYSIS, ROUTINE W REFLEX MICROSCOPIC - Abnormal; Notable for the following:    Specific Gravity, Urine >1.030 (*)    All other components within normal limits  BRAIN NATRIURETIC PEPTIDE - Abnormal; Notable for the  following:    B Natriuretic Peptide 142.0 (*)    All other components within normal limits  TROPONIN I  PREGNANCY, URINE  TSH  TROPONIN I  GLUCOSE, CAPILLARY  COMPREHENSIVE METABOLIC PANEL  TROPONIN I  TROPONIN I    Imaging Review Dg Chest 2 View  05/17/2014   CLINICAL DATA:  Two days of nonproductive cough associated with shortness of breath and chest congestion, history of diabetes  EXAM: CHEST  2 VIEW  COMPARISON:  Thoracic spine series of March 25, 2011  FINDINGS: The lungs are adequately inflated. The interstitial markings are increased bilaterally. There is no alveolar infiltrate. There is a small amount of pleural fluid on the left posteriorly. The cardiac silhouette is top-normal in size. The central pulmonary vascularity is mildly prominent. The trachea is midline. The bony thorax exhibits no acute abnormality.  IMPRESSION: Bibasilar subsegmental atelectasis versus early interstitial pneumonia. Follow-up radiographs following anticipated antibiotic therapy are recommended to assure clearing.   Electronically Signed   By: David  [place] M.D.  On: 05/17/2014 15:49   Ct Angio Chest Pe W/cm &/or Wo Cm  05/17/2014   CLINICAL DATA:  Shortness of breath. Chest pressure. Swelling of the feet. History of diabetes. Evaluate for pulmonary embolism.  EXAM: CT ANGIOGRAPHY CHEST WITH CONTRAST  TECHNIQUE: Multidetector CT imaging of the chest was performed using the standard protocol during bolus administration of intravenous contrast. Multiplanar CT image reconstructions and MIPs were obtained to evaluate the vascular anatomy.  CONTRAST:  100mL OMNIPAQUE IOHEXOL 350 MG/ML SOLN  COMPARISON:  Chest radiograph-earlier same day  FINDINGS: Vascular Findings:  There is adequate opacification of the pulmonary arterial system with the main pulmonary artery measuring 289 Hounsfield units. There no discrete filling defects within the pulmonary arterial tree to the level of the bilateral subsegmental pulmonary  arteries. Evaluation of the distal subsegmental pulmonary arteries is degraded secondary to a combination of patient respiratory artifact and quantum mottle due to patient body habitus.  Borderline enlarged caliber the main pulmonary artery measuring 31 mm in diameter.  Cardiomegaly. No pericardial effusion. Normal caliber of the thoracic aorta. Bovine configuration of the aortic arch is incidentally noted. The branch vessels of the arch appear widely patent throughout their imaged course. No thoracic aortic dissection or periaortic stranding.  Review of the MIP images confirms the above findings.   ----------------------------------------------------------------------------------  Nonvascular Findings:  Evaluation of the pulmonary parenchyma is degraded secondary to a combination of patient respiratory artifact and quantum mottle due to patient body habitus.  Small bilateral pleural effusions. Minimal dependent subpleural ground-glass atelectasis. No discrete focal airspace opacities. The central pulmonary airways appear patent. No pneumothorax.  There is a minimal amount of ill-defined soft tissue within the anterior mediastinum which is favored to represent residual thymic tissue.  No discrete pulmonary nodules given limitation of the examination.  Shotty bilateral axillary lymph nodes are individually not enlarged by size criteria and presumably reactive due to patient body habitus. No mediastinal, hilar axillary lymphadenopathy.  Limited early arterial phase evaluation of the upper abdomen is normal. No acute or aggressive osseous abnormalities.  Regional soft tissues appear normal.  IMPRESSION: 1. No evidence of pulmonary embolism. 2. Cardiomegaly with trace bilateral effusions - constellation of findings suggestive of early pulmonary edema. Clinical correlation is advised. 3. Borderline enlarged caliber of the main pulmonary artery, nonspecific though could be seen in the setting of pulmonary arterial  hypertension. Further evaluation with cardiac echo could be performed as clinically indicated.   Electronically Signed   By: Simonne ComeJohn  Watts M.D.   On: 05/17/2014 18:02     EKG Interpretation   Date/Time:  Thursday May 17 2014 14:51:36 EDT Ventricular Rate:  52 PR Interval:  158 QRS Duration: 92 QT Interval:  400 QTC Calculation: 372 R Axis:   1 Text Interpretation:  Sinus bradycardia with sinus arrhythmia Left  ventricular hypertrophy Nonspecific ST and T wave abnormality Abnormal ECG  No previous ECGs available Confirmed by Jimi Giza  MD, Avrey Hyser [id number]) on  05/17/2014 3:49:04 PM      MDM   Final diagnoses:  SOB (shortness of breath)   Shortness of breath with congestion and chest pressure since yesterday. Minimal feet swelling on exam. She has x-ray shows possible interstitial infiltrate. No hypoxia or increased work of breathing. EKG sinus bradycardia with nonspecific T-wave inversions.  Chest x-ray as above. EKG is abnormal. D-dimer and BNP will be sent.  CT shows no evidence of PE but does so cardiomegaly with pleural effusions and pulmonary edema. Pulmonary artery hypertension noted as well.  Patient denies any history of pregnancy. She is not hypoxic and in no distress. 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 Discussed with Dr. Karilyn Cota who will admit   Glynn Octave, MD 05/17/14 2351

## 2014-05-17 NOTE — ED Notes (Signed)
Pulse ox 98% with ambulation  

## 2014-05-18 DIAGNOSIS — I509 Heart failure, unspecified: Secondary | ICD-10-CM

## 2014-05-18 DIAGNOSIS — E876 Hypokalemia: Secondary | ICD-10-CM

## 2014-05-18 DIAGNOSIS — E1100 Type 2 diabetes mellitus with hyperosmolarity without nonketotic hyperglycemic-hyperosmolar coma (NKHHC): Secondary | ICD-10-CM

## 2014-05-18 DIAGNOSIS — D509 Iron deficiency anemia, unspecified: Secondary | ICD-10-CM

## 2014-05-18 LAB — COMPREHENSIVE METABOLIC PANEL
ALK PHOS: 106 U/L (ref 39–117)
ALT: 21 U/L (ref 0–35)
ANION GAP: 6 (ref 5–15)
AST: 18 U/L (ref 0–37)
Albumin: 3.1 g/dL — ABNORMAL LOW (ref 3.5–5.2)
BUN: 7 mg/dL (ref 6–23)
CO2: 26 mmol/L (ref 19–32)
Calcium: 8.3 mg/dL — ABNORMAL LOW (ref 8.4–10.5)
Chloride: 107 mmol/L (ref 96–112)
Creatinine, Ser: 0.7 mg/dL (ref 0.50–1.10)
GFR calc Af Amer: >90 mL/min (ref >90)
GFR calc non Af Amer: >90 mL/min (ref >90)
Glucose, Bld: 146 mg/dL — ABNORMAL HIGH (ref 70–99)
POTASSIUM: 3.2 mmol/L — AB (ref 3.5–5.1)
SODIUM: 139 mmol/L (ref 135–145)
TOTAL PROTEIN: 6.3 g/dL (ref 6.0–8.3)
Total Bilirubin: 0.5 mg/dL (ref 0.3–1.2)

## 2014-05-18 LAB — GLUCOSE, CAPILLARY
GLUCOSE-CAPILLARY: 134 mg/dL — AB (ref 70–99)
GLUCOSE-CAPILLARY: 169 mg/dL — AB (ref 70–99)
Glucose-Capillary: 105 mg/dL — ABNORMAL HIGH (ref 70–99)
Glucose-Capillary: 124 mg/dL — ABNORMAL HIGH (ref 70–99)

## 2014-05-18 LAB — TROPONIN I: Troponin I: <0.03 ng/mL (ref <0.031)

## 2014-05-18 MED ORDER — ZOLPIDEM TARTRATE 5 MG PO TABS
5.0000 mg | ORAL_TABLET | Freq: Once | ORAL | Status: AC
  Administered 2014-05-18: 5 mg via ORAL
  Filled 2014-05-18: qty 1

## 2014-05-18 NOTE — Consult Note (Signed)
CARDIOLOGY CONSULT NOTE       Patient ID: Samantha Lucas MRN: [id number] DOB/AGE: 16-Sep-1990 24 y.o.  Admit date: 05/17/2014 Referring Physician:  Catha Gosselin Primary Physician: Milinda Antis, MD Primary Cardiologist:  New Will follow in [place] Reason for Consultation:  CHF  Active Problems:   Obesity   Diabetes mellitus, type II   CHF (congestive heart failure)   Congestive heart failure   HPI:   24 yo obese diabetic admitted with clinical syndrome consistent with CHF.  No previous cardiac history Been diabetic over a year.  Initially on insulin now just oral agent.  No history of cardiac issues.  One week URI illness with fever, myalgias, dyspnea, nausea and vomiting.  No chest pain.  Compliant with meds.  In ER BNP 145  CXR and CT consistent with pulmonary vascular congestin.  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  Improved with lasix.  Much better this am.  No other family members ill and no family history of DCM  Patient does not have connective tissue disease or arthritis No history of chronic lung disease or asthma  Denies drugs or excess ETOH  TSH normal at 3.1    ROS All other systems reviewed and negative except as noted above  Past Medical History  Diagnosis Date  . Scoliosis (and kyphoscoliosis), idiopathic   . Diabetes mellitus without complication May 2014    Family History  Problem Relation Age of Onset  . Hypertension Mother   . Diabetes Mother   . Stroke Father   . Hypertension Father   . Cancer Paternal Grandmother     lung and breast cancer  . Hypertension Paternal Grandmother     History   Social History  . Marital Status: Single    Spouse Name: N/A  . Number of Children: N/A  . Years of Education: N/A   Occupational History  . Not on file.   Social History Main Topics  . Smoking status: Never Smoker   . Smokeless tobacco: Never Used  . Alcohol Use: No  . Drug Use: No  . Sexual Activity: No    Other Topics Concern  . Not on file   Social History Narrative    Past Surgical History  Procedure Laterality Date  . Wisdom tooth extraction  dec 2012     . ferrous sulfate  325 mg Oral TID WC  . furosemide  40 mg Intravenous Q12H  . heparin  5,000 Units Subcutaneous 3 times per day  . insulin aspart  0-20 Units Subcutaneous TID WC  . insulin aspart  0-5 Units Subcutaneous QHS  . lisinopril  2.5 mg Oral Daily  . potassium chloride  40 mEq Oral Daily  . sodium chloride  3 mL Intravenous Q12H      Physical Exam: Blood pressure 131/71, pulse 75, temperature 98.1 F (36.7 C), temperature source Oral, resp. rate 16, height  (1.778 m), weight 278 lb 1.6 oz (126.145 kg), last menstrual period 05/11/2014, SpO2 100 %.    Affect appropriate Obese black female  HEENT: normal Neck supple with no adenopathy JVP normal no bruits no thyromegaly Lungs clear with no wheezing and good diaphragmatic motion Heart:  S1/S2 no murmur, no rub, gallop or click PMI normal Abdomen: benighn, BS positve, no tenderness, no AAA no bruit.  No HSM or HJR Distal pulses intact with no bruits No edema Neuro non-focal Skin warm and dry No  muscular weakness   Labs:   Lab Results  Component Value Date   WBC 11.5* 05/17/2014   HGB 9.9* 05/17/2014   HCT 31.2* 05/17/2014   MCV 78.0 05/17/2014   PLT 334 05/17/2014    Recent Labs Lab 05/18/14 0200  NA 139  K 3.2*  CL 107  CO2 26  BUN 7  CREATININE 0.70  CALCIUM 8.3*  PROT 6.3  BILITOT 0.5  ALKPHOS 106  ALT 21  AST 18  GLUCOSE 146*   Lab Results  Component Value Date   TROPONINI <0.03 05/18/2014    Lab Results  Component Value Date   CHOL 159 02/27/2014   CHOL 160 11/09/2013   CHOL 137 04/14/2013   Lab Results  Component Value Date   HDL 43 02/27/2014   HDL 38* 11/09/2013   HDL 38* 04/14/2013   Lab Results  Component Value Date   LDLCALC 104* 02/27/2014   LDLCALC 100* 11/09/2013   LDLCALC 83 04/14/2013   Lab  Results  Component Value Date   TRIG 59 02/27/2014   TRIG 111 11/09/2013   TRIG 81 04/14/2013   Lab Results  Component Value Date   CHOLHDL 3.7 02/27/2014   CHOLHDL 4.2 11/09/2013   CHOLHDL 3.6 04/14/2013   No results found for: LDLDIRECT    Radiology: Dg Chest 2 View  05/17/2014   CLINICAL DATA:  Two days of nonproductive cough associated with shortness of breath and chest congestion, history of diabetes  EXAM: CHEST  2 VIEW  COMPARISON:  Thoracic spine series of March 25, 2011  FINDINGS: The lungs are adequately inflated. The interstitial markings are increased bilaterally. There is no alveolar infiltrate. There is a small amount of pleural fluid on the left posteriorly. The cardiac silhouette is top-normal in size. The central pulmonary vascularity is mildly prominent. The trachea is midline. The bony thorax exhibits no acute abnormality.  IMPRESSION: Bibasilar subsegmental atelectasis versus early interstitial pneumonia. Follow-up radiographs following anticipated antibiotic therapy are recommended to assure clearing.   Electronically Signed   By: David  [place] M.D.   On: 05/17/2014 15:49   Ct Angio Chest Pe W/cm &/or Wo Cm  05/17/2014   CLINICAL DATA:  Shortness of breath. Chest pressure. Swelling of the feet. History of diabetes. Evaluate for pulmonary embolism.  EXAM: CT ANGIOGRAPHY CHEST WITH CONTRAST  TECHNIQUE: Multidetector CT imaging of the chest was performed using the standard protocol during bolus administration of intravenous contrast. Multiplanar CT image reconstructions and MIPs were obtained to evaluate the vascular anatomy.  CONTRAST:  OMNIPAQUE IOHEXOL 350 MG/ML SOLN  COMPARISON:  Chest radiograph-earlier same day  FINDINGS: Vascular Findings:  There is adequate opacification of the pulmonary arterial system with the main pulmonary artery measuring 289 Hounsfield units. There no discrete filling defects within the pulmonary arterial tree to the level of the bilateral  subsegmental pulmonary arteries. Evaluation of the distal subsegmental pulmonary arteries is degraded secondary to a combination of patient respiratory artifact and quantum mottle due to patient body habitus.  Borderline enlarged caliber the main pulmonary artery measuring 31 mm in diameter.  Cardiomegaly. No pericardial effusion. Normal caliber of the thoracic aorta. Bovine configuration of the aortic arch is incidentally noted. The branch vessels of the arch appear widely patent throughout their imaged course. No thoracic aortic dissection or periaortic stranding.  Review of the MIP images confirms the above findings.   ----------------------------------------------------------------------------------  Nonvascular Findings:  Evaluation of the pulmonary parenchyma is degraded secondary to a combination of patient  respiratory artifact and quantum mottle due to patient body habitus.  Small bilateral pleural effusions. Minimal dependent subpleural ground-glass atelectasis. No discrete focal airspace opacities. The central pulmonary airways appear patent. No pneumothorax.  There is a minimal amount of ill-defined soft tissue within the anterior mediastinum which is favored to represent residual thymic tissue.  No discrete pulmonary nodules given limitation of the examination.  Shotty bilateral axillary lymph nodes are individually not enlarged by size criteria and presumably reactive due to patient body habitus. No mediastinal, hilar axillary lymphadenopathy.  Limited early arterial phase evaluation of the upper abdomen is normal. No acute or aggressive osseous abnormalities.  Regional soft tissues appear normal.  IMPRESSION: 1. No evidence of pulmonary embolism. 2. Cardiomegaly with trace bilateral effusions - constellation of findings suggestive of early pulmonary edema. Clinical correlation is advised. 3. Borderline enlarged caliber of the main pulmonary artery, nonspecific though could be seen in the setting of  pulmonary arterial hypertension. Further evaluation with cardiac echo could be performed as clinically indicated.   Electronically Signed   By: Simonne ComeJohn  Watts M.D.   On: 05/17/2014 18:02    EKG:  SR poor R wave progression T wave inversion lead 3   ASSESSMENT AND PLAN:  CHF:  With CE on CXR possible DCM related to recent viral illness.  Echo ordered for today.  Increase lisinopril to 5 mg  Continue lasix iv bid and transition to PO daily in am Ideally would have cardiac MRI but not done at [facility].   DM:  Continue oral agents discussed low carb diet Obesity:  Discussed exercise and diet issues  Relationship to DM also discussed  She does have some good insight and motivation now that "her heart has acted up"  Signed: Charlton Hawseter Shauntae Reitman 05/18/2014, 8:36 AM

## 2014-05-18 NOTE — Progress Notes (Signed)
  Echocardiogram 2D Echocardiogram has been performed.  Stacey DrainWhite, Oaklyn Mans J 05/18/2014, 3:26 PM

## 2014-05-18 NOTE — Progress Notes (Signed)
UR chart review completed.  

## 2014-05-18 NOTE — Progress Notes (Signed)
Pt and family asked nurse to page MD for something to help pt rest. Pt states she has been unable to sleep and wants to know if the MD will give her something to help her rest. Awaiting MDs response.

## 2014-05-18 NOTE — Care Management Note (Signed)
    Page 1 of 1   05/18/2014     11:34:01 AM CARE MANAGEMENT NOTE 05/18/2014  Patient:  Samantha Lucas,Samantha Lucas   Account Number:  [id number]  Date Initiated:  05/18/2014  Documentation initiated by:  Sharrie RothmanBLACKWELL,Nevin Grizzle C  Subjective/Objective Assessment:   Pt admitted from home with CHF. Pt lives with family and will return home at discharge. Pt is independent with ADL's.     Action/Plan:   No CM needs noted.   Anticipated DC Date:  05/20/2014   Anticipated DC Plan:  HOME/SELF CARE      DC Planning Services  CM consult      Choice offered to / List presented to:             Status of service:  Completed, signed off Medicare Important Message given?   (If response is "NO", the following Medicare IM given date fields will be blank) Date Medicare IM given:   Medicare IM given by:   Date Additional Medicare IM given:   Additional Medicare IM given by:    Discharge Disposition:  HOME/SELF CARE  Per UR Regulation:    If discussed at Long Length of Stay Meetings, dates discussed:    Comments:  05/18/14 1130 Arlyss Queenammy Tami Barren, RN BSN CM

## 2014-05-18 NOTE — Progress Notes (Signed)
[facility]                                                                              Patient Demographics  Samantha Lucas, is a 24 y.o. female, DOB - 07-22-1990, EAV:[id number]  Admit date - 05/17/2014   Admitting Physician Wilson Singer, MD  Outpatient Primary MD for the patient is Milinda Antis, MD  LOS - 1   Chief Complaint  Patient presents with  . Shortness of Breath      HPI on 05/17/2014 by Dr. Lilly Cove This is a 24 year old lady who presents with 24-hour history of dyspnea. She says that she was more short of breath on lying down. She also describes swelling in her feet and a sense of chest pressure since yesterday although not frank chest pain. Approximately one week ago she had a viral type illness consisting of body aches, subjective fever and some diarrhea. She denies any cough, palpitations, current fever or limb weakness. She seemed to improve from this illness after 3 days. She is diabetic. She is obese. Evaluation in the emergency room so far is consistent with congestive heart failure. She is now being admitted for further management.  Assessment & Plan   New onset CHF -CT Angio: No PE, Cardiomegaly with trace bilateral pleural effusions, early pulm edema -BNP 145 -Echocardiogram pending -Continue diuresis, monitor weight, intake and output -Cardiology consulted -lipid panel on 02/27/2014: TC 159, TG 59, HDL 43, LDL 104 -TSH 3.126  Diabetes Mellitus, type II -Continue ISS and CBG monitoring -home medications onglyza and metformin held -Last HbA1c 7.4 (02/27/2014)  Obesity, BMI 40 -Counseled on diet and exercise  Hypokalemia -Will replace and continue to monitor BMP -Likely secondary to diuresis  Chronic Microcytic Anemia -Continue iron supplementation and monitor CBC  Code Status: Full  Family Communication: Sister at bedside  Disposition Plan: Admitted, pending workup  Time Spent in minutes   30 minutes  Procedures   None  Consults   Cardiology  DVT Prophylaxis  heparin  Lab Results  Component Value Date   PLT 334 05/17/2014    Medications  Scheduled Meds: . ferrous sulfate  325 mg Oral TID WC  . furosemide  40 mg Intravenous Q12H  . heparin  5,000 Units Subcutaneous 3 times per day  . insulin aspart  0-20 Units Subcutaneous TID WC  . insulin aspart  0-5 Units Subcutaneous QHS  . lisinopril  2.5 mg Oral Daily  . potassium chloride  40 mEq Oral Daily  . sodium chloride  3 mL Intravenous Q12H   Continuous Infusions:  PRN Meds:.acetaminophen, ondansetron (ZOFRAN) IV  Antibiotics    Anti-infectives    None      Subjective:   Samantha Lucas seen and examined today.  Patient states she is feeling better and feels her breathing is back to normal.  She denies further leg swelling.  She denies cough, chest pain, dizziness, headache, abdominal pain, N/V/C/D.    Objective:   Filed Vitals:   05/17/14 2020 05/18/14 0552 05/18/14 0953 05/18/14 1119  BP: 129/61 131/71  135/54  Pulse: 61 75    Temp: 98.9 F (37.2 C) 98.1 F (36.7 C)    TempSrc:  Oral Oral    Resp: 16 16    Height: 5\' 10"  (1.778 m)     Weight: 126.145 kg (278 lb 1.6 oz)  119.069 kg (262 lb 8 oz)   SpO2: 100% 100%      Wt Readings from Last 3 Encounters:  05/18/14 119.069 kg (262 lb 8 oz)  02/27/14 118.842 kg (262 lb)  11/14/13 119.296 kg (263 lb)     Intake/Output Summary (Last 24 hours) at 05/18/14 1350 Last data filed at 05/18/14 1000  Gross per 24 hour  Intake    720 ml  Output   5300 ml  Net  -4580 ml    Exam  General: Well developed, well nourished, NAD, appears stated age  HEENT: NCAT, mucous membranes moist.   Cardiovascular: S1 S2 auscultated, no rubs, murmurs or gallops. Regular rate and rhythm.  Respiratory: Clear to auscultation bilaterally with equal chest rise  Abdomen: Soft, nontender, nondistended, + bowel sounds  Extremities: warm dry without cyanosis clubbing or edema  Neuro: AAOx3,  nonfocal  Skin: Without rashes exudates or nodules  Psych: Normal affect and demeanor with intact judgement and insight  Data Review   Micro Results No results found for this or any previous visit (from the past 240 hour(s)).  Radiology Reports Dg Chest 2 View  05/17/2014   CLINICAL DATA:  Two days of nonproductive cough associated with shortness of breath and chest congestion, history of diabetes  EXAM: CHEST  2 VIEW  COMPARISON:  Thoracic spine series of March 25, 2011  FINDINGS: The lungs are adequately inflated. The interstitial markings are increased bilaterally. There is no alveolar infiltrate. There is a small amount of pleural fluid on the left posteriorly. The cardiac silhouette is top-normal in size. The central pulmonary vascularity is mildly prominent. The trachea is midline. The bony thorax exhibits no acute abnormality.  IMPRESSION: Bibasilar subsegmental atelectasis versus early interstitial pneumonia. Follow-up radiographs following anticipated antibiotic therapy are recommended to assure clearing.   Electronically Signed   By: David  Swazil[place]Jordan M.D.   On: 05/17/2014 15:49   Ct Angio Chest Pe W/cm &/or Wo Cm  05/17/2014   CLINICAL DATA:  Shortness of breath. Chest pressure. Swelling of the feet. History of diabetes. Evaluate for pulmonary embolism.  EXAM: CT ANGIOGRAPHY CHEST WITH CONTRAST  TECHNIQUE: Multidetector CT imaging of the chest was performed using the standard protocol during bolus administration of intravenous contrast. Multiplanar CT image reconstructions and MIPs were obtained to evaluate the vascular anatomy.  CONTRAST:  100mL OMNIPAQUE IOHEXOL 350 MG/ML SOLN  COMPARISON:  Chest radiograph-earlier same day  FINDINGS: Vascular Findings:  There is adequate opacification of the pulmonary arterial system with the main pulmonary artery measuring 289 Hounsfield units. There no discrete filling defects within the pulmonary arterial tree to the level of the bilateral  subsegmental pulmonary arteries. Evaluation of the distal subsegmental pulmonary arteries is degraded secondary to a combination of patient respiratory artifact and quantum mottle due to patient body habitus.  Borderline enlarged caliber the main pulmonary artery measuring 31 mm in diameter.  Cardiomegaly. No pericardial effusion. Normal caliber of the thoracic aorta. Bovine configuration of the aortic arch is incidentally noted. The branch vessels of the arch appear widely patent throughout their imaged course. No thoracic aortic dissection or periaortic stranding.  Review of the MIP images confirms the above findings.   ----------------------------------------------------------------------------------  Nonvascular Findings:  Evaluation of the pulmonary parenchyma is degraded secondary to a combination of patient respiratory artifact and quantum mottle due  to patient body habitus.  Small bilateral pleural effusions. Minimal dependent subpleural ground-glass atelectasis. No discrete focal airspace opacities. The central pulmonary airways appear patent. No pneumothorax.  There is a minimal amount of ill-defined soft tissue within the anterior mediastinum which is favored to represent residual thymic tissue.  No discrete pulmonary nodules given limitation of the examination.  Shotty bilateral axillary lymph nodes are individually not enlarged by size criteria and presumably reactive due to patient body habitus. No mediastinal, hilar axillary lymphadenopathy.  Limited early arterial phase evaluation of the upper abdomen is normal. No acute or aggressive osseous abnormalities.  Regional soft tissues appear normal.  IMPRESSION: 1. No evidence of pulmonary embolism. 2. Cardiomegaly with trace bilateral effusions - constellation of findings suggestive of early pulmonary edema. Clinical correlation is advised. 3. Borderline enlarged caliber of the main pulmonary artery, nonspecific though could be seen in the setting of  pulmonary arterial hypertension. Further evaluation with cardiac echo could be performed as clinically indicated.   Electronically Signed   By: Simonne Come M.D.   On: 05/17/2014 18:02    CBC  Recent Labs Lab 05/17/14 1609  WBC 11.5*  HGB 9.9*  HCT 31.2*  PLT 334  MCV 78.0  MCH 24.8*  MCHC 31.7  RDW 15.8*  LYMPHSABS 3.7  MONOABS 0.8  EOSABS 0.2  BASOSABS 0.0    Chemistries   Recent Labs Lab 05/17/14 1609 05/18/14 0200  NA 138 139  K 3.1* 3.2*  CL 107 107  CO2 24 26  GLUCOSE 107* 146*  BUN 8 7  CREATININE 0.67 0.70  CALCIUM 8.5 8.3*  AST  --  18  ALT  --  21  ALKPHOS  --  106  BILITOT  --  0.5   ------------------------------------------------------------------------------------------------------------------ estimated creatinine clearance is 155.8 mL/min (by C-G formula based on Cr of 0.7). ------------------------------------------------------------------------------------------------------------------ No results for input(s): HGBA1C in the last 72 hours. ------------------------------------------------------------------------------------------------------------------ No results for input(s): CHOL, HDL, LDLCALC, TRIG, CHOLHDL, LDLDIRECT in the last 72 hours. ------------------------------------------------------------------------------------------------------------------  Recent Labs  05/17/14 1609  TSH 3.126   ------------------------------------------------------------------------------------------------------------------ No results for input(s): VITAMINB12, FOLATE, FERRITIN, TIBC, IRON, RETICCTPCT in the last 72 hours.  Coagulation profile No results for input(s): INR, PROTIME in the last 168 hours.   Recent Labs  05/17/14 1609  DDIMER 0.89*    Cardiac Enzymes  Recent Labs Lab 05/17/14 1954 05/18/14 0200 05/18/14 0700  TROPONINI <0.03 <0.03 <0.03    ------------------------------------------------------------------------------------------------------------------ Invalid input(s): POCBNP    Jaton Eilers D.O. on 05/18/2014 at 1:50 PM  Between 7am to 7pm - Pager - [phone]  After 7pm go to [email or web address] - password [email or web address]  And look for the night coverage person covering for me after hours  [facility] Office  [phone]

## 2014-05-19 DIAGNOSIS — E8770 Fluid overload, unspecified: Secondary | ICD-10-CM

## 2014-05-19 LAB — CBC
HEMATOCRIT: 34.1 % — AB (ref 36.0–46.0)
Hemoglobin: 10.7 g/dL — ABNORMAL LOW (ref 12.0–15.0)
MCH: 24.4 pg — AB (ref 26.0–34.0)
MCHC: 31.4 g/dL (ref 30.0–36.0)
MCV: 77.9 fL — ABNORMAL LOW (ref 78.0–100.0)
Platelets: 337 10*3/uL (ref 150–400)
RBC: 4.38 MIL/uL (ref 3.87–5.11)
RDW: 15.9 % — AB (ref 11.5–15.5)
WBC: 9.5 10*3/uL (ref 4.0–10.5)

## 2014-05-19 LAB — BASIC METABOLIC PANEL
ANION GAP: 9 (ref 5–15)
BUN: 7 mg/dL (ref 6–23)
CALCIUM: 8.9 mg/dL (ref 8.4–10.5)
CHLORIDE: 104 mmol/L (ref 96–112)
CO2: 28 mmol/L (ref 19–32)
CREATININE: 0.68 mg/dL (ref 0.50–1.10)
Glucose, Bld: 119 mg/dL — ABNORMAL HIGH (ref 70–99)
Potassium: 3.3 mmol/L — ABNORMAL LOW (ref 3.5–5.1)
Sodium: 141 mmol/L (ref 135–145)

## 2014-05-19 LAB — GLUCOSE, CAPILLARY: GLUCOSE-CAPILLARY: 136 mg/dL — AB (ref 70–99)

## 2014-05-19 MED ORDER — POTASSIUM CHLORIDE CRYS ER 20 MEQ PO TBCR
40.0000 meq | EXTENDED_RELEASE_TABLET | Freq: Once | ORAL | Status: AC
  Administered 2014-05-19: 40 meq via ORAL

## 2014-05-19 MED ORDER — FUROSEMIDE 20 MG PO TABS: 20.0000 mg | ORAL_TABLET | Freq: Every day | ORAL | Status: DC | PRN

## 2014-05-19 NOTE — Progress Notes (Signed)
Pt called nurse complaining of iv hurting her arm and that her right hand was swelling. Pt asked nurse to remove iv due to significant pain. Nurse removed iv from pt's arm. Site was clean and dry. Applied heat pack to extremity. Pt requested nurse come back later to attempt new iv. Nurse came back and pt was asleep. Family asked nurse to come back in the a.m. and not wake pt. Will continue to monitor pt.

## 2014-05-19 NOTE — Discharge Instructions (Signed)

## 2014-05-19 NOTE — Discharge Summary (Addendum)
Physician Discharge Summary  Samantha Lucas ZOX:[id number] DOB: [birth year] DOA: 05/17/2014  PCP: Milinda Antis, MD  Admit date: 05/17/2014 Discharge date: 05/19/2014  Time spent: 45 minutes  Recommendations for Outpatient Follow-up:  Patient will be discharged to home.  Patient will need to follow up with primary care provider within one week of discharge and have repeat BMP.  Patient should continue medications as prescribed.  Patient should follow a heart healthy/ Carb modified diet.   Discharge Diagnoses:  Hypervolemia Diabetes mellitus, type II Obesity Hypokalemia Chronic microcytic anemia  Discharge Condition: Stable  Diet recommendation: Carb modified/heart healthy  Filed Weights   05/17/14 2020 05/18/14 0953 05/19/14 0659  Weight: 126.145 kg (278 lb 1.6 oz) 119.069 kg (262 lb 8 oz) 117.028 kg (258 lb)    History of present illness:  on 05/17/2014 by Dr. Lilly Cove This is a 24 year old lady who presents with 24-hour history of dyspnea. She says that she was more short of breath on lying down. She also describes swelling in her feet and a sense of chest pressure since yesterday although not frank chest pain. Approximately one week ago she had a viral type illness consisting of body aches, subjective fever and some diarrhea. She denies any cough, palpitations, current fever or limb weakness. She seemed to improve from this illness after 3 days. She is diabetic. She is obese. Evaluation in the emergency room so far is consistent with congestive heart failure. She is now being admitted for further management.  Hospital Course:  Hypervolemia  -CT Angio: No PE, Cardiomegaly with trace bilateral pleural effusions, early pulm edema -BNP 145 -Echocardiogram: EF 55-60%, normal diastolic function, no PFO -Monitored weight, intake and output -Cardiology consulted and appreciated -lipid panel on 02/27/2014: TC 159, TG 59, HDL 43, LDL 104 -TSH 3.126  Diabetes Mellitus, type  II -Placed on ISS and CBG monitoring during hospitalization -home medications onglyza and metformin held- but may continue upon discharge -Last HbA1c 7.4 (02/27/2014) -No history of HTN, likely on Lisinopril for renal protection   Obesity, BMI 40 -Counseled on diet and exercise  Hypokalemia -Replaced, patient should follow up with PCP within one week, will need repeat BMP -Likely secondary to diuresis  Chronic Microcytic Anemia -Continue iron supplementation and monitor CBC -Hb stable 10.7  Procedures  Echocardiogram: EF 55-60%, normal diastolic function, no PFO  Consults  Cardiology  Discharge Exam: Filed Vitals:   05/19/14 0659  BP: 119/68  Pulse: 85  Temp: 97.7 F (36.5 C)  Resp: 18   Exam  General: Well developed, well nourished, no distress  HEENT: NCAT, mucous membranes moist.   Cardiovascular: S1 S2 auscultated, RRR, no murmurs  Respiratory: Clear to auscultation bilaterally with equal chest rise  Abdomen: Soft, nontender, nondistended, + bowel sounds  Extremities: warm dry without cyanosis clubbing or edema  Neuro: AAOx3, nonfocal  Psych: Normal affect and demeanor, pleasant   Discharge Instructions      Discharge Instructions    Discharge instructions    Complete by:  As directed   Patient will be discharged to home.  Patient will need to follow up with primary care provider within one week of discharge and have repeat BMP.  Patient should continue medications as prescribed.  Patient should follow a Carb modified diet.            Medication List    TAKE these medications        ferrous sulfate 325 (65 FE) MG tablet  Take 1 tablet (325 mg total)  by mouth 3 (three) times daily with meals.     furosemide 20 MG tablet  Commonly known as:  LASIX  Take 1 tablet (20 mg total) by mouth daily as needed for fluid or edema.     ibuprofen 200 MG tablet  Commonly known as:  ADVIL,MOTRIN  Take 200 mg by mouth every 6 (six) hours as needed for  mild pain or moderate pain.     lisinopril 2.5 MG tablet  Commonly known as:  ZESTRIL  Take 1 tablet (2.5 mg total) by mouth daily. To protect kidneys     metFORMIN 500 MG tablet  Commonly known as:  GLUCOPHAGE  TAKE 1 TABLET BY MOUTH TWICE DAILY WITH A MEAL     multivitamin with minerals Tabs tablet  Take 1 tablet by mouth daily.     saxagliptin HCl 5 MG Tabs tablet  Commonly known as:  ONGLYZA  Take 1 tablet (5 mg total) by mouth daily. For diabetes       No Known Allergies    The results of significant diagnostics from this hospitalization (including imaging, microbiology, ancillary and laboratory) are listed below for reference.    Significant Diagnostic Studies: Dg Chest 2 View  05/17/2014   CLINICAL DATA:  Two days of nonproductive cough associated with shortness of breath and chest congestion, history of diabetes  EXAM: CHEST  2 VIEW  COMPARISON:  Thoracic spine series of March 25, 2011  FINDINGS: The lungs are adequately inflated. The interstitial markings are increased bilaterally. There is no alveolar infiltrate. There is a small amount of pleural fluid on the left posteriorly. The cardiac silhouette is top-normal in size. The central pulmonary vascularity is mildly prominent. The trachea is midline. The bony thorax exhibits no acute abnormality.  IMPRESSION: Bibasilar subsegmental atelectasis versus early interstitial pneumonia. Follow-up radiographs following anticipated antibiotic therapy are recommended to assure clearing.   Electronically Signed   By: David  [place] M.D.   On: 05/17/2014 15:49   Ct Angio Chest Pe W/cm &/or Wo Cm  05/17/2014   CLINICAL DATA:  Shortness of breath. Chest pressure. Swelling of the feet. History of diabetes. Evaluate for pulmonary embolism.  EXAM: CT ANGIOGRAPHY CHEST WITH CONTRAST  TECHNIQUE: Multidetector CT imaging of the chest was performed using the standard protocol during bolus administration of intravenous contrast. Multiplanar CT  image reconstructions and MIPs were obtained to evaluate the vascular anatomy.  CONTRAST:  100mL OMNIPAQUE IOHEXOL 350 MG/ML SOLN  COMPARISON:  Chest radiograph-earlier same day  FINDINGS: Vascular Findings:  There is adequate opacification of the pulmonary arterial system with the main pulmonary artery measuring 289 Hounsfield units. There no discrete filling defects within the pulmonary arterial tree to the level of the bilateral subsegmental pulmonary arteries. Evaluation of the distal subsegmental pulmonary arteries is degraded secondary to a combination of patient respiratory artifact and quantum mottle due to patient body habitus.  Borderline enlarged caliber the main pulmonary artery measuring 31 mm in diameter.  Cardiomegaly. No pericardial effusion. Normal caliber of the thoracic aorta. Bovine configuration of the aortic arch is incidentally noted. The branch vessels of the arch appear widely patent throughout their imaged course. No thoracic aortic dissection or periaortic stranding.  Review of the MIP images confirms the above findings.   ----------------------------------------------------------------------------------  Nonvascular Findings:  Evaluation of the pulmonary parenchyma is degraded secondary to a combination of patient respiratory artifact and quantum mottle due to patient body habitus.  Small bilateral pleural effusions. Minimal dependent subpleural ground-glass atelectasis. No discrete  focal airspace opacities. The central pulmonary airways appear patent. No pneumothorax.  There is a minimal amount of ill-defined soft tissue within the anterior mediastinum which is favored to represent residual thymic tissue.  No discrete pulmonary nodules given limitation of the examination.  Shotty bilateral axillary lymph nodes are individually not enlarged by size criteria and presumably reactive due to patient body habitus. No mediastinal, hilar axillary lymphadenopathy.  Limited early arterial phase  evaluation of the upper abdomen is normal. No acute or aggressive osseous abnormalities.  Regional soft tissues appear normal.  IMPRESSION: 1. No evidence of pulmonary embolism. 2. Cardiomegaly with trace bilateral effusions - constellation of findings suggestive of early pulmonary edema. Clinical correlation is advised. 3. Borderline enlarged caliber of the main pulmonary artery, nonspecific though could be seen in the setting of pulmonary arterial hypertension. Further evaluation with cardiac echo could be performed as clinically indicated.   Electronically Signed   By: Simonne Come M.D.   On: 05/17/2014 18:02    Microbiology: No results found for this or any previous visit (from the past 240 hour(s)).   Labs: Basic Metabolic Panel:  Recent Labs Lab 05/17/14 1609 05/18/14 0200 05/19/14 0608  NA 138 139 141  K 3.1* 3.2* 3.3*  CL 107 107 104  CO2 GLUCOSE 107* 146* 119*  BUN CREATININE 0.67 0.70 0.68  CALCIUM 8.5 8.3* 8.9   Liver Function Tests:  Recent Labs Lab 05/18/14 0200  AST 18  ALT 21  ALKPHOS 106  BILITOT 0.5  PROT 6.3  ALBUMIN 3.1*   No results for input(s): LIPASE, AMYLASE in the last 168 hours. No results for input(s): AMMONIA in the last 168 hours. CBC:  Recent Labs Lab 05/17/14 1609 05/19/14 0608  WBC 11.5* 9.5  NEUTROABS 6.8  --   HGB 9.9* 10.7*  HCT 31.2* 34.1*  MCV 78.0 77.9*  PLT 334 337   Cardiac Enzymes:  Recent Labs Lab 05/17/14 1609 05/17/14 1954 05/18/14 0200 05/18/14 0700  TROPONINI <0.03 <0.03 <0.03 <0.03   BNP: BNP (last 3 results)  Recent Labs  05/17/14 1609 05/17/14 1954  BNP 145.0* 142.0*    ProBNP (last 3 results) No results for input(s): PROBNP in the last 8760 hours.  CBG:  Recent Labs Lab 05/18/14 0743 05/18/14 1142 05/18/14 1650 05/18/14 2115 05/19/14 0752  GLUCAP 134* 105* 124* 169* 136*       Signed:  Amantha Sklar  [facility] 05/19/2014, 10:15 AM

## 2014-05-21 NOTE — Care Management Utilization Note (Signed)
UR completed 

## 2014-05-29 ENCOUNTER — Encounter: Payer: Self-pay | Admitting: Family Medicine

## 2014-05-29 ENCOUNTER — Ambulatory Visit (INDEPENDENT_AMBULATORY_CARE_PROVIDER_SITE_OTHER): Payer: BLUE CROSS/BLUE SHIELD | Admitting: Family Medicine

## 2014-05-29 VITALS — BP 130/80 | HR 76 | Temp 99.3°F | Resp 14 | Ht 70.0 in | Wt 256.0 lb

## 2014-05-29 DIAGNOSIS — B3324 Viral cardiomyopathy: Secondary | ICD-10-CM

## 2014-05-29 DIAGNOSIS — B370 Candidal stomatitis: Secondary | ICD-10-CM

## 2014-05-29 DIAGNOSIS — E669 Obesity, unspecified: Secondary | ICD-10-CM | POA: Diagnosis not present

## 2014-05-29 DIAGNOSIS — E119 Type 2 diabetes mellitus without complications: Secondary | ICD-10-CM

## 2014-05-29 DIAGNOSIS — I43 Cardiomyopathy in diseases classified elsewhere: Secondary | ICD-10-CM

## 2014-05-29 MED ORDER — NYSTATIN 100000 UNIT/ML MT SUSP: 5.0000 mL | Freq: Four times a day (QID) | OROMUCOSAL | Status: DC

## 2014-05-29 NOTE — Assessment & Plan Note (Signed)
Long discussion over weight and diet

## 2014-05-29 NOTE — Assessment & Plan Note (Signed)
Add Invokana 100mg  to MTF, discussed SE of mycotic infections and UTI

## 2014-05-29 NOTE — Assessment & Plan Note (Signed)
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

## 2014-05-29 NOTE — Patient Instructions (Addendum)
We will call with lab results Monday- get Chest xray  Use the nystatin rinse as prescribed Start Invokana take 1 tablet daily FU 3 months

## 2014-05-29 NOTE — Progress Notes (Signed)
Patient ID: Samantha Lucas, female   DOB: 07/31/1990, 24 y.o.   MRN: [id number]        Subjective:    Patient ID: Samantha Lucas, female    DOB: 11/25/1990, 24 y.o.   MRN: [id number]  Patient presents for 3 month F/U and Hosptal F/U  patient to follow up hospital admission for viral cardiomyopathy. She is doing well since then. She is now using Lasix as needed which she has not required in the past few days. She denies any shortness of breath. She did try to work out the past couple days and had some soreness in her upper chest but no chest pain. Her blood sugars have been elevated her last A1c was at 7. 4%. Her fasting blood sugars have ranged from 150 to 170s she has been eating out more not watching her diet. She is also only been on metformin 500 mg twice a day she cannot afford Onglyza.  she has noted a white film on her tongue which has been a little sore for the past few days.  ER notes and labs reviewed.   Review Of Systems:  GEN- denies fatigue, fever, weight loss,weakness, recent illness HEENT- denies eye drainage, change in vision, nasal discharge, CVS- denies chest pain, palpitations RESP- denies SOB, cough, wheeze ABD- denies N/V, change in stools, abd pain GU- denies dysuria, hematuria, dribbling, incontinence MSK- denies joint pain, muscle aches, injury Neuro- denies headache, dizziness, syncope, seizure activity       Objective:    BP 130/80 mmHg  Pulse 76  Temp(Src) 99.3 F (37.4 C) (Oral)  Resp 14  Ht 5\' 10"  (1.778 m)  Wt 256 lb (116.121 kg)  BMI 36.73 kg/m2  LMP 05/11/2014 GEN- NAD, alert and oriented x3 HEENT- PERRL, EOMI, non injected sclera, pink conjunctiva, MMM, post oropharynx clear, thrush on tongue Neck- Supple, no thyromegaly CVS- RRR, no murmur Chest wall- Mild TTP upper left chest and near axilla, no nodules palpated RESP-CTAB ABD-NABS,soft,NT,ND EXT- No edema Pulses- Radial, DP- 2+        Assessment & Plan:      Problem List Items  Addressed This Visit    Viral cardiomyopathy    Now resolved, plan for repeat CXR on Monday, 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 summer break from college      Relevant Orders   DG Chest 2 View   Obesity    Long discussion over weight and diet      Relevant Medications   canagliflozin (INVOKANA) 100 MG TABS tablet   Diabetes mellitus, type II - Primary    Add Invokana 100mg  to MTF, discussed SE of mycotic infections and UTI       Relevant Medications   canagliflozin (INVOKANA) 100 MG TABS tablet   Other Relevant Orders   Microalbumin/Creatinine Ratio, Urine   CBC with Differential/Platelet   Comprehensive metabolic panel   Lipid panel   Hemoglobin A1c    Other Visit Diagnoses    Thrush        Nystatin given    Relevant Medications    nystatin (MYCOSTATIN) 100000 UNIT/ML suspension       Note: This dictation was prepared with Dragon dictation along with smaller phrase technology. Any transcriptional errors that result from this process are unintentional.

## 2014-05-30 LAB — LIPID PANEL
Cholesterol: 150 mg/dL (ref 0–200)
HDL: 34 mg/dL — ABNORMAL LOW (ref >=46)
LDL Cholesterol: 101 mg/dL — ABNORMAL HIGH (ref 0–99)
TRIGLYCERIDES: 77 mg/dL (ref <150)
Total CHOL/HDL Ratio: 4.4 Ratio
VLDL: 15 mg/dL (ref 0–40)

## 2014-05-30 LAB — COMPREHENSIVE METABOLIC PANEL
ALBUMIN: 3.8 g/dL (ref 3.5–5.2)
ALT: 9 U/L (ref 0–35)
AST: 12 U/L (ref 0–37)
Alkaline Phosphatase: 106 U/L (ref 39–117)
BUN: 6 mg/dL (ref 6–23)
CO2: 22 mEq/L (ref 19–32)
CREATININE: 0.55 mg/dL (ref 0.50–1.10)
Calcium: 8.9 mg/dL (ref 8.4–10.5)
Chloride: 102 mEq/L (ref 96–112)
Glucose, Bld: 144 mg/dL — ABNORMAL HIGH (ref 70–99)
POTASSIUM: 4.3 meq/L (ref 3.5–5.3)
SODIUM: 135 meq/L (ref 135–145)
Total Bilirubin: 0.5 mg/dL (ref 0.2–1.2)
Total Protein: 7.6 g/dL (ref 6.0–8.3)

## 2014-05-30 LAB — CBC WITH DIFFERENTIAL/PLATELET
Basophils Absolute: 0 10*3/uL (ref 0.0–0.1)
Basophils Relative: 0 % (ref 0–1)
EOS PCT: 1 % (ref 0–5)
Eosinophils Absolute: 0.1 10*3/uL (ref 0.0–0.7)
HCT: 39.1 % (ref 36.0–46.0)
Hemoglobin: 12.2 g/dL (ref 12.0–15.0)
Lymphocytes Relative: 33 % (ref 12–46)
Lymphs Abs: 3 10*3/uL (ref 0.7–4.0)
MCH: 24.1 pg — AB (ref 26.0–34.0)
MCHC: 31.2 g/dL (ref 30.0–36.0)
MCV: 77.3 fL — ABNORMAL LOW (ref 78.0–100.0)
MPV: 9.5 fL (ref 8.6–12.4)
Monocytes Absolute: 0.6 10*3/uL (ref 0.1–1.0)
Monocytes Relative: 6 % (ref 3–12)
NEUTROS PCT: 60 % (ref 43–77)
Neutro Abs: 5.5 10*3/uL (ref 1.7–7.7)
PLATELETS: 382 10*3/uL (ref 150–400)
RBC: 5.06 MIL/uL (ref 3.87–5.11)
RDW: 16.9 % — AB (ref 11.5–15.5)
WBC: 9.2 10*3/uL (ref 4.0–10.5)

## 2014-05-30 LAB — MICROALBUMIN / CREATININE URINE RATIO
Creatinine, Urine: 131.2 mg/dL
MICROALB UR: 0.2 mg/dL (ref <2.0)
MICROALB/CREAT RATIO: 1.5 mg/g (ref 0.0–30.0)

## 2014-05-30 LAB — HEMOGLOBIN A1C
Hgb A1c MFr Bld: 8.9 % — ABNORMAL HIGH (ref <5.7)
Mean Plasma Glucose: 209 mg/dL — ABNORMAL HIGH (ref <117)

## 2014-06-04 ENCOUNTER — Ambulatory Visit (HOSPITAL_COMMUNITY)
Admission: RE | Admit: 2014-06-04 | Discharge: 2014-06-04 | Disposition: A | Discharge status: Home / Self Care | Payer: BLUE CROSS/BLUE SHIELD | Source: Ambulatory Visit | Attending: Family Medicine | Admitting: Family Medicine

## 2014-06-04 DIAGNOSIS — B3324 Viral cardiomyopathy: Secondary | ICD-10-CM | POA: Diagnosis present

## 2014-06-26 ENCOUNTER — Telehealth: Payer: Self-pay | Admitting: Family Medicine

## 2014-06-26 MED ORDER — CANAGLIFLOZIN 100 MG PO TABS: 100.0000 mg | ORAL_TABLET | Freq: Every day | ORAL | Status: DC

## 2014-06-26 NOTE — Telephone Encounter (Signed)
Call placed to patient.   Reports that she requires BS medication Invokana.   Prescription sent to pharmacy.

## 2014-06-26 NOTE — Telephone Encounter (Signed)
Patient got samples of blood pressure medication, would like to know if a rx for that can be called into her pharmacy  [facility]  [phone]

## 2014-07-12 ENCOUNTER — Other Ambulatory Visit: Payer: Self-pay | Admitting: Family Medicine

## 2014-07-12 NOTE — Telephone Encounter (Signed)
Refill appropriate and filled per protocol. 

## 2014-08-08 ENCOUNTER — Other Ambulatory Visit: Payer: Self-pay | Admitting: Family Medicine

## 2014-08-09 NOTE — Telephone Encounter (Signed)
Medication refilled per protocol. 

## 2014-08-15 ENCOUNTER — Telehealth: Payer: Self-pay | Admitting: Family Medicine

## 2014-08-15 MED ORDER — FLUCONAZOLE 150 MG PO TABS: 150.0000 mg | ORAL_TABLET | Freq: Once | ORAL | Status: DC

## 2014-08-15 MED ORDER — TRIAMCINOLONE ACETONIDE 0.1 % EX CREA: 1.0000 "application " | TOPICAL_CREAM | Freq: Two times a day (BID) | CUTANEOUS | Status: DC

## 2014-08-15 NOTE — Telephone Encounter (Signed)
Call placed to patient to inquire.   States that she has increased itching and some irritation to outer vaginal labia. States that this is common since beginning Invokana.  Requested refill on Triamcinolone labia irritation.   Prescription sent to pharmacy. Also sent prescription for Diflucan. Advised that if S/Sx do not improve or if they worsen by Thursday to contact office for OV.

## 2014-08-15 NOTE — Telephone Encounter (Signed)
[phone] PT would like to have something called in for a yeast infection

## 2014-08-23 ENCOUNTER — Other Ambulatory Visit: Payer: Self-pay | Admitting: Family Medicine

## 2014-08-23 NOTE — Telephone Encounter (Signed)
Medication refilled per protocol. 

## 2014-09-04 ENCOUNTER — Ambulatory Visit (INDEPENDENT_AMBULATORY_CARE_PROVIDER_SITE_OTHER): Payer: BLUE CROSS/BLUE SHIELD | Admitting: Family Medicine

## 2014-09-04 ENCOUNTER — Encounter: Payer: Self-pay | Admitting: Family Medicine

## 2014-09-04 VITALS — BP 122/68 | HR 78 | Temp 98.9°F | Resp 16 | Ht 70.0 in | Wt 252.0 lb

## 2014-09-04 DIAGNOSIS — E669 Obesity, unspecified: Secondary | ICD-10-CM

## 2014-09-04 DIAGNOSIS — E119 Type 2 diabetes mellitus without complications: Secondary | ICD-10-CM | POA: Diagnosis not present

## 2014-09-04 DIAGNOSIS — B372 Candidiasis of skin and nail: Secondary | ICD-10-CM | POA: Diagnosis not present

## 2014-09-04 DIAGNOSIS — B373 Candidiasis of vulva and vagina: Secondary | ICD-10-CM

## 2014-09-04 DIAGNOSIS — B3731 Acute candidiasis of vulva and vagina: Secondary | ICD-10-CM

## 2014-09-04 DIAGNOSIS — Z23 Encounter for immunization: Secondary | ICD-10-CM

## 2014-09-04 LAB — URINALYSIS, ROUTINE W REFLEX MICROSCOPIC
Bilirubin Urine: NEGATIVE
Hgb urine dipstick: NEGATIVE
Ketones, ur: NEGATIVE
NITRITE: NEGATIVE
Protein, ur: NEGATIVE
Specific Gravity, Urine: 1.01 (ref 1.001–1.035)
pH: 5.5 (ref 5.0–8.0)

## 2014-09-04 LAB — CBC WITH DIFFERENTIAL/PLATELET
BASOS ABS: 0 10*3/uL (ref 0.0–0.1)
Basophils Relative: 0 % (ref 0–1)
Eosinophils Absolute: 0.1 10*3/uL (ref 0.0–0.7)
Eosinophils Relative: 1 % (ref 0–5)
HCT: 41.3 % (ref 36.0–46.0)
HEMOGLOBIN: 13.1 g/dL (ref 12.0–15.0)
LYMPHS PCT: 29 % (ref 12–46)
Lymphs Abs: 3 10*3/uL (ref 0.7–4.0)
MCH: 24.2 pg — ABNORMAL LOW (ref 26.0–34.0)
MCHC: 31.7 g/dL (ref 30.0–36.0)
MCV: 76.2 fL — ABNORMAL LOW (ref 78.0–100.0)
MONO ABS: 0.7 10*3/uL (ref 0.1–1.0)
MPV: 9.3 fL (ref 8.6–12.4)
Monocytes Relative: 7 % (ref 3–12)
NEUTROS PCT: 63 % (ref 43–77)
Neutro Abs: 6.6 10*3/uL (ref 1.7–7.7)
PLATELETS: 376 10*3/uL (ref 150–400)
RBC: 5.42 MIL/uL — ABNORMAL HIGH (ref 3.87–5.11)
RDW: 15.6 % — ABNORMAL HIGH (ref 11.5–15.5)
WBC: 10.5 10*3/uL (ref 4.0–10.5)

## 2014-09-04 LAB — URINALYSIS, MICROSCOPIC ONLY
Casts: NONE SEEN [LPF]
Crystals: NONE SEEN [HPF]

## 2014-09-04 LAB — WET PREP FOR TRICH, YEAST, CLUE
Clue Cells Wet Prep HPF POC: NONE SEEN
Trich, Wet Prep: NONE SEEN
YEAST WET PREP: NONE SEEN

## 2014-09-04 LAB — COMPREHENSIVE METABOLIC PANEL
ALBUMIN: 3.9 g/dL (ref 3.6–5.1)
ALT: 8 U/L (ref 6–29)
AST: 13 U/L (ref 10–30)
Alkaline Phosphatase: 100 U/L (ref 33–115)
BILIRUBIN TOTAL: 0.4 mg/dL (ref 0.2–1.2)
BUN: 10 mg/dL (ref 7–25)
CALCIUM: 9.3 mg/dL (ref 8.6–10.2)
CO2: 22 mmol/L (ref 20–31)
Chloride: 102 mmol/L (ref 98–110)
Creat: 0.65 mg/dL (ref 0.50–1.10)
Glucose, Bld: 136 mg/dL — ABNORMAL HIGH (ref 70–99)
Potassium: 3.9 mmol/L (ref 3.5–5.3)
Sodium: 136 mmol/L (ref 135–146)
Total Protein: 7.4 g/dL (ref 6.1–8.1)

## 2014-09-04 LAB — HEMOGLOBIN A1C
HEMOGLOBIN A1C: 7.3 % — AB (ref <5.7)
MEAN PLASMA GLUCOSE: 163 mg/dL — AB (ref <117)

## 2014-09-04 MED ORDER — FLUCONAZOLE 150 MG PO TABS: ORAL_TABLET | ORAL | Status: DC

## 2014-09-04 MED ORDER — CANAGLIFLOZIN 100 MG PO TABS: ORAL_TABLET | ORAL | Status: DC

## 2014-09-04 MED ORDER — METFORMIN HCL 500 MG PO TABS: ORAL_TABLET | ORAL | Status: DC

## 2014-09-04 MED ORDER — NYSTATIN 100000 UNIT/GM EX CREA
1.0000 | TOPICAL_CREAM | Freq: Two times a day (BID) | CUTANEOUS | Status: DC
Start: 2014-09-04 — End: 2014-12-07

## 2014-09-04 NOTE — Assessment & Plan Note (Signed)
Recheck A1C, continue ACEI, goal < 7%

## 2014-09-04 NOTE — Patient Instructions (Signed)
F/U 4 months Use cream twice a day  Diflucan Continue to work on diet and weight loss

## 2014-09-04 NOTE — Progress Notes (Signed)
Patient ID: Samantha Lucas, female   DOB: March 02, 1990, 24 y.o.   MRN: [id number]   Subjective:    Patient ID: Samantha Lucas, female    DOB: 12/23/1990, 24 y.o.   MRN: [id number]  Patient presents for 3 month F/U and Vaginal Itching   Patient here to follow-up chronic medical problems. Diabetes mellitus her last A1c was 8.9% she is currently on Invokana  And metformin.  Her fasting blood sugars range from 99 and 170. She has not had any hypoglycemia. She has had a yeast infection for the past couple weeks. She also states that because of the itching on the outside she has been rubbing herself raw. She denies any dysuria. She is not sexually active. Her weight is down 4 pounds from her last visit she is trying to monitor her diet but states that she drinks too much soda and sweet Tea   immunizations reviewed she was due for her meningitis vaccines  Review Of Systems:  GEN- denies fatigue, fever, weight loss,weakness, recent illness HEENT- denies eye drainage, change in vision, nasal discharge, CVS- denies chest pain, palpitations RESP- denies SOB, cough, wheeze ABD- denies N/V, change in stools, abd pain GU- denies dysuria, hematuria, dribbling, incontinence MSK- denies joint pain, muscle aches, injury Neuro- denies headache, dizziness, syncope, seizure activity       Objective:    BP 122/68 mmHg  Pulse 78  Temp(Src) 98.9 F (37.2 C) (Oral)  Resp 16  Ht  (1.778 m)  Wt 252 lb (114.306 kg)  BMI 36.16 kg/m2  LMP 08/17/2014 (Approximate) GEN- NAD, alert and oriented x3 HEENT- PERRL, EOMI, non injected sclera, pink conjunctiva, MMM, oropharynx clear CVS- RRR, no murmur RESP-CTAB ABD-NABS,soft,NT,ND GU- normal external genitalia, - erythema with maceration along labia majora and inguinal creases into gluteal cleft,  vaginal mucosa pink and moist, +discharge,- pt declined use of speculum- blind q tip  Ext- no edema      Assessment & Plan:      Problem List Items Addressed This  Visit    Obesity   Diabetes mellitus, type II    Recheck A1C, continue ACEI, goal < 7%      Relevant Orders   CBC with Differential/Platelet   Comprehensive metabolic panel   Hemoglobin A1c    Other Visit Diagnoses    Intertriginous candidiasis    -  Primary    Topical nystatin    Relevant Medications    fluconazole (DIFLUCAN) 150 MG tablet    nystatin cream (MYCOSTATIN)    Other Relevant Orders    Urinalysis, Routine w reflex microscopic (not at [facility]) (Completed)    WET PREP FOR TRICH, YEAST, CLUE (Completed)    Need for prophylactic vaccination and inoculation against unspecified single disease        Relevant Orders    Meningococcal B, OMV (Bexsero) (Completed)    Meningococcal conjugate vaccine 4-valent IM (Completed)    Vaginal yeast infection        Discussed Invokana along with her diet, is cause, she does not want to change meds at this time, given diflucan    Relevant Medications    fluconazole (DIFLUCAN) 150 MG tablet    nystatin cream (MYCOSTATIN)       Note: This dictation was prepared with Dragon dictation along with smaller phrase technology. Any transcriptional errors that result from this process are unintentional.

## 2014-09-17 ENCOUNTER — Telehealth: Payer: Self-pay | Admitting: Family Medicine

## 2014-09-17 NOTE — Telephone Encounter (Signed)
Noted, local reaction typical with injections, agree with above

## 2014-09-17 NOTE — Telephone Encounter (Signed)
Returned call to patient.   Reports that R arm where Meningitis conjugate was administered on 8/9 continues to have hard knot under skin. States that area is non-tender. Advised to continue with heat to arm and if knot remains x1 week more to contact office.

## 2014-09-17 NOTE — Telephone Encounter (Signed)
Patient says that she still has a knot in her arm after receiving her meningitis shot and would like a call back regarding this  [phone]

## 2014-09-27 ENCOUNTER — Other Ambulatory Visit: Payer: Self-pay | Admitting: Family Medicine

## 2014-09-27 NOTE — Telephone Encounter (Signed)
Refill appropriate and filled per protocol. 

## 2014-10-12 ENCOUNTER — Telehealth: Payer: Self-pay | Admitting: *Deleted

## 2014-10-12 MED ORDER — FLUCONAZOLE 150 MG PO TABS: ORAL_TABLET | ORAL | Status: DC

## 2014-10-12 NOTE — Telephone Encounter (Signed)
Received call from patient.   Reports that she is having return S/Sx of yeast infection.   Prescription sent to pharmacy for Diflucan. Advised that if S/Sx do not resolve after dosage, OV will be required.

## 2014-10-26 ENCOUNTER — Encounter: Payer: Self-pay | Admitting: Family Medicine

## 2014-10-26 ENCOUNTER — Ambulatory Visit (INDEPENDENT_AMBULATORY_CARE_PROVIDER_SITE_OTHER): Payer: BLUE CROSS/BLUE SHIELD | Admitting: Family Medicine

## 2014-10-26 VITALS — BP 122/72 | HR 78 | Temp 98.6°F | Resp 16 | Ht 70.0 in | Wt 250.0 lb

## 2014-10-26 DIAGNOSIS — R197 Diarrhea, unspecified: Secondary | ICD-10-CM | POA: Diagnosis not present

## 2014-10-26 DIAGNOSIS — A059 Bacterial foodborne intoxication, unspecified: Secondary | ICD-10-CM

## 2014-10-26 MED ORDER — DIPHENOXYLATE-ATROPINE 2.5-0.025 MG PO TABS: 1.0000 | ORAL_TABLET | Freq: Four times a day (QID) | ORAL | Status: DC | PRN

## 2014-10-26 MED ORDER — METRONIDAZOLE 500 MG PO TABS: 500.0000 mg | ORAL_TABLET | Freq: Two times a day (BID) | ORAL | Status: DC

## 2014-10-26 MED ORDER — FLUCONAZOLE 150 MG PO TABS: ORAL_TABLET | ORAL | Status: DC

## 2014-10-26 NOTE — Patient Instructions (Addendum)
Take the Flagyl if not improved or you get Fever Take the Lomotil  Diflucan for yeast infection  Hold the metformin and Lisinipril F/U as previous

## 2014-10-26 NOTE — Progress Notes (Signed)
Patient ID: Samantha Lucas, female   DOB: [birth year], 24 y.o.   MRN: [id number]   Subjective:    Patient ID: Samantha Lucas, female    DOB: [birth year], 24 y.o.   MRN: [id number]  Patient presents for Illness  patient here with nausea vomiting diarrhea. On Sunday she ate at [employer] approximately one hour later she vomited up all of her food a few hours later that evening she had another episode of emesis. She then again to have a lot of gas and diarrhea within 24 hours. The nausea is now resolved and the vomiting but she still has the diarrhea a few times a day. She states that she feels well does not feel sick but cannot stop the diarrhea and she is going out of town. She did try Imodium and this has helped some. She denies any blood in the stool, she is still taking her metformin all her other medicines as described. She did take 1 tablet of Cipro from her grandmother   Her blood sugars in general have been good she states her blood sugar this morning was 103. She does admit to still eating too much fast food.    Review Of Systems:  GEN- denies fatigue, fever, weight loss,weakness, recent illness HEENT- denies eye drainage, change in vision, nasal discharge, CVS- denies chest pain, palpitations RESP- denies SOB, cough, wheeze ABD- denies N/V, change in stools, abd pain GU- denies dysuria, hematuria, dribbling, incontinence MSK- denies joint pain, muscle aches, injury Neuro- denies headache, dizziness, syncope, seizure activity       Objective:    BP 122/72 mmHg  Pulse 78  Temp(Src) 98.6 F (37 C) (Oral)  Resp 16  Ht  (1.778 m)  Wt 250 lb (113.399 kg)  BMI 35.87 kg/m2 GEN- NAD, alert and oriented x3 HEENT- PERRL, EOMI, non injected sclera, pink conjunctiva, MMM, oropharynx clear Neck- Supple, no LAD CVS- RRR, no murmur RESP-CTAB ABD-NABS,soft,NT,ND EXT- No edema Pulses- Radial, DP- 2+        Assessment & Plan:      Problem List Items Addressed This Visit    None     Visit Diagnoses    Food poisoning    -  Primary    Based on history I think she may have had food poisoning, but with her medications diarrhea persist. Will give Lomotil, hold MTF and LISInopril though Monday. Non toxic and well appearing, I did give script for Flagyl in case of worsening symptoms and dIFLUCAN due to recurrent yeast infections on antibiotics    Diarrhea           Note: This dictation was prepared with Dragon dictation along with smaller phrase technology. Any transcriptional errors that result from this process are unintentional.

## 2014-11-07 ENCOUNTER — Ambulatory Visit: Payer: BLUE CROSS/BLUE SHIELD | Admitting: Family Medicine

## 2014-11-16 ENCOUNTER — Other Ambulatory Visit: Payer: Self-pay | Admitting: Family Medicine

## 2014-11-16 NOTE — Telephone Encounter (Signed)
Okay to refill? 

## 2014-11-16 NOTE — Telephone Encounter (Signed)
Medication refilled per protocol. 

## 2014-11-16 NOTE — Telephone Encounter (Signed)
OK refill?? 

## 2014-12-05 ENCOUNTER — Ambulatory Visit: Payer: BLUE CROSS/BLUE SHIELD | Admitting: Family Medicine

## 2014-12-07 ENCOUNTER — Ambulatory Visit (INDEPENDENT_AMBULATORY_CARE_PROVIDER_SITE_OTHER): Payer: BLUE CROSS/BLUE SHIELD | Admitting: Family Medicine

## 2014-12-07 ENCOUNTER — Encounter: Payer: Self-pay | Admitting: Family Medicine

## 2014-12-07 VITALS — BP 124/78 | HR 82 | Temp 98.0°F | Resp 16 | Ht 70.0 in | Wt 251.0 lb

## 2014-12-07 DIAGNOSIS — E119 Type 2 diabetes mellitus without complications: Secondary | ICD-10-CM | POA: Diagnosis not present

## 2014-12-07 DIAGNOSIS — E669 Obesity, unspecified: Secondary | ICD-10-CM | POA: Diagnosis not present

## 2014-12-07 DIAGNOSIS — Z418 Encounter for other procedures for purposes other than remedying health state: Secondary | ICD-10-CM

## 2014-12-07 DIAGNOSIS — Z299 Encounter for prophylactic measures, unspecified: Secondary | ICD-10-CM

## 2014-12-07 LAB — LIPID PANEL
CHOL/HDL RATIO: 3.6 ratio (ref <=5.0)
Cholesterol: 151 mg/dL (ref 125–200)
HDL: 42 mg/dL — ABNORMAL LOW (ref >=46)
LDL CALC: 93 mg/dL (ref <130)
Triglycerides: 82 mg/dL (ref <150)
VLDL: 16 mg/dL (ref <30)

## 2014-12-07 LAB — COMPREHENSIVE METABOLIC PANEL
ALBUMIN: 4 g/dL (ref 3.6–5.1)
ALT: 9 U/L (ref 6–29)
AST: 14 U/L (ref 10–30)
Alkaline Phosphatase: 109 U/L (ref 33–115)
BILIRUBIN TOTAL: 0.5 mg/dL (ref 0.2–1.2)
BUN: 10 mg/dL (ref 7–25)
CO2: 23 mmol/L (ref 20–31)
CREATININE: 0.64 mg/dL (ref 0.50–1.10)
Calcium: 9.2 mg/dL (ref 8.6–10.2)
Chloride: 106 mmol/L (ref 98–110)
Glucose, Bld: 95 mg/dL (ref 70–99)
Potassium: 4.4 mmol/L (ref 3.5–5.3)
SODIUM: 139 mmol/L (ref 135–146)
TOTAL PROTEIN: 7.4 g/dL (ref 6.1–8.1)

## 2014-12-07 LAB — HEMOGLOBIN A1C
Hgb A1c MFr Bld: 6.8 % — ABNORMAL HIGH (ref <5.7)
MEAN PLASMA GLUCOSE: 148 mg/dL — AB (ref <117)

## 2014-12-07 MED ORDER — NYSTATIN 100000 UNIT/GM EX CREA: 1.0000 "application " | TOPICAL_CREAM | Freq: Two times a day (BID) | CUTANEOUS | Status: DC

## 2014-12-07 NOTE — Patient Instructions (Signed)
Continue current medications WOrk on diet and exercise F/U 4 months

## 2014-12-07 NOTE — Assessment & Plan Note (Addendum)
Diabetes has been fairly well-controlled. We can get her to stick with a good meal plan and exercise she will be able to control this with her diet. Reiterated dietary planning with her.  goals A1c less than 7%. Recheck labs today. Flu shots and meningitis vaccine given

## 2014-12-07 NOTE — Progress Notes (Signed)
Patient ID: Samantha Lucas, female   DOB: 11/12/1990, 24 y.o.   MRN: [id number]   Subjective:    Patient ID: Samantha Lucas, female    DOB: 07/16/1990, 24 y.o.   MRN: [id number]  Patient presents for 3 month F/U  patient here to follow diabetes mellitus. Her last A1c was 7.3%. Her blood sugars range from 88-126 fasting. She did have 2 outliers of 150s. No hypoglycemia symptoms. She's taken the metformin as well as Invokana.  He is due for repeat labs today. Her immunizations were also reviewed she is due for meningitis booster and flu shot.   she did request refill on her nystatin cream  she continues to have difficulty with meal planning and eating a lot of carbs. She has a [employer] membership and she has not been going she has been helping her mother with her grandmother who has dementia and she is also in school.  Review Of Systems:  GEN- denies fatigue, fever, weight loss,weakness, recent illness HEENT- denies eye drainage, change in vision, nasal discharge, CVS- denies chest pain, palpitations RESP- denies SOB, cough, wheeze ABD- denies N/V, change in stools, abd pain GU- denies dysuria, hematuria, dribbling, incontinence MSK- denies joint pain, muscle aches, injury Neuro- denies headache, dizziness, syncope, seizure activity       Objective:    BP 124/78 mmHg  Pulse 82  Temp(Src) 98 F (36.7 C) (Oral)  Resp 16  Ht 5\' 10"  (1.778 m)  Wt 251 lb (113.853 kg)  BMI 36.01 kg/m2  LMP 11/12/2014 GEN- NAD, alert and oriented x3 HEENT- PERRL, EOMI, non injected sclera, pink conjunctiva, MMM, oropharynx clear CVS- RRR, no murmur RESP-CTAB EXT- No edema Pulses- Radial, DP- 2+        Assessment & Plan:      Problem List Items Addressed This Visit    Obesity     Unable to get a nutritionist  at this time between school and helping her mother with caregiving      Relevant Orders   Flu Vaccine QUAD 36+ mos PF IM (Fluarix & Fluzone Quad PF) (Completed)   Meningococcal B, OMV  (Bexsero) (Completed)   Diabetes mellitus, type II (HCC) - Primary     Diabetes has been fairly well-controlled. We can get her to stick with a good meal plan and exercise she will be able to control this with her diet. Reiterated dietary planning with her.  goals A1c less than 7%. Recheck labs today. Flu shots and meningitis vaccine given      Relevant Orders   Flu Vaccine QUAD 36+ mos PF IM (Fluarix & Fluzone Quad PF) (Completed)   Meningococcal B, OMV (Bexsero) (Completed)   Comprehensive metabolic panel   Hemoglobin A1c   Lipid panel   HM DIABETES FOOT EXAM (Completed)    Other Visit Diagnoses    Need for prophylactic measure        Relevant Orders    Flu Vaccine QUAD 36+ mos PF IM (Fluarix & Fluzone Quad PF) (Completed)    Meningococcal B, OMV (Bexsero) (Completed)       Note: This dictation was prepared with Dragon dictation along with smaller phrase technology. Any transcriptional errors that result from this process are unintentional.

## 2014-12-07 NOTE — Assessment & Plan Note (Signed)
Unable to get a nutritionist  at this time between school and helping her mother with caregiving

## 2014-12-11 ENCOUNTER — Other Ambulatory Visit: Payer: Self-pay | Admitting: Family Medicine

## 2014-12-24 ENCOUNTER — Encounter: Payer: Self-pay | Admitting: Family Medicine

## 2014-12-24 ENCOUNTER — Ambulatory Visit (INDEPENDENT_AMBULATORY_CARE_PROVIDER_SITE_OTHER): Payer: BLUE CROSS/BLUE SHIELD | Admitting: Physician Assistant

## 2014-12-24 ENCOUNTER — Encounter: Payer: Self-pay | Admitting: Physician Assistant

## 2014-12-24 VITALS — BP 122/72 | HR 60 | Temp 98.6°F | Resp 18 | Wt 253.0 lb

## 2014-12-24 DIAGNOSIS — J988 Other specified respiratory disorders: Secondary | ICD-10-CM | POA: Diagnosis not present

## 2014-12-24 DIAGNOSIS — B9689 Other specified bacterial agents as the cause of diseases classified elsewhere: Principal | ICD-10-CM

## 2014-12-24 MED ORDER — FLUTICASONE PROPIONATE 50 MCG/ACT NA SUSP: 2.0000 | Freq: Every day | NASAL | Status: DC

## 2014-12-24 MED ORDER — AZITHROMYCIN 250 MG PO TABS: ORAL_TABLET | ORAL | Status: DC

## 2014-12-24 NOTE — Progress Notes (Signed)
Patient ID: SIREN PORRATA MRN: [id number], DOB: 1990/07/26, 24 y.o. Date of Encounter: 12/24/2014, 2:38 PM    Chief Complaint:  Chief Complaint  Patient presents with  . sick x 5 days    sinus pressure and drainage, cough     HPI: 24 y.o. year old AA female presents with above. Says that she is getting little out of her nose. However she feels draining down her throat. Also is having a lot of cough. Has been having sinus headache and sinus pressure. Has had no known fevers or chills. No significant sore throat except for just getting sore secondary to cough. 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     Home Meds:   Outpatient Prescriptions Prior to Visit  Medication Sig Dispense Refill  . canagliflozin (INVOKANA) 100 MG TABS tablet TAKE 1 TABLET(100 MG) BY MOUTH DAILY 30 tablet 5  . ferrous sulfate 325 (65 FE) MG tablet Take 1 tablet (325 mg total) by mouth 3 (three) times daily with meals.  3  . ibuprofen (ADVIL,MOTRIN) 200 MG tablet Take 200 mg by mouth every 6 (six) hours as needed for mild pain or moderate pain.    Marland Kitchen lisinopril (PRINIVIL,ZESTRIL) 2.5 MG tablet TAKE 1 TABLET BY MOUTH EVERY DAY TO PROTECT KIDNEYS 30 tablet 5  . metFORMIN (GLUCOPHAGE) 500 MG tablet TAKE 1 TABLET BY MOUTH TWICE DAILY WITH A MEAL 60 tablet 11  . Multiple Vitamin (MULITIVITAMIN WITH MINERALS) TABS Take 1 tablet by mouth daily.    Marland Kitchen nystatin cream (MYCOSTATIN) Apply 1 application topically 2 (two) times daily. 30 g 3  . triamcinolone cream (KENALOG) 0.1 % Apply 1 application topically 2 (two) times daily. 30 g 0   No facility-administered medications prior to visit.    Allergies: No Known Allergies    Review of Systems: See HPI for pertinent ROS. All other ROS negative.    Physical Exam: Blood pressure 122/72, pulse 60, temperature 98.6 F (37 C), temperature source Oral, resp. rate 18, weight 253 lb (114.76 kg), last menstrual period 11/12/2014., Body mass  index is 36.3 kg/(m^2). General: Obese African-American female. Appears in no acute distress. HEENT: Normocephalic, atraumatic, eyes without discharge, sclera non-icteric, nares are without discharge. Bilateral auditory canals clear, TM's are without perforation, pearly grey and translucent with reflective cone of light bilaterally. Oral cavity moist, posterior pharynx without exudate, erythema, peritonsillar abscess. No tenderness with percussion of frontal or maxillary sinuses bilaterally.  Neck: Supple. No thyromegaly. No lymphadenopathy. Lungs: Clear bilaterally to auscultation without wheezes, rales, or rhonchi. Breathing is unlabored. Heart: Regular rhythm. No murmurs, rubs, or gallops. Msk:  Strength and tone normal for age. Extremities/Skin: Warm and dry.  Neuro: Alert and oriented X 3. Moves all extremities spontaneously. Gait is normal. CNII-XII grossly in tact. Psych:  Responds to questions appropriately with a normal affect.     ASSESSMENT AND PLAN:  24 y.o. year old female with  1. Bacterial respiratory infection She is to take antibiotic as directed. Also use Flonase for congestion in the nose. Can also use over-the-counter meds for decongestant and cough if needed. Note given for out of work Tuesday Wednesday Thursday. After that she is not due to go back to work until Sunday. Follow-up if symptoms do not resolve within 1 week after completion of antibiotic. - azithromycin (ZITHROMAX) 250 MG tablet; Day 1: Take 2 daily. Days 2-5: Take 1 daily.  Dispense: 6 tablet; Refill: 0 - fluticasone (FLONASE)  50 MCG/ACT nasal spray; Place 2 sprays into both nostrils daily.  Dispense: 16 g; Refill: [address]6   Signed, Taylyn Brame Beth GrantDixon, [place], [facility] 12/24/2014 2:38 PM

## 2015-01-14 LAB — HM DIABETES EYE EXAM

## 2015-01-17 ENCOUNTER — Telehealth: Payer: Self-pay | Admitting: Family Medicine

## 2015-01-17 NOTE — Telephone Encounter (Signed)
Pt's mother called and states that the pharmacy has cancelled

## 2015-01-24 ENCOUNTER — Encounter: Payer: Self-pay | Admitting: *Deleted

## 2015-01-31 ENCOUNTER — Telehealth: Payer: Self-pay | Admitting: Family Medicine

## 2015-01-31 NOTE — Telephone Encounter (Signed)
Patient is calling to see if her invocana can be switched to something different, she says it has gotten too expensive

## 2015-01-31 NOTE — Telephone Encounter (Signed)
Co-pay savings card sent to patient.

## 2015-02-21 ENCOUNTER — Other Ambulatory Visit: Payer: Self-pay | Admitting: Family Medicine

## 2015-02-21 NOTE — Telephone Encounter (Signed)
Refill appropriate and filled per protocol. 

## 2015-04-08 ENCOUNTER — Ambulatory Visit: Payer: BLUE CROSS/BLUE SHIELD | Admitting: Family Medicine

## 2015-04-12 ENCOUNTER — Ambulatory Visit: Payer: BLUE CROSS/BLUE SHIELD | Admitting: Family Medicine

## 2015-04-16 ENCOUNTER — Ambulatory Visit (INDEPENDENT_AMBULATORY_CARE_PROVIDER_SITE_OTHER): Payer: BLUE CROSS/BLUE SHIELD | Admitting: Family Medicine

## 2015-04-16 VITALS — BP 124/72 | HR 84 | Temp 98.2°F | Resp 16 | Ht 70.0 in | Wt 240.0 lb

## 2015-04-16 DIAGNOSIS — E669 Obesity, unspecified: Secondary | ICD-10-CM | POA: Diagnosis not present

## 2015-04-16 DIAGNOSIS — J309 Allergic rhinitis, unspecified: Secondary | ICD-10-CM | POA: Diagnosis not present

## 2015-04-16 DIAGNOSIS — E119 Type 2 diabetes mellitus without complications: Secondary | ICD-10-CM

## 2015-04-16 LAB — COMPLETE METABOLIC PANEL WITH GFR
ALBUMIN: 3.8 g/dL (ref 3.6–5.1)
ALK PHOS: 92 U/L (ref 33–115)
ALT: 5 U/L — ABNORMAL LOW (ref 6–29)
AST: 12 U/L (ref 10–30)
BUN: 7 mg/dL (ref 7–25)
CALCIUM: 9.1 mg/dL (ref 8.6–10.2)
CO2: 21 mmol/L (ref 20–31)
CREATININE: 0.73 mg/dL (ref 0.50–1.10)
Chloride: 109 mmol/L (ref 98–110)
GFR, Est Non African American: >89 mL/min (ref >=60)
Glucose, Bld: 91 mg/dL (ref 70–99)
POTASSIUM: 4.2 mmol/L (ref 3.5–5.3)
Sodium: 139 mmol/L (ref 135–146)
Total Bilirubin: 0.6 mg/dL (ref 0.2–1.2)
Total Protein: 7.3 g/dL (ref 6.1–8.1)

## 2015-04-16 LAB — STREP GROUP A AG, W/REFLEX TO CULT: STREGTOCOCCUS GROUP A AG SCREEN: NOT DETECTED

## 2015-04-16 NOTE — Progress Notes (Signed)
Patient ID: Samantha Lucas, female   DOB: 03/04/1990, 25 y.o.   MRN: [id number]   Subjective:    Patient ID: Samantha Lucas, female    DOB: 09/02/1990, 25 y.o.   MRN: [id number]  Patient presents for Follow-up Patient for follow-up of diabetes mellitus. Of note computer system was not working during time of visit. Her last A1c was 6.8%. She is intentionally lost 14 pounds since her last visit by change her dietary habits and she is also exercising at the local [employer]. Her 30 day average on her meter is 98 her fasting range from 85-111  She's not had any hypoglycemia. She has not been taking the Invokana, because she fell to continue to refill this. She has been taking the metformin.  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    Review Of Systems:  GEN- denies fatigue, fever, weight loss,weakness, recent illness HEENT- denies eye drainage, change in vision, +nasal discharge, CVS- denies chest pain, palpitations RESP- denies SOB, cough, wheeze ABD- denies N/V, change in stools, abd pain GU- denies dysuria, hematuria, dribbling, incontinence MSK- denies joint pain, muscle aches, injury Neuro- denies headache, dizziness, syncope, seizure activity       Objective:    BP 124/72 mmHg  Pulse 84  Temp(Src) 98.2 F (36.8 C)  Resp 16  Ht 5\' 10"  (1.778 m)  Wt 240 lb (108.863 kg)  BMI 34.44 kg/m2 GEN- NAD, alert and oriented x3 HEENT- PERRL, EOMI, non injected sclera, pink conjunctiva, MMM, oropharynx mild injection, no exudates, nares clear rhinorrhea  Neck- Supple, no thyromegaly CVS- RRR, no murmur RESP-CTAB EXT- No edema Pulses- Radial, DP- 2+        Assessment & Plan:      Problem List Items Addressed This Visit    Obesity   Diabetes mellitus, type II (HCC) - Primary    He has been intentionally losing weight and her blood sugars are much improved. We will see what  her A1c looks like on just the metformin she is also on low-dose lisinopril for renal protection      Relevant Orders   Hemoglobin A1c (Completed)   COMPLETE METABOLIC PANEL WITH GFR (Completed)    Other Visit Diagnoses    Allergic rhinitis, unspecified allergic rhinitis type        Flonase, Strep neg,more post nasal drip causing pain    Relevant Orders    STREP GROUP A AG, W/REFLEX TO CULT (Completed)       Note: This dictation was prepared with Dragon dictation along with smaller phrase technology. Any transcriptional errors that result from this process are unintentional.

## 2015-04-16 NOTE — Patient Instructions (Signed)
Continue current meds Add zyrtec for allergies Continue flonase for drainage F/U 4 months Physical

## 2015-04-17 ENCOUNTER — Encounter: Payer: Self-pay | Admitting: Family Medicine

## 2015-04-17 LAB — HEMOGLOBIN A1C
Hgb A1c MFr Bld: 6.4 % — ABNORMAL HIGH (ref <5.7)
Mean Plasma Glucose: 137 mg/dL — ABNORMAL HIGH (ref <117)

## 2015-04-17 NOTE — Assessment & Plan Note (Signed)
He has been intentionally losing weight and her blood sugars are much improved. We will see what her A1c looks like on just the metformin she is also on low-dose lisinopril for renal protection

## 2015-06-28 ENCOUNTER — Telehealth: Payer: Self-pay | Admitting: Family Medicine

## 2015-06-28 NOTE — Telephone Encounter (Signed)
Patient needs OV.

## 2015-06-28 NOTE — Telephone Encounter (Signed)
Patient calling to say that she is still sick after taking the zpak would like to know what to do from here  [phone]

## 2015-07-03 ENCOUNTER — Encounter: Payer: Self-pay | Admitting: Family Medicine

## 2015-07-03 ENCOUNTER — Ambulatory Visit (INDEPENDENT_AMBULATORY_CARE_PROVIDER_SITE_OTHER): Payer: BLUE CROSS/BLUE SHIELD | Admitting: Family Medicine

## 2015-07-03 DIAGNOSIS — J069 Acute upper respiratory infection, unspecified: Secondary | ICD-10-CM | POA: Diagnosis not present

## 2015-07-03 MED ORDER — AZITHROMYCIN 250 MG PO TABS: ORAL_TABLET | ORAL | Status: DC

## 2015-07-03 MED ORDER — GUAIFENESIN-CODEINE 100-10 MG/5ML PO SOLN: 5.0000 mL | Freq: Four times a day (QID) | ORAL | Status: DC | PRN

## 2015-07-03 NOTE — Progress Notes (Signed)
Patient ID: Samantha Lucas, female   DOB: 03/10/1990, 25 y.o.   MRN: [id number]    Subjective:    Patient ID: Samantha Lucas, female    DOB: 04/25/1990, 25 y.o.   MRN: [id number]  Patient presents for Illness Patient here with cough with mild production and runny nose started with a sore throat last week now has progressed with the cough. The cough is keeping her up at night. She does not have any difficulty breathing of fever no known sick contacts. She's been taking her allergy medicine as prescribed that is helped minimally with the runny nose. She's also taken some over-the-counter cough medicine with minimal improvement.  Note her blood sugars have been normal with her illness.  Review Of Systems:  GEN- denies fatigue, fever, weight loss,weakness, recent illness HEENT- denies eye drainage, change in vision, +nasal discharge, CVS- denies chest pain, palpitations RESP- denies SOB, +cough, wheeze ABD- denies N/V, change in stools, abd pain GU- denies dysuria, hematuria, dribbling, incontinence MSK- denies joint pain, muscle aches, injury Neuro- denies headache, dizziness, syncope, seizure activity       Objective:    BP 138/72 mmHg  Pulse 72  Temp(Src) 99.2 F (37.3 C) (Oral)  Resp 14  Ht 5\' 10"  (1.778 m)  Wt 244 lb (110.678 kg)  BMI 35.01 kg/m2  SpO2 97%  LMP 06/14/2015 (Approximate) GEN- NAD, alert and oriented x3 HEENT- PERRL, EOMI, non injected sclera, pink conjunctiva, MMM, oropharynx clear, nares clear rhinorrhea  Neck- Supple, no LAD  CVS- RRR, no murmur RESP-CTAB EXT- No edema Pulses- Radial2+        Assessment & Plan:      Problem List Items Addressed This Visit    None    Visit Diagnoses    URI, acute        URI, at risk for PNA/bronchitis due to history and DM. Given zpak to start if not better by this weekend,. continue allergy meds and rotbiussin AC    Relevant Medications    azithromycin (ZITHROMAX) 250 MG tablet       Note: This dictation was  prepared with Dragon dictation along with smaller phrase technology. Any transcriptional errors that result from this process are unintentional.

## 2015-07-03 NOTE — Patient Instructions (Addendum)
Start zpak if not better this weekend  Cough with codiene  F/U reschedule physical to Sept  Give note for work- 6/6 okay to return today

## 2015-07-04 ENCOUNTER — Other Ambulatory Visit: Payer: Self-pay | Admitting: Family Medicine

## 2015-07-04 NOTE — Telephone Encounter (Signed)
Refill appropriate and filled per protocol. 

## 2015-07-22 ENCOUNTER — Telehealth: Payer: Self-pay | Admitting: Family Medicine

## 2015-07-22 MED ORDER — FLUCONAZOLE 150 MG PO TABS: 150.0000 mg | ORAL_TABLET | Freq: Once | ORAL | Status: DC

## 2015-07-22 NOTE — Telephone Encounter (Signed)
Patient states she was seen on 6/62017 and was given antibiotic she now has a yeast infection from that antibiotic and would like pill for yeast infection [place] [facility] [place]. She states dr. Jeanice Limurham offered to call in prescription for yeast infection at that visit.  CB# [phone]

## 2015-07-22 NOTE — Telephone Encounter (Signed)
Prescription sent to pharmacy for Diflucan. Advised that if S/Sx do not resolve after dosage, OV will be required.  

## 2015-08-16 ENCOUNTER — Encounter: Payer: BLUE CROSS/BLUE SHIELD | Admitting: Family Medicine

## 2015-09-08 ENCOUNTER — Other Ambulatory Visit: Payer: Self-pay | Admitting: Family Medicine

## 2015-09-09 NOTE — Telephone Encounter (Signed)
Refill appropriate and filled per protocol. 

## 2015-09-26 ENCOUNTER — Other Ambulatory Visit: Payer: Self-pay | Admitting: Family Medicine

## 2015-09-27 NOTE — Telephone Encounter (Signed)
Patient was seen June 7th

## 2015-10-04 ENCOUNTER — Ambulatory Visit (INDEPENDENT_AMBULATORY_CARE_PROVIDER_SITE_OTHER): Payer: BLUE CROSS/BLUE SHIELD | Admitting: Family Medicine

## 2015-10-04 ENCOUNTER — Encounter: Payer: Self-pay | Admitting: Family Medicine

## 2015-10-04 VITALS — BP 128/64 | HR 74 | Temp 98.6°F | Resp 14 | Ht 70.0 in | Wt 254.0 lb

## 2015-10-04 DIAGNOSIS — Z0000 Encounter for general adult medical examination without abnormal findings: Secondary | ICD-10-CM | POA: Diagnosis not present

## 2015-10-04 DIAGNOSIS — E119 Type 2 diabetes mellitus without complications: Secondary | ICD-10-CM

## 2015-10-04 DIAGNOSIS — Z23 Encounter for immunization: Secondary | ICD-10-CM

## 2015-10-04 DIAGNOSIS — Z111 Encounter for screening for respiratory tuberculosis: Secondary | ICD-10-CM

## 2015-10-04 DIAGNOSIS — Z113 Encounter for screening for infections with a predominantly sexual mode of transmission: Secondary | ICD-10-CM

## 2015-10-04 DIAGNOSIS — Z124 Encounter for screening for malignant neoplasm of cervix: Secondary | ICD-10-CM

## 2015-10-04 DIAGNOSIS — D509 Iron deficiency anemia, unspecified: Secondary | ICD-10-CM

## 2015-10-04 DIAGNOSIS — E669 Obesity, unspecified: Secondary | ICD-10-CM

## 2015-10-04 LAB — LIPID PANEL
CHOL/HDL RATIO: 3.9 ratio (ref <=5.0)
CHOLESTEROL: 145 mg/dL (ref 125–200)
HDL: 37 mg/dL — ABNORMAL LOW (ref >=46)
LDL CALC: 90 mg/dL (ref <130)
Triglycerides: 89 mg/dL (ref <150)
VLDL: 18 mg/dL (ref <30)

## 2015-10-04 LAB — COMPREHENSIVE METABOLIC PANEL
ALK PHOS: 113 U/L (ref 33–115)
ALT: 7 U/L (ref 6–29)
AST: 10 U/L (ref 10–30)
Albumin: 3.6 g/dL (ref 3.6–5.1)
BILIRUBIN TOTAL: 0.2 mg/dL (ref 0.2–1.2)
BUN: 9 mg/dL (ref 7–25)
CALCIUM: 8.9 mg/dL (ref 8.6–10.2)
CO2: 24 mmol/L (ref 20–31)
CREATININE: 0.63 mg/dL (ref 0.50–1.10)
Chloride: 105 mmol/L (ref 98–110)
GLUCOSE: 173 mg/dL — AB (ref 70–99)
Potassium: 4.1 mmol/L (ref 3.5–5.3)
SODIUM: 138 mmol/L (ref 135–146)
Total Protein: 7.3 g/dL (ref 6.1–8.1)

## 2015-10-04 LAB — CBC WITH DIFFERENTIAL/PLATELET
BASOS ABS: 0 {cells}/uL (ref 0–200)
Basophils Relative: 0 %
EOS ABS: 92 {cells}/uL (ref 15–500)
EOS PCT: 1 %
HCT: 37 % (ref 35.0–45.0)
HEMOGLOBIN: 11.8 g/dL — AB (ref 12.0–15.0)
LYMPHS ABS: 2944 {cells}/uL (ref 850–3900)
Lymphocytes Relative: 32 %
MCH: 24.5 pg — AB (ref 27.0–33.0)
MCHC: 31.9 g/dL — ABNORMAL LOW (ref 32.0–36.0)
MCV: 76.8 fL — AB (ref 80.0–100.0)
MONO ABS: 644 {cells}/uL (ref 200–950)
MPV: 9.5 fL (ref 7.5–12.5)
Monocytes Relative: 7 %
NEUTROS ABS: 5520 {cells}/uL (ref 1500–7800)
Neutrophils Relative %: 60 %
Platelets: 380 10*3/uL (ref 140–400)
RBC: 4.82 MIL/uL (ref 3.80–5.10)
RDW: 15.4 % — ABNORMAL HIGH (ref 11.0–15.0)
WBC: 9.2 10*3/uL (ref 3.8–10.8)

## 2015-10-04 LAB — WET PREP FOR TRICH, YEAST, CLUE
CLUE CELLS WET PREP: NONE SEEN
TRICH WET PREP: NONE SEEN
YEAST WET PREP: NONE SEEN

## 2015-10-04 NOTE — Progress Notes (Signed)
Subjective:    Patient ID: Samantha Lucas July, female    DOB: 01/04/1991, 25 y.o.   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, needs PPD, still plans to go into nursing  Has noticed some itching externally in vaginal region and outer lipids has been treated for yeast few times in setting of DM       Review Of Systems:  GEN- denies fatigue, fever, weight loss,weakness, recent illness HEENT- denies eye drainage, change in vision, nasal discharge, CVS- denies chest pain, palpitations RESP- denies SOB, cough, wheeze ABD- denies N/V, change in stools, abd pain GU- denies dysuria, hematuria, dribbling, incontinence MSK- denies joint pain, muscle aches, injury Neuro- denies headache, dizziness, syncope, seizure activity       Objective:    BP 128/64 (BP Location: Right Arm, Patient Position: Sitting, Cuff Size: Large)   Pulse 74   Temp 98.6 F (37 C) (Oral)   Resp 14   Ht 5\' 10"  (1.778 m)   Wt 254 lb (115.2 kg)   LMP 09/23/2015   BMI 36.45 kg/m  GEN- NAD, alert and oriented x3 HEENT- PERRL, EOMI, non injected sclera, pink conjunctiva, MMM, oropharynx clear Neck- Supple, no thyromegaly CVS- RRR, no murmur RESP-CTAB Breast- normal symmetry, no nipple inversion,no nipple drainage, no nodules or lumps felt Nodes- no axillary nodes GI-NABS,soft,NT,ND  GU- normal external genitalia, vaginal mucosa pink and moist, cervix visualized no growth, no blood form os, minimal thin clear discharge, no CMT, no ovarian masses, uterus normal size EXT- No edema Pulses- Radial, DP- 2+        Assessment & Plan:      Problem List Items Addressed This Visit     Routine general medical examination at a health care facility - Primary    PAP Smear done, STD screen,fasting labs Flu shot  PPD placed for school TDAP UTD       Relevant Orders   CBC with Differential/Platelet (Completed)   Comprehensive metabolic panel (Completed)   Obesity   Microcytic anemia   Diabetes mellitus, type II (HCC)    Continue to reiterate importance of her diet and good diabetic control Goal < 7% preferably below 6.5% at her age On ACE       Relevant Orders   Hemoglobin A1c (Completed)   Lipid panel (Completed)    Other Visit Diagnoses    Cervical cancer screening       Relevant Orders   PAP, ThinPrep ASCUS Rflx HPV Rflx Type   Screen for STD (sexually transmitted disease)       Relevant Orders   WET PREP FOR TRICH, YEAST, CLUE (Completed)   HIV antibody (Completed)   GC/Chlamydia Probe Amp (Completed)   Need for prophylactic vaccination and inoculation against influenza       Relevant Orders   Flu Vaccine QUAD 36+ mos PF IM (Fluarix & Fluzone Quad PF) (Completed)   Screening-pulmonary TB       Relevant Orders   PPD (Completed)      Note: This dictation was prepared with Dragon dictation along with smaller Lobbyistphrase technology. Any transcriptional  errors that result from this process are unintentional.

## 2015-10-04 NOTE — Patient Instructions (Signed)
F/U 4 months RETURN Monday FOR PPD  Check by Nurse  I recommend eye visit once a year I recommend dental visit every 6 months Goal is to  Exercise 30 minutes 5 days a week We will send a letter with lab results

## 2015-10-05 ENCOUNTER — Encounter: Payer: Self-pay | Admitting: Family Medicine

## 2015-10-05 LAB — HEMOGLOBIN A1C
Hgb A1c MFr Bld: 8.1 % — ABNORMAL HIGH (ref <5.7)
Mean Plasma Glucose: 186 mg/dL

## 2015-10-05 LAB — GC/CHLAMYDIA PROBE AMP
CT Probe RNA: NOT DETECTED
GC Probe RNA: NOT DETECTED

## 2015-10-05 LAB — HIV ANTIBODY (ROUTINE TESTING W REFLEX): HIV 1&2 Ab, 4th Generation: NONREACTIVE

## 2015-10-05 NOTE — Assessment & Plan Note (Addendum)
PAP Smear done, STD screen,fasting labs Flu shot  PPD placed for school TDAP UTD  Discussed safe sex, contraception, she states she is now celibate

## 2015-10-05 NOTE — Assessment & Plan Note (Signed)
Continue to reiterate importance of her diet and good diabetic control Goal < 7% preferably below 6.5% at her age On ACE

## 2015-10-07 ENCOUNTER — Ambulatory Visit: Payer: BLUE CROSS/BLUE SHIELD | Admitting: Family Medicine

## 2015-10-07 DIAGNOSIS — Z111 Encounter for screening for respiratory tuberculosis: Secondary | ICD-10-CM

## 2015-10-07 LAB — PAP THINPREP ASCUS RFLX HPV RFLX TYPE

## 2015-10-07 LAB — TB SKIN TEST
Induration: 0 mm
TB Skin Test: NEGATIVE

## 2015-10-07 NOTE — Patient Instructions (Signed)
Pt came for reading of PPD TB skin test.  Site was negative for any redness or induration.  Pt states she was instructed to do a TB 2-step.  Told second test should be given 7-10 days after first.  She will return Friday for second test.

## 2015-10-10 ENCOUNTER — Other Ambulatory Visit: Payer: Self-pay | Admitting: *Deleted

## 2015-10-10 MED ORDER — CANAGLIFLOZIN 100 MG PO TABS: 100.0000 mg | ORAL_TABLET | Freq: Every day | ORAL | 3 refills | Status: DC

## 2015-10-11 ENCOUNTER — Ambulatory Visit (INDEPENDENT_AMBULATORY_CARE_PROVIDER_SITE_OTHER): Payer: BLUE CROSS/BLUE SHIELD

## 2015-10-11 DIAGNOSIS — Z111 Encounter for screening for respiratory tuberculosis: Secondary | ICD-10-CM | POA: Diagnosis not present

## 2015-10-11 NOTE — Patient Instructions (Signed)
Patient came in for second part two TB test. Explained to come back on 9/18 for second reading

## 2015-10-14 ENCOUNTER — Ambulatory Visit: Payer: BLUE CROSS/BLUE SHIELD

## 2015-10-14 LAB — TB SKIN TEST: TB SKIN TEST: NEGATIVE

## 2015-10-18 ENCOUNTER — Other Ambulatory Visit: Payer: BLUE CROSS/BLUE SHIELD

## 2015-10-18 DIAGNOSIS — Z0184 Encounter for antibody response examination: Secondary | ICD-10-CM

## 2015-10-19 LAB — HEPATITIS B SURFACE ANTIBODY, QUANTITATIVE

## 2015-10-21 LAB — MEASLES/MUMPS/RUBELLA IMMUNITY
MUMPS IGG: 23.5 [AU]/ml — AB (ref <9.00)
RUBELLA: 1.28 {index} — AB (ref <0.90)
Rubeola IgG: <25 AU/mL (ref <25.00)

## 2015-10-21 LAB — VARICELLA ZOSTER ANTIBODY, IGG: Varicella IgG: <135 Index (ref <135.00)

## 2015-10-25 ENCOUNTER — Ambulatory Visit (INDEPENDENT_AMBULATORY_CARE_PROVIDER_SITE_OTHER): Payer: BLUE CROSS/BLUE SHIELD

## 2015-10-25 DIAGNOSIS — Z23 Encounter for immunization: Secondary | ICD-10-CM

## 2015-12-10 ENCOUNTER — Other Ambulatory Visit: Payer: Self-pay | Admitting: Family Medicine

## 2015-12-18 ENCOUNTER — Telehealth: Payer: Self-pay | Admitting: *Deleted

## 2015-12-18 ENCOUNTER — Encounter: Payer: Self-pay | Admitting: Family Medicine

## 2015-12-18 ENCOUNTER — Ambulatory Visit (INDEPENDENT_AMBULATORY_CARE_PROVIDER_SITE_OTHER): Payer: BLUE CROSS/BLUE SHIELD | Admitting: Family Medicine

## 2015-12-18 VITALS — BP 132/74 | HR 100 | Temp 99.2°F | Resp 14 | Ht 70.0 in | Wt 256.0 lb

## 2015-12-18 DIAGNOSIS — J029 Acute pharyngitis, unspecified: Secondary | ICD-10-CM | POA: Diagnosis not present

## 2015-12-18 DIAGNOSIS — B9789 Other viral agents as the cause of diseases classified elsewhere: Secondary | ICD-10-CM | POA: Diagnosis not present

## 2015-12-18 DIAGNOSIS — J069 Acute upper respiratory infection, unspecified: Secondary | ICD-10-CM | POA: Diagnosis not present

## 2015-12-18 LAB — STREP GROUP A AG, W/REFLEX TO CULT: STREGTOCOCCUS GROUP A AG SCREEN: NOT DETECTED

## 2015-12-18 NOTE — Telephone Encounter (Signed)
Received call from patient.   Reports x1 day productive cough with yellow sputum, clear nasal drainage, and hoarseness. Denies fever/ sore throat/ muscle aches.   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   Advised to use OTC Mucinex or Robitussin and Delsym for cough, and nasal saline for nasal congestion. Advised to increase rest and to increase fluid intake. Recommended that if symptoms worsen or persist >1 week to contact office for visit.

## 2015-12-18 NOTE — Patient Instructions (Addendum)
Take cough medicine Use motrin or tylenol for fever Plenty of fluids   Viral Respiratory Infection Introduction A viral respiratory infection is an illness that affects parts of the body used for breathing, like the lungs, nose, and throat. It is caused by a germ called a virus. Some examples of this kind of infection are:  A cold.  The flu (influenza).  A respiratory syncytial virus (RSV) infection. How do I know if I have this infection? Most of the time this infection causes:  A stuffy or runny nose.  Yellow or green fluid in the nose.  A cough.  Sneezing.  Tiredness (fatigue).  Achy muscles.  A sore throat.  Sweating or chills.  A fever.  A headache. How is this infection treated? If the flu is diagnosed early, it may be treated with an antiviral medicine. This medicine shortens the length of time a person has symptoms. Symptoms may be treated with over-the-counter and prescription medicines, such as:  Expectorants. These make it easier to cough up mucus.  Decongestant nasal sprays. Doctors do not prescribe antibiotic medicines for viral infections. They do not work with this kind of infection. How do I know if I should stay home? To keep others from getting sick, stay home if you have:  A fever.  A lasting cough.  A sore throat.  A runny nose.  Sneezing.  Muscles aches.  Headaches.  Tiredness.  Weakness.  Chills.  Sweating.  An upset stomach (nausea). Follow these instructions at home:  Rest as much as possible.  Take over-the-counter and prescription medicines only as told by your doctor.  Drink enough fluid to keep your pee (urine) clear or pale yellow.  Gargle with salt water. Do this 3-4 times per day or as needed. To make a salt-water mixture, dissolve -1 tsp of salt in 1 cup of warm water. Make sure the salt dissolves all the way.  Use nose drops made from salt water. This helps with stuffiness (congestion). It also helps  soften the skin around your nose.  Do not drink alcohol.  Do not use tobacco products, including cigarettes, chewing tobacco, and e-cigarettes. If you need help quitting, ask your doctor. Get help if:  Your symptoms last for 10 days or longer.  Your symptoms get worse over time.  You have a fever.  You have very bad pain in your face or forehead.  Parts of your jaw or neck become very swollen. Get help right away if:  You feel pain or pressure in your chest.  You have shortness of breath.  You faint or feel like you will faint.  You keep throwing up (vomiting).  You feel confused. This information is not intended to replace advice given to you by your health care provider. Make sure you discuss any questions you have with your health care provider. Document Released: 12/26/2007 Document Revised: 06/20/2015 Document Reviewed: 06/20/2014  2017 Elsevier

## 2015-12-18 NOTE — Progress Notes (Signed)
   Subjective:    Patient ID: Samantha Lucas, female    DOB: 06/17/1990, 25 y.o.   MRN: [id number]  Patient presents for Illness (x1 day- productive cough with yellow sputum, clear nasal drainage, and hoarseness- + exposure to strep)     Cough with congestion and nasal congestion  started yesterday. +low grade fever, hoarse, no sore throat, +sick contacts at work. Using nasal spray  Took robitussin Codiene last night.       Review Of Systems:  GEN- denies fatigue, fever, weight loss,weakness, recent illness HEENT- denies eye drainage, change in vision, nasal discharge, CVS- denies chest pain, palpitations RESP- denies SOB,+ cough, wheeze ABD- denies N/V, change in stools, abd pain GU- denies dysuria, hematuria, dribbling, incontinence MSK- denies joint pain, muscle aches, injury Neuro- denies headache, dizziness, syncope, seizure activity       Objective:    BP 132/74 (BP Location: Left Arm, Patient Position: Sitting, Cuff Size: Large)   Pulse 100   Temp 99.2 F (37.3 C) (Oral)   Resp 14   Ht 5\' 10"  (1.778 m)   Wt 256 lb (116.1 kg)   SpO2 99%   BMI 36.73 kg/m  GEN- NAD, alert and oriented x3 HEENT- PERRL, EOMI, non injected sclera, pink conjunctiva, MMM, oropharynx clear TM clear bilat no effusion,  No maxillary sinus tenderness, +Nasal drainage , hoarse  Voice  Neck- Supple, no LAD CVS- RRR, no murmur RESP-CTAB EXT- No edema Pulses- Radial 2+         Assessment & Plan:      Problem List Items Addressed This Visit    None    Visit Diagnoses    Viral URI    -  Primary   Viral Illness, supportive care, Cough medicine already prescribed, rest fluids, throat lozenges,call if not better by Monday   Relevant Orders   STREP GROUP A AG, W/REFLEX TO CULT (Completed)   STREP GROUP A AG, W/REFLEX TO CULT      Note: This dictation was prepared with Dragon dictation along with smaller Lobbyistphrase technology. Any transcriptional errors that result from this process are  unintentional.

## 2015-12-20 LAB — CULTURE, GROUP A STREP: Organism ID, Bacteria: NORMAL

## 2016-01-06 ENCOUNTER — Ambulatory Visit: Payer: BLUE CROSS/BLUE SHIELD | Admitting: Family Medicine

## 2016-01-23 ENCOUNTER — Other Ambulatory Visit: Payer: Self-pay | Admitting: Family Medicine

## 2016-02-07 ENCOUNTER — Encounter: Payer: Self-pay | Admitting: Family Medicine

## 2016-02-07 ENCOUNTER — Ambulatory Visit (INDEPENDENT_AMBULATORY_CARE_PROVIDER_SITE_OTHER): Payer: BLUE CROSS/BLUE SHIELD | Admitting: Family Medicine

## 2016-02-07 VITALS — BP 130/78 | HR 82 | Temp 98.2°F | Resp 14 | Ht 70.0 in | Wt 255.0 lb

## 2016-02-07 DIAGNOSIS — E6609 Other obesity due to excess calories: Secondary | ICD-10-CM

## 2016-02-07 DIAGNOSIS — Z6836 Body mass index (BMI) 36.0-36.9, adult: Secondary | ICD-10-CM | POA: Diagnosis not present

## 2016-02-07 DIAGNOSIS — IMO0001 Reserved for inherently not codable concepts without codable children: Secondary | ICD-10-CM

## 2016-02-07 DIAGNOSIS — E119 Type 2 diabetes mellitus without complications: Secondary | ICD-10-CM

## 2016-02-07 LAB — CBC WITH DIFFERENTIAL/PLATELET
BASOS PCT: 0 %
Basophils Absolute: 0 cells/uL (ref 0–200)
EOS PCT: 1 %
Eosinophils Absolute: 91 cells/uL (ref 15–500)
HCT: 38.9 % (ref 35.0–45.0)
HEMOGLOBIN: 12.1 g/dL (ref 12.0–15.0)
LYMPHS ABS: 3094 {cells}/uL (ref 850–3900)
Lymphocytes Relative: 34 %
MCH: 23.9 pg — ABNORMAL LOW (ref 27.0–33.0)
MCHC: 31.1 g/dL — AB (ref 32.0–36.0)
MCV: 76.9 fL — ABNORMAL LOW (ref 80.0–100.0)
MPV: 9.4 fL (ref 7.5–12.5)
Monocytes Absolute: 728 cells/uL (ref 200–950)
Monocytes Relative: 8 %
NEUTROS ABS: 5187 {cells}/uL (ref 1500–7800)
Neutrophils Relative %: 57 %
Platelets: 363 10*3/uL (ref 140–400)
RBC: 5.06 MIL/uL (ref 3.80–5.10)
RDW: 16.8 % — ABNORMAL HIGH (ref 11.0–15.0)
WBC: 9.1 10*3/uL (ref 3.8–10.8)

## 2016-02-07 LAB — COMPREHENSIVE METABOLIC PANEL
ALT: 9 U/L (ref 6–29)
AST: 14 U/L (ref 10–30)
Albumin: 3.6 g/dL (ref 3.6–5.1)
Alkaline Phosphatase: 108 U/L (ref 33–115)
BILIRUBIN TOTAL: 0.5 mg/dL (ref 0.2–1.2)
BUN: 9 mg/dL (ref 7–25)
CO2: 24 mmol/L (ref 20–31)
CREATININE: 0.72 mg/dL (ref 0.50–1.10)
Calcium: 9.2 mg/dL (ref 8.6–10.2)
Chloride: 104 mmol/L (ref 98–110)
Glucose, Bld: 158 mg/dL — ABNORMAL HIGH (ref 70–99)
Potassium: 4.5 mmol/L (ref 3.5–5.3)
SODIUM: 137 mmol/L (ref 135–146)
TOTAL PROTEIN: 7.2 g/dL (ref 6.1–8.1)

## 2016-02-07 LAB — HEMOGLOBIN A1C
Hgb A1c MFr Bld: 9.6 % — ABNORMAL HIGH (ref <5.7)
MEAN PLASMA GLUCOSE: 229 mg/dL

## 2016-02-07 NOTE — Progress Notes (Signed)
   Subjective:    Patient ID: Samantha Lucas, female    DOB: 03/03/1990, 26 y.o.   MRN: [id number]  Patient presents for 3 month F/U (is fasting) Patient to follow-up diabetes mellitus last A1c was elevated at 8.1% she was restarted on Invokana in addition to her metformin. She's also on low-dose ACE inhibitor. Her cholesterol is normal. She's been working on dietary changes which is been an issue. She's had diabetic nutritionist. Her weight is unchanged from 3 months ago She continues to have dietary issues with eating fast food and eating late at night. She states she will skip meals during the day and then fill very hungry in the evening time therefore having more snacks and calories than she should. Her sister is here with her today.  Her seven-day average on her glucometer is 140 her highest blood sugar 169 her 30 day average is 140 however the past 30 days she's only had 10 readings noted per her meter. She admits to not checking her blood sugar on a regular basis No hypoglycemia, feels well    Review Of Systems:  GEN- denies fatigue, fever, weight loss,weakness, recent illness HEENT- denies eye drainage, change in vision, nasal discharge, CVS- denies chest pain, palpitations RESP- denies SOB, cough, wheeze ABD- denies N/V, change in stools, abd pain GU- denies dysuria, hematuria, dribbling, incontinence MSK- denies joint pain, muscle aches, injury Neuro- denies headache, dizziness, syncope, seizure activity       Objective:    BP 130/78 (BP Location: Left Arm, Patient Position: Sitting, Cuff Size: Large)   Pulse 82   Temp 98.2 F (36.8 C) (Oral)   Resp 14   Ht 5\' 10"  (1.778 m)   Wt 255 lb (115.7 kg)   LMP 01/31/2016 (Approximate) Comment: regular  SpO2 99%   BMI 36.59 kg/m  GEN- NAD, alert and oriented x3 HEENT- PERRL, EOMI, non injected sclera, pink conjunctiva, MMM, oropharynx clear Neck- Supple, no thyromegaly CVS- RRR, no murmur RESP-CTAB ABD-NABS,soft,NT,ND EXT-  No edema Pulses- Radial, DP- 2+        Assessment & Plan:      Problem List Items Addressed This Visit    Obesity   Diabetes mellitus, type II (HCC) - Primary    Continue to reiterate the dietary component to her diabetes. She is seeing a diabetic nutritionist does not want to return for another one. We discussed using her my fitness [employer] Again when she was monitoring her food and lost anything she was losing weight and her blood sugars are much better. I've given her resources again with regards to food groups as well as portion sizes. We'll check her A1c and her metabolic panel today the goal of A1c 6.5% Actually think that she will be up to come off her medications if she would adhere to her diet and lose weight      Relevant Orders   CBC with Differential/Platelet   Comprehensive metabolic panel   Hemoglobin A1c      Note: This dictation was prepared with Dragon dictation along with smaller phrase technology. Any transcriptional errors that result from this process are unintentional.

## 2016-02-07 NOTE — Patient Instructions (Signed)
F/U 3 months  We will call with lab results   

## 2016-02-07 NOTE — Assessment & Plan Note (Signed)
Continue to reiterate the dietary component to her diabetes. She is seeing a diabetic nutritionist does not want to return for another one. We discussed using her my fitness [employer] Again when she was monitoring her food and lost anything she was losing weight and her blood sugars are much better. I've given her resources again with regards to food groups as well as portion sizes. We'll check her A1c and her metabolic panel today the goal of A1c 6.5% Actually think that she will be up to come off her medications if she would adhere to her diet and lose weight

## 2016-02-11 ENCOUNTER — Other Ambulatory Visit: Payer: Self-pay | Admitting: *Deleted

## 2016-02-11 MED ORDER — METFORMIN HCL 850 MG PO TABS: 850.0000 mg | ORAL_TABLET | Freq: Two times a day (BID) | ORAL | 1 refills | Status: DC

## 2016-03-09 ENCOUNTER — Other Ambulatory Visit: Payer: Self-pay | Admitting: Family Medicine

## 2016-04-21 ENCOUNTER — Other Ambulatory Visit: Payer: Self-pay | Admitting: Family Medicine

## 2016-04-21 MED ORDER — LISINOPRIL 2.5 MG PO TABS: ORAL_TABLET | ORAL | 6 refills | Status: DC

## 2016-05-08 ENCOUNTER — Ambulatory Visit: Payer: BLUE CROSS/BLUE SHIELD | Admitting: Family Medicine

## 2016-06-09 IMAGING — CT CT ANGIO CHEST
1 of 6 series · 5 of 36 positions shown · IV contrast (omnipaque)
Comparison: Chest radiograph-earlier same day

CLINICAL DATA: Shortness of breath. Chest pressure. Swelling of the
feet. History of diabetes. Evaluate for pulmonary embolism.

EXAM:
CT ANGIOGRAPHY CHEST WITH CONTRAST
TECHNIQUE: Multidetector CT imaging of the chest was performed using the
standard protocol during bolus administration of intravenous
contrast. Multiplanar CT image reconstructions and MIPs were
obtained to evaluate the vascular anatomy.
CONTRAST:  100mL OMNIPAQUE IOHEXOL 350 MG/ML SOLN

[Series 4: pe 3.0 b40f · axial · 0.72mm/px · z∈[+962,+1109]mm · 5 of 75 slices shown]
[im 13/75  lung]
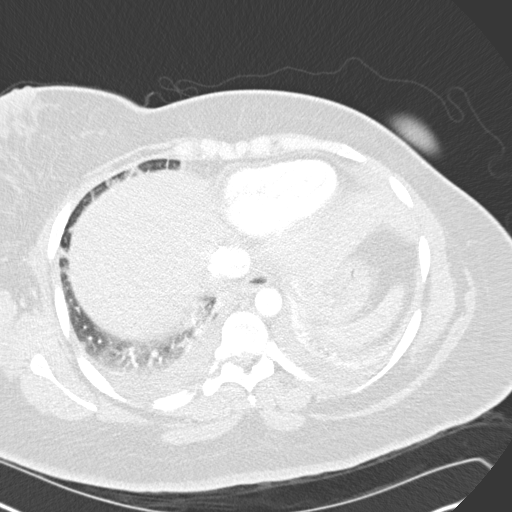
[im 25/75  mediastinal]
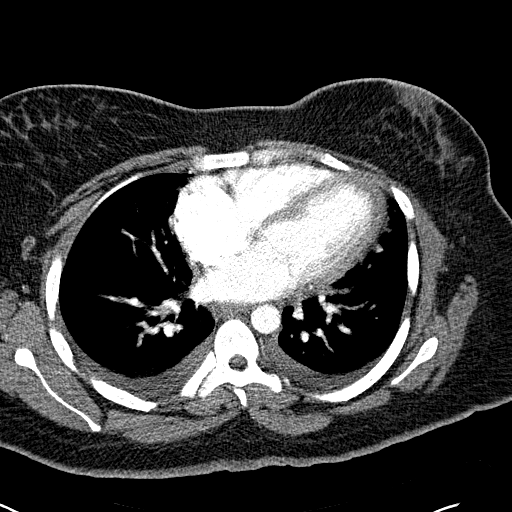
[im 38/75  lung]
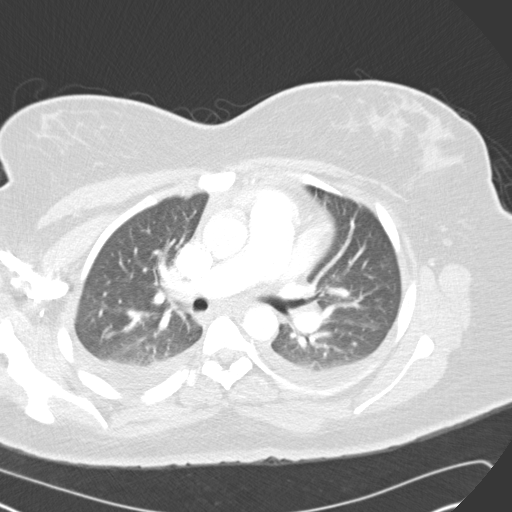
[im 50/75  mediastinal]
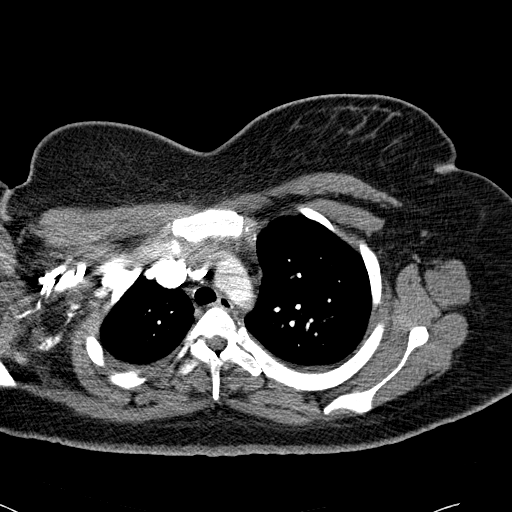
[im 62/75  lung]
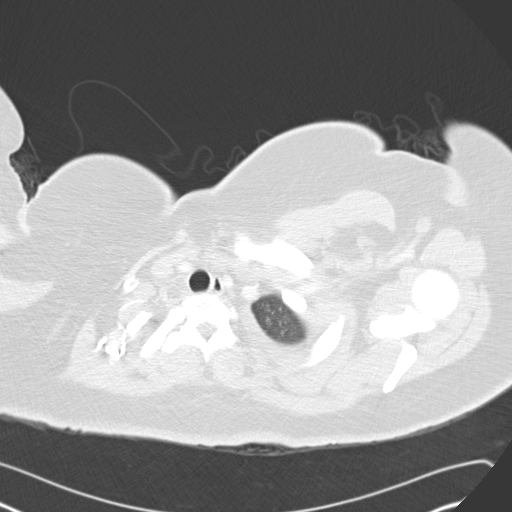

[5 of 36 positions shown; findings below may reference images not displayed]

FINDINGS: Vascular Findings:

There is adequate opacification of the pulmonary arterial system
with the main pulmonary artery measuring 289 Hounsfield units. There
no discrete filling defects within the pulmonary arterial tree to
the level of the bilateral subsegmental pulmonary arteries.
Evaluation of the distal subsegmental pulmonary arteries is degraded
secondary to a combination of patient respiratory artifact and
quantum mottle due to patient body habitus.

Borderline enlarged caliber the main pulmonary artery measuring 31
mm in diameter.

Cardiomegaly. No pericardial effusion. Normal caliber of the
thoracic aorta. Bovine configuration of the aortic arch is
incidentally noted. The branch vessels of the arch appear widely
patent throughout their imaged course. No thoracic aortic dissection
or periaortic stranding.

Review of the MIP images confirms the above findings.

----------------------------------------------------------------------------------

Nonvascular Findings:

Evaluation of the pulmonary parenchyma is degraded secondary to a
combination of patient respiratory artifact and quantum mottle due
to patient body habitus.

Small bilateral pleural effusions. Minimal dependent subpleural
ground-glass atelectasis. No discrete focal airspace opacities. The
central pulmonary airways appear patent. No pneumothorax.

There is a minimal amount of ill-defined soft tissue within the
anterior mediastinum which is favored to represent residual thymic
tissue.

No discrete pulmonary nodules given limitation of the examination.

Shotty bilateral axillary lymph nodes are individually not enlarged
by size criteria and presumably reactive due to patient body
habitus. No mediastinal, hilar axillary lymphadenopathy.

Limited early arterial phase evaluation of the upper abdomen is
normal. No acute or aggressive osseous abnormalities.

Regional soft tissues appear normal.
IMPRESSION: 1. No evidence of pulmonary embolism.
2. Cardiomegaly with trace bilateral effusions - constellation of
findings suggestive of early pulmonary edema. Clinical correlation
is advised.
3. Borderline enlarged caliber of the main pulmonary artery,
nonspecific though could be seen in the setting of pulmonary
arterial hypertension. Further evaluation with cardiac echo could be
performed as clinically indicated.

## 2016-06-09 IMAGING — DX DG CHEST 2V
2 series · 2 of 2 positions shown · non-contrast
Comparison: Thoracic spine series of March 25, 2011

CLINICAL DATA: Two days of nonproductive cough associated with
shortness of breath and chest congestion, history of diabetes

EXAM:
CHEST  2 VIEW

[chest pa]
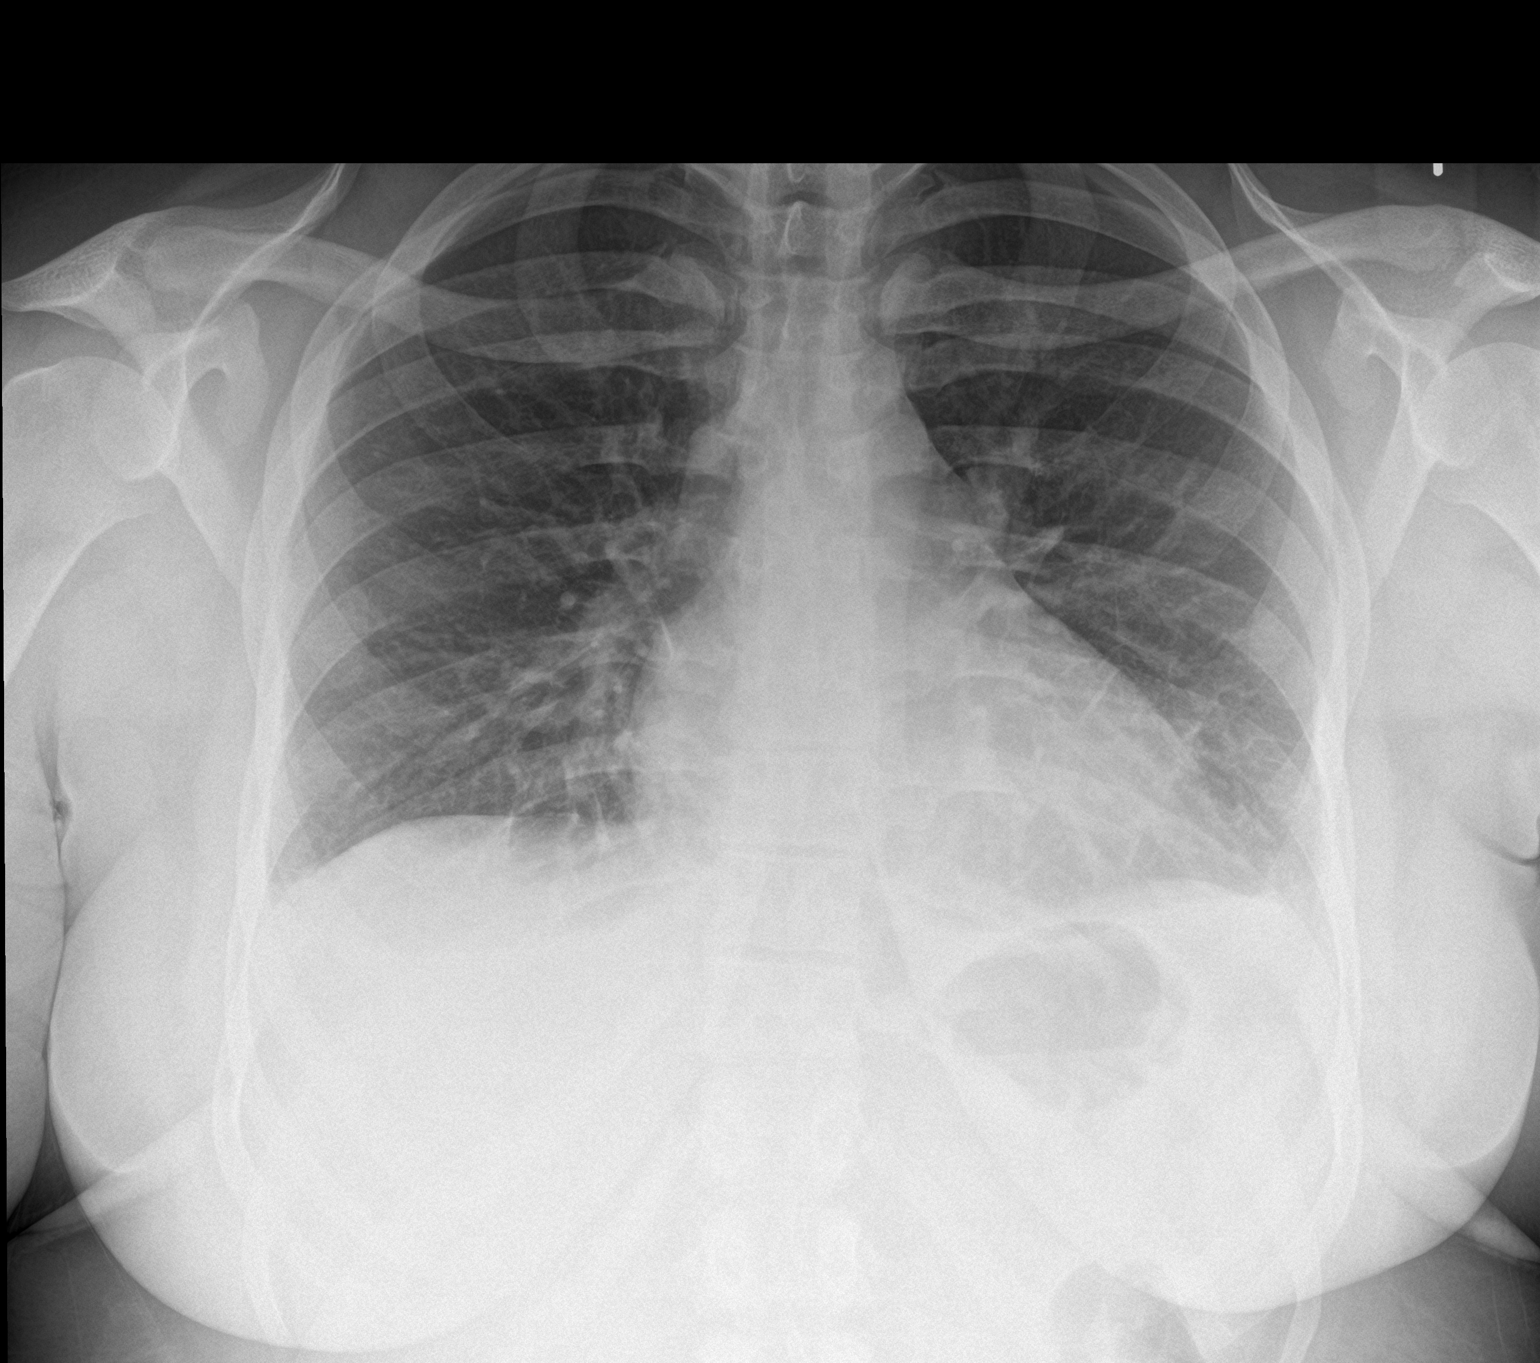

[chest lat]
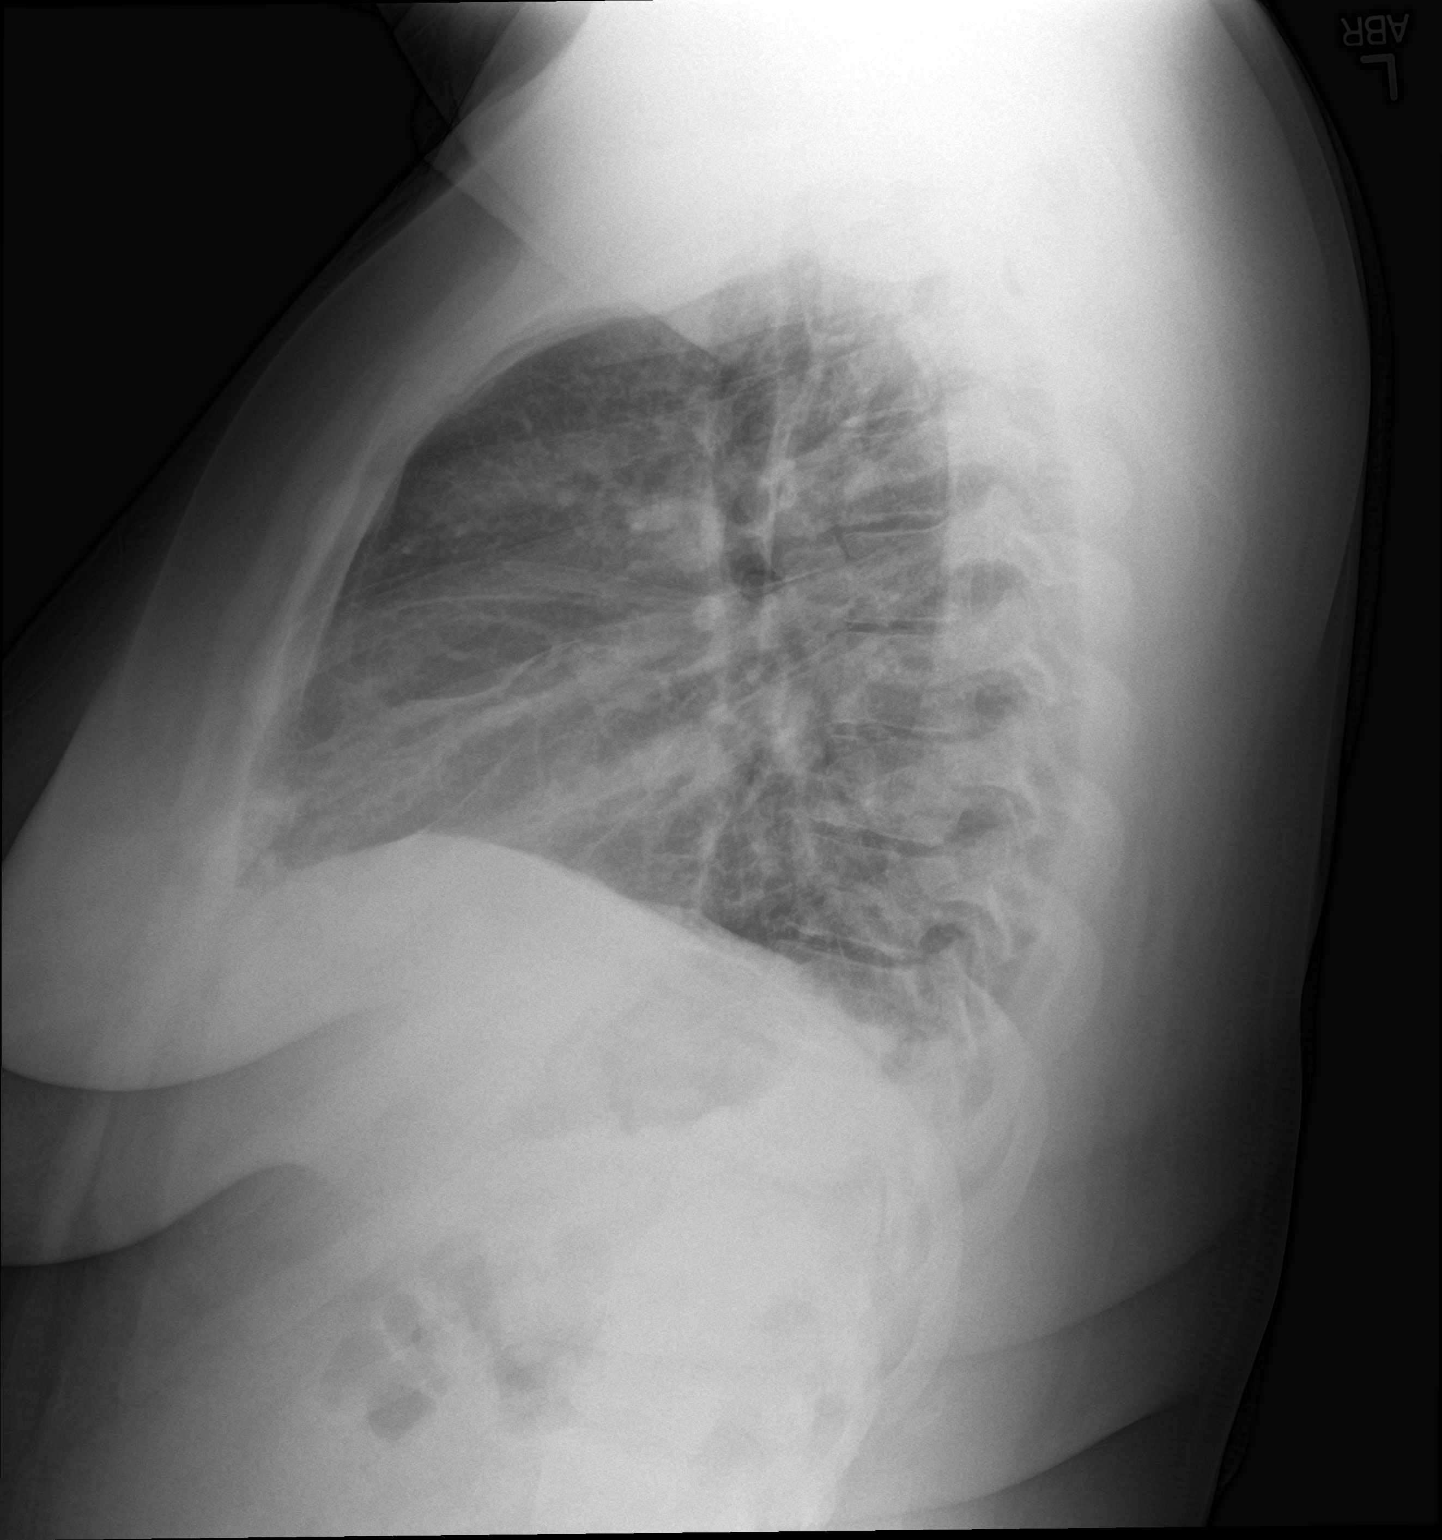

[2 of 2 positions shown; findings below may reference images not displayed]

FINDINGS: The lungs are adequately inflated. The interstitial markings are
increased bilaterally. There is no alveolar infiltrate. There is a
small amount of pleural fluid on the left posteriorly. The cardiac
silhouette is top-normal in size. The central pulmonary vascularity
is mildly prominent. The trachea is midline. The bony thorax
exhibits no acute abnormality.
IMPRESSION: Bibasilar subsegmental atelectasis versus early interstitial
pneumonia. Follow-up radiographs following anticipated antibiotic
therapy are recommended to assure clearing.

## 2016-06-29 ENCOUNTER — Other Ambulatory Visit: Payer: Self-pay | Admitting: Family Medicine

## 2016-06-29 NOTE — Telephone Encounter (Signed)
Refill appropriate 

## 2016-11-17 DIAGNOSIS — Z23 Encounter for immunization: Secondary | ICD-10-CM | POA: Diagnosis not present

## 2016-12-23 ENCOUNTER — Ambulatory Visit: Payer: BLUE CROSS/BLUE SHIELD | Admitting: Family Medicine

## 2016-12-23 ENCOUNTER — Encounter: Payer: Self-pay | Admitting: Family Medicine

## 2016-12-23 ENCOUNTER — Other Ambulatory Visit: Payer: Self-pay

## 2016-12-23 VITALS — BP 128/76 | HR 80 | Temp 98.9°F | Ht 70.0 in | Wt 253.0 lb

## 2016-12-23 DIAGNOSIS — E119 Type 2 diabetes mellitus without complications: Secondary | ICD-10-CM | POA: Diagnosis not present

## 2016-12-23 DIAGNOSIS — Z6836 Body mass index (BMI) 36.0-36.9, adult: Secondary | ICD-10-CM | POA: Diagnosis not present

## 2016-12-23 MED ORDER — LISINOPRIL 2.5 MG PO TABS: ORAL_TABLET | ORAL | 6 refills | Status: DC

## 2016-12-23 MED ORDER — METFORMIN HCL 1000 MG PO TABS: ORAL_TABLET | ORAL | 3 refills | Status: DC

## 2016-12-23 NOTE — Patient Instructions (Signed)
F/U 3 months Restart metformin 500mg  twice a day for 2 weeks , then increase 1 tablet twice a day  Check CBG daily

## 2016-12-23 NOTE — Progress Notes (Signed)
   Subjective:    Patient ID: Samantha Lucas, female    DOB: 07/26/1990, 26 y.o.   MRN: [id number]  Patient presents for Follow-up (is fasting)   Here to f/u DM, she has been off her medications for a few months, was on MTF and Invokana. States she could not afford the Invokana Has not checked CBG Not following diet or working out, but wants to get back on track No concerns today     Review Of Systems:  GEN- denies fatigue, fever, weight loss,weakness, recent illness HEENT- denies eye drainage, change in vision, nasal discharge, CVS- denies chest pain, palpitations RESP- denies SOB, cough, wheeze ABD- denies N/V, change in stools, abd pain GU- denies dysuria, hematuria, dribbling, incontinence MSK- denies joint pain, muscle aches, injury Neuro- denies headache, dizziness, syncope, seizure activity       Objective:    BP 128/76   Pulse 80   Temp 98.9 F (37.2 C)   Ht 5\' 10"  (1.778 m)   Wt 253 lb (114.8 kg)   SpO2 99%   BMI 36.30 kg/m  GEN- NAD, alert and oriented x3 HEENT- PERRL, EOMI, non injected sclera, pink conjunctiva, MMM, oropharynx clear CVS- RRR, no murmur RESP-CTAB EXT- No edema Pulses- Radial, DP- 2+        Assessment & Plan:      Problem List Items Addressed This Visit      Unprioritized   Obesity   Relevant Medications   metFORMIN (GLUCOPHAGE) 1000 MG tablet   Diabetes mellitus, type II (HCC) - Primary    Check A1C, goal will be less than 7% Restart metformin at 500mg  BID for 2 wereks then up to 1000mg  BID Hold on Invokana for now Restart lisinopril Foot exam done Unable to get eye exam right now Urine micro Dicussed dietary changes, [facility], weight loss       Relevant Medications   lisinopril (PRINIVIL,ZESTRIL) 2.5 MG tablet   metFORMIN (GLUCOPHAGE) 1000 MG tablet   Other Relevant Orders   CBC with Differential/Platelet (Completed)   Comprehensive metabolic panel (Completed)   Lipid panel (Completed)   Microalbumin / creatinine urine ratio (Completed)   HM DIABETES FOOT EXAM (Completed)   Hemoglobin A1c      Note: This dictation was prepared with Dragon dictation along with smaller phrase technology. Any transcriptional errors that result from this process are unintentional.

## 2016-12-24 ENCOUNTER — Other Ambulatory Visit: Payer: Self-pay | Admitting: *Deleted

## 2016-12-24 ENCOUNTER — Encounter: Payer: Self-pay | Admitting: Family Medicine

## 2016-12-24 MED ORDER — BLOOD GLUCOSE TEST VI STRP: ORAL_STRIP | 11 refills | Status: DC

## 2016-12-24 NOTE — Assessment & Plan Note (Signed)
Check A1C, goal will be less than 7% Restart metformin at 500mg  BID for 2 wereks then up to 1000mg  BID Hold on Invokana for now Restart lisinopril Foot exam done Unable to get eye exam right now Urine micro Dicussed dietary changes, [facility], weight loss

## 2016-12-28 LAB — COMPREHENSIVE METABOLIC PANEL
AG Ratio: 1.1 (calc) (ref 1.0–2.5)
ALBUMIN MSPROF: 3.6 g/dL (ref 3.6–5.1)
ALKALINE PHOSPHATASE (APISO): 132 U/L — AB (ref 33–115)
ALT: 8 U/L (ref 6–29)
AST: 11 U/L (ref 10–30)
BILIRUBIN TOTAL: 0.4 mg/dL (ref 0.2–1.2)
BUN: 9 mg/dL (ref 7–25)
CALCIUM: 9.1 mg/dL (ref 8.6–10.2)
CO2: 21 mmol/L (ref 20–32)
CREATININE: 0.62 mg/dL (ref 0.50–1.10)
Chloride: 101 mmol/L (ref 98–110)
Globulin: 3.4 g/dL (calc) (ref 1.9–3.7)
Glucose, Bld: 302 mg/dL — ABNORMAL HIGH (ref 65–99)
Potassium: 4.4 mmol/L (ref 3.5–5.3)
Sodium: 136 mmol/L (ref 135–146)
Total Protein: 7 g/dL (ref 6.1–8.1)

## 2016-12-28 LAB — CBC WITH DIFFERENTIAL/PLATELET
BASOS PCT: 0.5 %
Basophils Absolute: 50 cells/uL (ref 0–200)
EOS ABS: 100 {cells}/uL (ref 15–500)
Eosinophils Relative: 1 %
HCT: 40 % (ref 35.0–45.0)
HEMOGLOBIN: 12.5 g/dL (ref 11.7–15.5)
Lymphs Abs: 3380 cells/uL (ref 850–3900)
MCH: 23.6 pg — AB (ref 27.0–33.0)
MCHC: 31.3 g/dL — ABNORMAL LOW (ref 32.0–36.0)
MCV: 75.5 fL — ABNORMAL LOW (ref 80.0–100.0)
MONOS PCT: 7.3 %
MPV: 10.5 fL (ref 7.5–12.5)
NEUTROS ABS: 5740 {cells}/uL (ref 1500–7800)
Neutrophils Relative %: 57.4 %
Platelets: 368 10*3/uL (ref 140–400)
RBC: 5.3 10*6/uL — AB (ref 3.80–5.10)
RDW: 15.4 % — ABNORMAL HIGH (ref 11.0–15.0)
Total Lymphocyte: 33.8 %
WBC mixed population: 730 cells/uL (ref 200–950)
WBC: 10 10*3/uL (ref 3.8–10.8)

## 2016-12-28 LAB — LIPID PANEL
CHOL/HDL RATIO: 4.9 (calc) (ref <5.0)
Cholesterol: 176 mg/dL (ref <200)
HDL: 36 mg/dL — AB (ref >50)
LDL CHOLESTEROL (CALC): 120 mg/dL — AB
Non-HDL Cholesterol (Calc): 140 mg/dL (calc) — ABNORMAL HIGH (ref <130)
Triglycerides: 102 mg/dL (ref <150)

## 2016-12-28 LAB — MICROALBUMIN / CREATININE URINE RATIO
Creatinine, Urine: 124 mg/dL (ref 20–275)
MICROALB UR: 0.3 mg/dL
Microalb Creat Ratio: 2 mcg/mg creat (ref <30)

## 2016-12-28 LAB — HEMOGLOBIN A1C
EAG (MMOL/L): 19 (calc)
Hgb A1c MFr Bld: 13.6 % of total Hgb — ABNORMAL HIGH (ref <5.7)
MEAN PLASMA GLUCOSE: 344 (calc)

## 2017-01-06 ENCOUNTER — Other Ambulatory Visit: Payer: Self-pay

## 2017-01-06 ENCOUNTER — Encounter: Payer: Self-pay | Admitting: Family Medicine

## 2017-01-06 ENCOUNTER — Ambulatory Visit: Payer: BLUE CROSS/BLUE SHIELD | Admitting: Family Medicine

## 2017-01-06 VITALS — BP 130/78 | HR 82 | Temp 98.1°F | Resp 16 | Ht 70.0 in | Wt 263.0 lb

## 2017-01-06 DIAGNOSIS — B9689 Other specified bacterial agents as the cause of diseases classified elsewhere: Secondary | ICD-10-CM

## 2017-01-06 DIAGNOSIS — M545 Low back pain, unspecified: Secondary | ICD-10-CM

## 2017-01-06 DIAGNOSIS — N760 Acute vaginitis: Secondary | ICD-10-CM | POA: Diagnosis not present

## 2017-01-06 DIAGNOSIS — N898 Other specified noninflammatory disorders of vagina: Secondary | ICD-10-CM | POA: Diagnosis not present

## 2017-01-06 DIAGNOSIS — Z111 Encounter for screening for respiratory tuberculosis: Secondary | ICD-10-CM

## 2017-01-06 LAB — URINALYSIS, ROUTINE W REFLEX MICROSCOPIC
Bilirubin Urine: NEGATIVE
HGB URINE DIPSTICK: NEGATIVE
Ketones, ur: NEGATIVE
LEUKOCYTES UA: NEGATIVE
NITRITE: NEGATIVE
PH: 6.5 (ref 5.0–8.0)
Protein, ur: NEGATIVE
SPECIFIC GRAVITY, URINE: 1.015 (ref 1.001–1.03)

## 2017-01-06 LAB — WET PREP FOR TRICH, YEAST, CLUE

## 2017-01-06 MED ORDER — FLUCONAZOLE 150 MG PO TABS: ORAL_TABLET | ORAL | 1 refills | Status: DC

## 2017-01-06 MED ORDER — CYCLOBENZAPRINE HCL 10 MG PO TABS: 10.0000 mg | ORAL_TABLET | Freq: Three times a day (TID) | ORAL | 0 refills | Status: DC | PRN

## 2017-01-06 MED ORDER — METRONIDAZOLE 500 MG PO TABS: 500.0000 mg | ORAL_TABLET | Freq: Two times a day (BID) | ORAL | 0 refills | Status: DC

## 2017-01-06 NOTE — Progress Notes (Signed)
   Subjective:    Patient ID: Samantha Lucas, female    DOB: 09/04/1990, 26 y.o.   MRN: [id number]  Patient presents for L Sided Lower Back Pain (x2 days- pain in lower back radiating to middle and R side) and Vaginal Discharge (states that she has discoloration and discharge- denies itchin)  Here with left-sided lower back pain for the past 2 days.  No particular injury but she has had low back pain in the same area a few times before after muscle strain.  It does go a little into her buttocks she felt a little twinge in the right side in the beginning as well.  She denies any tingling numbness in her feet denies any change in her bowel or bladder but would like to have her urine checked.  She does have some vaginal discharge she did complete Diflucan recently for yeast infection in the setting of her diabetes mellitus which is uncontrolled.  She has no glucosuria she recently restarted her metformin.  Her CBGs recently have been 118 the highest up to 275 over the past 2 weeks.  The past 2 days have been 130s-140s fasting.  She has been trying to increase her water intake.   Review Of Systems:  GEN- denies fatigue, fever, weight loss,weakness, recent illness HEENT- denies eye drainage, change in vision, nasal discharge, CVS- denies chest pain, palpitations RESP- denies SOB, cough, wheeze ABD- denies N/V, change in stools, abd pain GU- denies dysuria, hematuria, dribbling, incontinence MSK-+ joint pain, muscle aches, injury Neuro- denies headache, dizziness, syncope, seizure activity       Objective:    BP 130/78   Pulse 82   Temp 98.1 F (36.7 C) (Oral)   Resp 16   Ht 5\' 10"  (1.778 m)   Wt 263 lb (119.3 kg)   SpO2 99%   BMI 37.74 kg/m  GEN- NAD, alert and oriented x3 ABD-NABS,soft,NT,ND, no CVA tenderness MSK- TTP left parapinals, pain with flexion, neg SLR bilat , Spine NT Neuro-normal tone LE, sensation in tact, DTR symmetric EXT- No edema Pulses- Radial, DP- 2+  Personal  Blind wet prep done       Assessment & Plan:      Problem List Items Addressed This Visit    None    Visit Diagnoses    BV (bacterial vaginosis)    -  Primary   Relevant Medications   metroNIDAZOLE (FLAGYL) 500 MG tablet   fluconazole (DIFLUCAN) 150 MG tablet   Other Relevant Orders   Urinalysis, Routine w reflex microscopic (Completed)   Wet Prep for Trick, Yeast, Clue (Completed)   Lumbar back pain       MSK pain, flexeril, motrin given. For her blood sugars, continue MTF continues to improve, though she is still spilling a good amount of glucose, has f/u in 2 weeks Treat infection per above   Relevant Medications   cyclobenzaprine (FLEXERIL) 10 MG tablet      Note: This dictation was prepared with Dragon dictation along with smaller phrase technology. Any transcriptional errors that result from this process are unintentional.

## 2017-01-06 NOTE — Patient Instructions (Addendum)
F/U as previous Continue the metformin  Take flagyl, then take the diflucan  F/U Friday for nurse visit

## 2017-01-07 NOTE — Addendum Note (Signed)
Addended by: Phillips OdorSIX, Tramayne Sebesta H on: 01/07/2017 10:54 AM   Modules accepted: Orders

## 2017-01-08 ENCOUNTER — Ambulatory Visit: Payer: BLUE CROSS/BLUE SHIELD

## 2017-01-08 DIAGNOSIS — Z111 Encounter for screening for respiratory tuberculosis: Secondary | ICD-10-CM

## 2017-01-08 LAB — TB SKIN TEST
INDURATION: 0 mm
TB SKIN TEST: NEGATIVE

## 2017-01-08 NOTE — Progress Notes (Signed)
Patient was in office for a tb skin test read. Test is negative

## 2017-01-27 ENCOUNTER — Ambulatory Visit: Payer: BLUE CROSS/BLUE SHIELD | Admitting: Family Medicine

## 2017-01-27 ENCOUNTER — Encounter: Payer: Self-pay | Admitting: Family Medicine

## 2017-01-27 ENCOUNTER — Other Ambulatory Visit: Payer: Self-pay

## 2017-01-27 VITALS — BP 128/78 | HR 88 | Temp 97.9°F | Resp 14 | Ht 70.0 in | Wt 251.0 lb

## 2017-01-27 DIAGNOSIS — E119 Type 2 diabetes mellitus without complications: Secondary | ICD-10-CM | POA: Diagnosis not present

## 2017-01-27 DIAGNOSIS — J209 Acute bronchitis, unspecified: Secondary | ICD-10-CM | POA: Diagnosis not present

## 2017-01-27 MED ORDER — GUAIFENESIN-CODEINE 100-10 MG/5ML PO SOLN: 5.0000 mL | Freq: Four times a day (QID) | ORAL | 0 refills | Status: DC | PRN

## 2017-01-27 MED ORDER — AZITHROMYCIN 250 MG PO TABS: ORAL_TABLET | ORAL | 0 refills | Status: DC

## 2017-01-27 NOTE — Patient Instructions (Signed)
Take antibiotics  Take cough medicine  Mucinex twice a day  F/U 3 months

## 2017-01-27 NOTE — Progress Notes (Signed)
   Subjective:    Patient ID: Samantha Lucas, female    DOB: 07/11/1990, 27 y.o.   MRN: [id number]  Patient presents for Follow-up (is fasting) and Illness (x 10 day- productive cough, congestion)   Cough with production for since christmas, no fever, sinus pressure/drainage. Taking Vitmain C, herbal teas, nasal steroid. Not breaking up. Cough keeps her up at night No wheezing. No known sick contacts      DM- she lost her meter, has not been checking.Metformin and lisinoprl , ate [employer] last night and missed pill   weight down 12lbs   A1C 6 weeks ago 13.6%     Review Of Systems:  GEN- denies fatigue, fever, weight loss,weakness, recent illness HEENT- denies eye drainage, change in vision, +nasal discharge, CVS- denies chest pain, palpitations RESP- denies SOB, +cough, wheeze ABD- denies N/V, change in stools, abd pain GU- denies dysuria, hematuria, dribbling, incontinence MSK- denies joint pain, muscle aches, injury Neuro- denies headache, dizziness, syncope, seizure activity       Objective:    BP 128/78   Pulse 88   Temp 97.9 F (36.6 C) (Oral)   Resp 14   Ht 5\' 10"  (1.778 m)   Wt 251 lb (113.9 kg)   SpO2 98%   BMI 36.01 kg/m  GEN- NAD, alert and oriented x3 HEENT- PERRL, EOMI, non injected sclera, pink conjunctiva, MMM, oropharynx mild injection, TM clear bilat no effusion, No maxillary sinus tenderness, clear nares  Neck- Supple, no LAD CVS- RRR, no murmur RESP-mild rhonchi, no rales, no wheeze, normal WOB EXT- No edema Pulses- Radial 2+          Assessment & Plan:      Problem List Items Addressed This Visit      Unprioritized   Diabetes mellitus, type II (HCC) - Primary    Check her renal function.  She has been working diligently to lose weight and is down 12 pounds intentionally.  Check interim A1c at the 6-week.  And see how she is progressing.  She is currently on metformin alone.  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 Treat wtih zpak, robitussin AC, mucinex, flonase      Note: This dictation was prepared with Dragon dictation along with smaller phrase technology. Any transcriptional errors that result from this process are unintentional.

## 2017-01-27 NOTE — Assessment & Plan Note (Signed)
Check her renal function.  She has been working diligently to lose weight and is down 12 pounds intentionally.  Check interim A1c at the 6-week.  And see how she is progressing.  She is currently on metformin alone.  She may be a good candidate for 1 of the injectables also continue to help with her weight loss such as Victoza or the once weekly Ozempic

## 2017-01-28 LAB — BASIC METABOLIC PANEL
BUN: 13 mg/dL (ref 7–25)
CO2: 26 mmol/L (ref 20–32)
Calcium: 9.4 mg/dL (ref 8.6–10.2)
Chloride: 103 mmol/L (ref 98–110)
Creat: 0.61 mg/dL (ref 0.50–1.10)
GLUCOSE: 252 mg/dL — AB (ref 65–99)
POTASSIUM: 4.5 mmol/L (ref 3.5–5.3)
SODIUM: 137 mmol/L (ref 135–146)

## 2017-01-28 LAB — HEMOGLOBIN A1C
HEMOGLOBIN A1C: 12.5 %{Hb} — AB (ref <5.7)
MEAN PLASMA GLUCOSE: 312 (calc)
eAG (mmol/L): 17.3 (calc)

## 2017-02-04 ENCOUNTER — Other Ambulatory Visit: Payer: Self-pay | Admitting: *Deleted

## 2017-02-04 MED ORDER — LIRAGLUTIDE 18 MG/3ML ~~LOC~~ SOPN: PEN_INJECTOR | SUBCUTANEOUS | 3 refills | Status: DC

## 2017-02-22 ENCOUNTER — Other Ambulatory Visit: Payer: Self-pay | Admitting: *Deleted

## 2017-02-22 MED ORDER — INSULIN PEN NEEDLE 32G X 4 MM MISC: 1 refills | Status: DC

## 2017-03-26 ENCOUNTER — Other Ambulatory Visit: Payer: Self-pay

## 2017-03-26 ENCOUNTER — Ambulatory Visit (INDEPENDENT_AMBULATORY_CARE_PROVIDER_SITE_OTHER): Payer: BLUE CROSS/BLUE SHIELD | Admitting: Family Medicine

## 2017-03-26 ENCOUNTER — Encounter: Payer: Self-pay | Admitting: Family Medicine

## 2017-03-26 VITALS — BP 124/78 | HR 98 | Temp 98.1°F | Resp 16 | Wt 254.4 lb

## 2017-03-26 DIAGNOSIS — E119 Type 2 diabetes mellitus without complications: Secondary | ICD-10-CM | POA: Diagnosis not present

## 2017-03-26 NOTE — Assessment & Plan Note (Addendum)
Diabetes is uncontrolled, reiterated diet Went over food choices with her today On metformin and vicotoza and has gained weight She has completed diabetic education classes already Pending A1C may need to take basal insulin.  She did not tolerate SGLT-2 Concern for more weight gain with sulfonylurea

## 2017-03-26 NOTE — Progress Notes (Signed)
   Subjective:    Patient ID: Samantha Lucas, female    DOB: 05/01/1990, 27 y.o.   MRN: [id number]  Patient presents for 3 month follow up   Pt here to f/u diabetes   Has gained her weight , last check 190, highest 430, did not take her medications that day.  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  Metformin 1000mg  twice a day, Victoza 1.2mg  daily  A1C last  12.5%  Cholesterol last done in Nov LDL was 120  On Lisinopril 2.5mg   Taking Vitamin C and MVI  No concerns today      Review Of Systems:  GEN- denies fatigue, fever, weight loss,weakness, recent illness HEENT- denies eye drainage, change in vision, nasal discharge, CVS- denies chest pain, palpitations RESP- denies SOB, cough, wheeze ABD- denies N/V, change in stools, abd pain GU- denies dysuria, hematuria, dribbling, incontinence MSK- denies joint pain, muscle aches, injury Neuro- denies headache, dizziness, syncope, seizure activity       Objective:    BP 124/78   Pulse 98   Temp 98.1 F (36.7 C) (Oral)   Resp 16   Wt 254 lb 6.4 oz (115.4 kg)   LMP 03/13/2017   SpO2 98%   BMI 36.50 kg/m  GEN- NAD, alert and oriented x3 HEENT- PERRL, EOMI, non injected sclera, pink conjunctiva, MMM, oropharynx clear CVS- RRR, no murmur RESP-CTAB EXT- No edema Pulses- Radial,2+        Assessment & Plan:      Problem List Items Addressed This Visit      Unprioritized   Diabetes mellitus, type II (HCC) - Primary    Diabetes is uncontrolled, reiterated diet Went over food choices with her today On metformin and vicotoza and has gained weight She has completed diabetic education classes already Pending A1C may need to take basal insulin.  She did not tolerate SGLT-2 Concern for more weight gain with sulfonylurea       Relevant Orders   Basic metabolic panel    Hemoglobin A1c   Lipid panel      Note: This dictation was prepared with Dragon dictation along with smaller phrase technology. Any transcriptional errors that result from this process are unintentional.

## 2017-03-26 NOTE — Patient Instructions (Addendum)
We will call with lab results  F/U 3 MONTHS Physical

## 2017-03-27 LAB — BASIC METABOLIC PANEL
BUN: 9 mg/dL (ref 7–25)
CO2: 23 mmol/L (ref 20–32)
CREATININE: 0.58 mg/dL (ref 0.50–1.10)
Calcium: 9.3 mg/dL (ref 8.6–10.2)
Chloride: 104 mmol/L (ref 98–110)
GLUCOSE: 194 mg/dL — AB (ref 65–99)
Potassium: 4.4 mmol/L (ref 3.5–5.3)
Sodium: 136 mmol/L (ref 135–146)

## 2017-03-27 LAB — HEMOGLOBIN A1C
EAG (MMOL/L): 13.6 (calc)
HEMOGLOBIN A1C: 10.2 %{Hb} — AB (ref <5.7)
MEAN PLASMA GLUCOSE: 246 (calc)

## 2017-03-27 LAB — LIPID PANEL
CHOL/HDL RATIO: 3.7 (calc) (ref <5.0)
CHOLESTEROL: 150 mg/dL (ref <200)
HDL: 41 mg/dL — AB (ref >50)
LDL Cholesterol (Calc): 89 mg/dL (calc)
Non-HDL Cholesterol (Calc): 109 mg/dL (calc) (ref <130)
Triglycerides: 100 mg/dL (ref <150)

## 2017-04-01 ENCOUNTER — Other Ambulatory Visit: Payer: Self-pay | Admitting: *Deleted

## 2017-04-01 MED ORDER — LIRAGLUTIDE 18 MG/3ML ~~LOC~~ SOPN: 1.8000 mg | PEN_INJECTOR | Freq: Every day | SUBCUTANEOUS | 3 refills | Status: DC

## 2017-04-27 ENCOUNTER — Other Ambulatory Visit: Payer: Self-pay | Admitting: Family Medicine

## 2017-05-31 ENCOUNTER — Other Ambulatory Visit: Payer: Self-pay | Admitting: Family Medicine

## 2017-06-03 ENCOUNTER — Encounter: Payer: Self-pay | Admitting: Family Medicine

## 2017-06-03 ENCOUNTER — Ambulatory Visit: Payer: BLUE CROSS/BLUE SHIELD | Admitting: Family Medicine

## 2017-06-03 ENCOUNTER — Other Ambulatory Visit: Payer: Self-pay

## 2017-06-03 VITALS — BP 128/80 | HR 82 | Temp 98.8°F | Ht 70.0 in | Wt 256.1 lb

## 2017-06-03 DIAGNOSIS — K219 Gastro-esophageal reflux disease without esophagitis: Secondary | ICD-10-CM | POA: Diagnosis not present

## 2017-06-03 DIAGNOSIS — E1165 Type 2 diabetes mellitus with hyperglycemia: Secondary | ICD-10-CM

## 2017-06-03 DIAGNOSIS — R109 Unspecified abdominal pain: Secondary | ICD-10-CM | POA: Diagnosis not present

## 2017-06-03 LAB — PREGNANCY, URINE: Preg Test, Ur: NEGATIVE

## 2017-06-03 LAB — URINALYSIS, ROUTINE W REFLEX MICROSCOPIC
BILIRUBIN URINE: NEGATIVE
GLUCOSE, UA: NEGATIVE
Hgb urine dipstick: NEGATIVE
LEUKOCYTES UA: NEGATIVE
NITRITE: NEGATIVE
PH: 7 (ref 5.0–8.0)
Protein, ur: NEGATIVE
SPECIFIC GRAVITY, URINE: 1.015 (ref 1.001–1.03)

## 2017-06-03 LAB — GLUCOSE 16585: Glucose: 201 mg/dL — ABNORMAL HIGH (ref 65–99)

## 2017-06-03 MED ORDER — OMEPRAZOLE 20 MG PO CPDR: 20.0000 mg | DELAYED_RELEASE_CAPSULE | Freq: Every day | ORAL | 0 refills | Status: DC

## 2017-06-03 NOTE — Patient Instructions (Signed)
For indigestion you can try Pepcid or Zantac over-the-counter, he can also try Mylanta.   If this does not work I have prescribed omeprazole for you, this works in a different way and you need to take it on an empty stomach in the morning at least 1 hour prior to eating, it decreases how much acid is produced by her stomach.  You need to stop eating late at night, stop eating greasy, fried or [place] fat foods also avoid spicy or acidic foods, see handout below.  Try to stop eating several hours prior to bedtime, you can also elevate the head your bed, decrease reflux of stomach contents.  I will call you with lab and x-ray results  If your abdominal pain in the right upper quadrant worsen, he comes more constant, or is more frequently occurring with eating call us so I can get you a right upper quadrant ultrasound ordered.  Right now I do not have any tenderness in that area and it is unlikely that you have an inflamed gallbladder.   Since her abdominal pain has improved it is much more difficult to say that it was nothing that was worsening.  It could be gallbladder issues, acid reflux, indigestion, gastric or peptic ulcers, a GI virus, or something you ate that did not agree with you.  Follow-up if not improving in 1 to 2 weeks.       Abdominal Pain, Adult Many things can cause belly (abdominal) pain. Most times, belly pain is not dangerous. Many cases of belly pain can be watched and treated at home. Sometimes belly pain is serious, though. Your doctor will try to find the cause of your belly pain. Follow these instructions at home:  Take over-the-counter and prescription medicines only as told by your doctor. Do not take medicines that help you poop (laxatives) unless told to by your doctor.  Drink enough fluid to keep your pee (urine) clear or pale yellow.  Watch your belly pain for any changes.  Keep all follow-up visits as told by your doctor. This is important. Contact a doctor  if:  Your belly pain changes or gets worse.  You are not hungry, or you lose weight without trying.  You are having trouble pooping (constipated) or have watery poop (diarrhea) for more than 2-3 days.  You have pain when you pee or poop.  Your belly pain wakes you up at night.  Your pain gets worse with meals, after eating, or with certain foods.  You are throwing up and cannot keep anything down.  You have a fever. Get help right away if:  Your pain does not go away as soon as your doctor says it should.  You cannot stop throwing up.  Your pain is only in areas of your belly, such as the right side or the left lower part of the belly.  You have bloody or black poop, or poop that looks like tar.  You have very bad pain, cramping, or bloating in your belly.  You have signs of not having enough fluid or water in your body (dehydration), such as: ? Dark pee, very little pee, or no pee. ? Cracked lips. ? Dry mouth. ? Sunken eyes. ? Sleepiness. ? Weakness. This information is not intended to replace advice given to you by your health care provider. Make sure you discuss any questions you have with your health care provider. Document Released: 07/01/2007 Document Revised: 08/02/2015 Document Reviewed: 06/26/2015 Elsevier Interactive Patient Education  2018  [employer]     Gastroesophageal Reflux Disease, Adult Normally, food travels down the esophagus and stays in the stomach to be digested. If a person has gastroesophageal reflux disease (GERD), food and stomach acid move back up into the esophagus. When this happens, the esophagus becomes sore and swollen (inflamed). Over time, GERD can make small holes (ulcers) in the lining of the esophagus. Follow these instructions at home: Diet  Follow a diet as told by your doctor. You may need to avoid foods and drinks such as: ? Coffee and tea (with or without caffeine). ? Drinks that contain alcohol. ? Energy drinks and  sports drinks. ? Carbonated drinks or sodas. ? Chocolate and cocoa. ? Peppermint and mint flavorings. ? Garlic and onions. ? Horseradish. ? Spicy and acidic foods, such as peppers, chili powder, curry powder, vinegar, hot sauces, and BBQ sauce. ? Citrus fruit juices and citrus fruits, such as oranges, lemons, and limes. ? Tomato-based foods, such as red sauce, chili, salsa, and pizza with red sauce. ? Fried and fatty foods, such as donuts, french fries, potato chips, and [place]-fat dressings. ? [place]-fat meats, such as hot dogs, rib eye steak, sausage, ham, and bacon. ? [place]-fat dairy items, such as whole milk, butter, and cream cheese.  Eat small meals often. Avoid eating large meals.  Avoid drinking large amounts of liquid with your meals.  Avoid eating meals during the 2-3 hours before bedtime.  Avoid lying down right after you eat.  Do not exercise right after you eat. General instructions  Pay attention to any changes in your symptoms.  Take over-the-counter and prescription medicines only as told by your doctor. Do not take aspirin, ibuprofen, or other NSAIDs unless your doctor says it is okay.  Do not use any tobacco products, including cigarettes, chewing tobacco, and e-cigarettes. If you need help quitting, ask your doctor.  Wear loose clothes. Do not wear anything tight around your waist.  Raise (elevate) the head of your bed about 6 inches (15 cm).  Try to lower your stress. If you need help doing this, ask your doctor.  If you are overweight, lose an amount of weight that is healthy for you. Ask your doctor about a safe weight loss goal.  Keep all follow-up visits as told by your doctor. This is important. Contact a doctor if:  You have new symptoms.  You lose weight and you do not know why it is happening.  You have trouble swallowing, or it hurts to swallow.  You have wheezing or a cough that keeps happening.  Your symptoms do not get better with  treatment.  You have a hoarse voice. Get help right away if:  You have pain in your arms, neck, jaw, teeth, or back.  You feel sweaty, dizzy, or light-headed.  You have chest pain or shortness of breath.  You throw up (vomit) and your throw up looks like blood or coffee grounds.  You pass out (faint).  Your poop (stool) is bloody or black.  You cannot swallow, drink, or eat. This information is not intended to replace advice given to you by your health care provider. Make sure you discuss any questions you have with your health care provider. Document Released: 07/01/2007 Document Revised: 06/20/2015 Document Reviewed: 05/09/2014 Elsevier Interactive Patient Education  2018 [employer]. Food Choices for Gastroesophageal Reflux Disease, Adult When you have gastroesophageal reflux disease (GERD), the foods you eat and your eating habits are very important. Choosing the right foods can  help ease your discomfort. What guidelines do I need to follow?  Choose fruits, vegetables, whole grains, and low-fat dairy products.  Choose low-fat meat, fish, and poultry.  Limit fats such as oils, salad dressings, butter, nuts, and avocado.  Keep a food diary. This helps you identify foods that cause symptoms.  Avoid foods that cause symptoms. These may be different for everyone.  Eat small meals often instead of 3 large meals a day.  Eat your meals slowly, in a place where you are relaxed.  Limit fried foods.  Cook foods using methods other than frying.  Avoid drinking alcohol.  Avoid drinking large amounts of liquids with your meals.  Avoid bending over or lying down until 2-3 hours after eating. What foods are not recommended? These are some foods and drinks that may make your symptoms worse: Vegetables Tomatoes. Tomato juice. Tomato and spaghetti sauce. Chili peppers. Onion and garlic. Horseradish. Fruits Oranges, grapefruit, and lemon (fruit and juice). Meats [place]-fat  meats, fish, and poultry. This includes hot dogs, ribs, ham, sausage, salami, and bacon. Dairy Whole milk and chocolate milk. Sour cream. Cream. Butter. Ice cream. Cream cheese. Drinks Coffee and tea. Bubbly (carbonated) drinks or energy drinks. Condiments Hot sauce. Barbecue sauce. Sweets/Desserts Chocolate and cocoa. Donuts. Peppermint and spearmint. Fats and Oils Short-fat foods. This includes [place] fries and potato chips. Other Vinegar. Strong spices. This includes black pepper, white pepper, red pepper, cayenne, curry powder, cloves, ginger, and chili powder. The items listed above may not be a complete list of foods and drinks to avoid. Contact your dietitian for more information. This information is not intended to replace advice given to you by your health care provider. Make sure you discuss any questions you have with your health care provider. Document Released: 07/14/2011 Document Revised: 06/20/2015 Document Reviewed: 11/16/2012 Elsevier Interactive Patient Education  2017 [employer].

## 2017-06-03 NOTE — Progress Notes (Signed)
Patient ID: Samantha Lucas, female    DOB: Nov 15, 1990, 27 y.o.   MRN: [id number]  PCP: Salley Scarlet, MD  Chief Complaint  Patient presents with  . Abdominal Pain    Patient in today with RUQ abdominal pain. Onset 2-3 days. Also has c/o nausea. States has not ate has full feeling.    Subjective:   Samantha Lucas is a 27 y.o. female, presents to clinic with CC of abdominal pain for the past 2 days.  Her pain suddenly began 2 days ago while eating a spicy chicken sandwich and continued thereafter.  Her pain was sharp located to her right upper quadrant without radiation, severity rated 8 out of 10, with associated with bloating, indigestion, belching and acid reflux.  Pain never completely went away and it continues to be dull in that same area rated 5 out of 10 and does get worse with eating particularly eating greasy, fried or spicy food.  She is never had these symptoms before, she had no associated nausea, vomiting, sweats, fever.  She did have decreased appetite with the bloated feeling to her stomach for 2 days now has improved today after having 3 softer bowel movements.  She has not eaten for over a day and her sugars are elevated.  She states she was also out of her Victoza medicine for over a week and just refilled it and started taking it yesterday.  Her fasting glucose this morning was 206.  She denies any urinary symptoms, including no dysuria, hematuria, urinary frequency urgency, no nocturia, polydipsia.  No weight loss.  She also denies any vaginal symptoms.  LMP was May 25, 2017.   She denies any history of GERD, gallstones, gastroparesis.  She does endorse some constipation, but she feels that is better. No known sick contacts.     Patient Active Problem List   Diagnosis Date Noted  . 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 Routine general medical examination at a health care facility 11/14/2013  . Microcytic anemia 07/17/2013  . Candidiasis, intertrigo 06/24/2012  .  Diabetes mellitus, type II (HCC) 06/24/2012  . Back pain 03/23/2011  . Obesity 03/23/2011  . SCOLIOSIS-IDIOPATHIC 09/05/2007     Prior to Admission medications   Medication Sig Start Date End Date Taking? Authorizing Provider  calcium gluconate 500 MG tablet Take 1 tablet by mouth 3 (three) times daily.   Yes [provider]  fluticasone (FLONASE) 50 MCG/ACT nasal spray Place 2 sprays into both nostrils daily. 12/24/14  Yes Allayne Butcher B, PA-C  Glucose Blood (BLOOD GLUCOSE TEST STRIPS) STRP Use as directed to monitor FSBS 2x daily. Dx: E11.9. 12/24/16  Yes [place], Velna Hatchet, MD  Insulin Pen Needle 32G X 4 MM MISC Use as directed with Victoza. 02/22/17  Yes [place], Velna Hatchet, MD  liraglutide (VICTOZA) 18 MG/3ML SOPN Inject 0.3 mLs (1.8 mg total) into the skin daily. 04/01/17  Yes [place], Velna Hatchet, MD  lisinopril (PRINIVIL,ZESTRIL) 2.5 MG tablet TAKE 1 TABLET BY MOUTH EVERY DAY TO PROTECT KIDNEYS 12/23/16  Yes [place], Velna Hatchet, MD  metFORMIN (GLUCOPHAGE) 1000 MG tablet TAKE 1 TABLET BY MOUTH TWICE DAILY WITH A MEAL 05/31/17  Yes , Velna Hatchet, MD  Multiple Vitamin (MULITIVITAMIN WITH MINERALS) TABS Take 1 tablet by mouth daily.   Yes [provider]  vitamin C (ASCORBIC ACID) 500 MG tablet Take 500 mg by mouth daily.   Yes [provider]  ferrous sulfate 325 (65 FE) MG tablet Take  1 tablet (325 mg total) by mouth 3 (three) times daily with meals. Patient not taking: Reported on 03/26/2017 07/18/13   Salley Scarlet, MD     No Known Allergies   Family History  Problem Relation Age of Onset  . Hypertension Mother   . Diabetes Mother   . Stroke Father   . Hypertension Father   . Cancer Paternal Grandmother        lung and breast cancer  . Hypertension Paternal Grandmother      Social History   Socioeconomic History  . Marital status: Single    Spouse name: Not on file  . Number of children: Not on file  . Years of education: Not on file  . Highest  education level: Not on file  Occupational History  . Not on file  Social Needs  . Financial resource strain: Not on file  . Food insecurity:    Worry: Not on file    Inability: Not on file  . Transportation needs:    Medical: Not on file    Non-medical: Not on file  Tobacco Use  . Smoking status: Never Smoker  . Smokeless tobacco: Never Used  Substance and Sexual Activity  . Alcohol use: No  . Drug use: No  . Sexual activity: Never  Lifestyle  . Physical activity:    Days per week: Not on file    Minutes per session: Not on file  . Stress: Not on file  Relationships  . Social connections:    Talks on phone: Not on file    Gets together: Not on file    Attends religious service: Not on file    Active member of club or organization: Not on file    Attends meetings of clubs or organizations: Not on file    Relationship status: Not on file  . Intimate partner violence:    Fear of current or ex partner: Not on file    Emotionally abused: Not on file    Physically abused: Not on file    Forced sexual activity: Not on file  Other Topics Concern  . Not on file  Social History Narrative  . Not on file     Review of Systems  Constitutional: Positive for appetite change. Negative for activity change, chills, diaphoresis, fatigue, fever and unexpected weight change.  HENT: Negative.   Eyes: Negative.   Respiratory: Negative.  Negative for cough, chest tightness and wheezing.   Cardiovascular: Negative.  Negative for chest pain, palpitations and leg swelling.  Gastrointestinal: Negative for anal bleeding, blood in stool, nausea, rectal pain and vomiting.  Endocrine: Negative.   Genitourinary: Negative.   Musculoskeletal: Negative.   Skin: Negative.  Negative for color change, pallor and rash.  Allergic/Immunologic: Negative.   Neurological: Negative.   Hematological: Negative.   Psychiatric/Behavioral: Negative.   All other systems reviewed and are negative.        Objective:    Vitals:   06/03/17 1439  BP: 128/80  Pulse: 82  Temp: 98.8 F (37.1 C)  TempSrc: Oral  SpO2: 97%  Weight: 256 lb 2 oz (116.2 kg)  Height:  (1.778 m)      Physical Exam  Constitutional: She is oriented to person, place, and time. She appears well-developed and well-nourished.  Non-toxic appearance. She does not appear ill. No distress.  Well-appearing young female, no acute distress, nontoxic-appearing  HENT:  Head: Normocephalic and atraumatic.  Right Ear: External ear normal.  Left Ear: External  ear normal.  Nose: Nose normal.  Mouth/Throat: Uvula is midline, oropharynx is clear and moist and mucous membranes are normal. No oropharyngeal exudate.  Eyes: Pupils are equal, round, and reactive to light. Conjunctivae, EOM and lids are normal. No scleral icterus.  Neck: Normal range of motion and phonation normal. Neck supple. No tracheal deviation present.  Cardiovascular: Normal rate, regular rhythm, normal heart sounds, intact distal pulses and normal pulses. Exam reveals no gallop and no friction rub.  No murmur heard. Pulses:      Radial pulses are 2+ on the right side, and 2+ on the left side.       Posterior tibial pulses are 2+ on the right side, and 2+ on the left side.  Pulmonary/Chest: Effort normal and breath sounds normal. No stridor. No respiratory distress. She has no wheezes. She has no rhonchi. She has no rales. She exhibits no tenderness.  Abdominal: Soft. Normal appearance and bowel sounds are normal. She exhibits no distension, no abdominal bruit, no pulsatile midline mass and no mass. There is no hepatosplenomegaly. There is tenderness in the right upper quadrant. There is no rigidity, no rebound, no guarding, no CVA tenderness, no tenderness at McBurney's point and negative Murphy's sign. No hernia.  Obese abdomen, nondistended, normal bowel sounds x4, with very deep palpation in the right upper quadrant she states it is tender but there is no  rebound or guarding  Musculoskeletal: Normal range of motion. She exhibits no edema or deformity.  Lymphadenopathy:    She has no cervical adenopathy.  Neurological: She is alert and oriented to person, place, and time. She exhibits normal muscle tone. Gait normal.  Skin: Skin is warm, dry and intact. Capillary refill takes less than 2 seconds. No rash noted. She is not diaphoretic. No pallor.  Psychiatric: She has a normal mood and affect. Her speech is normal and behavior is normal.  Nursing note and vitals reviewed.     Results for orders placed or performed in visit on 06/03/17 (from the past 24 hour(s))  Urinalysis, Routine w reflex microscopic     Status: Abnormal   Collection Time: 06/03/17  3:02 PM  Result Value Ref Range   Color, Urine YELLOW YELLOW   APPearance CLEAR CLEAR   Specific Gravity, Urine 1.015 1.001 - 1.03   pH 7.0 5.0 - 8.0   Glucose, UA NEGATIVE NEGATIVE   Bilirubin Urine NEGATIVE NEGATIVE   Ketones, ur 1+ (A) NEGATIVE   Hgb urine dipstick NEGATIVE NEGATIVE   Protein, ur NEGATIVE NEGATIVE   Nitrite NEGATIVE NEGATIVE   Leukocytes, UA NEGATIVE NEGATIVE  Pregnancy, urine     Status: None   Collection Time: 06/03/17  3:02 PM  Result Value Ref Range   Preg Test, Ur NEGATIVE NEGATIVE  GLUCOSE 16109     Status: Abnormal   Collection Time: 06/03/17  3:10 PM  Result Value Ref Range   Glucose 201 (H) 65 - 99 mg/dL      Assessment & Plan:      ICD-10-CM   1. Abdominal pain, unspecified abdominal location R10.9 Urinalysis, Routine w reflex microscopic    Pregnancy, urine    COMPLETE METABOLIC PANEL WITH GFR    Lipase    CBC with Differential/Platelet    DG Abd 1 View    omeprazole (PRILOSEC) 20 MG capsule  2. Gastroesophageal reflux disease, esophagitis presence not specified K21.9 omeprazole (PRILOSEC) 20 MG capsule  3. Type 2 diabetes mellitus with hyperglycemia, without long-term current use of insulin (  HCC) E11.65 Glucose, fingerstick (stat)    COMPLETE  METABOLIC PANEL WITH GFR    GLUCOSE [id number]    Obese 27 year old female presents with right upper quadrant abdominal pain that is colicky in nature has some constant dull pain but has been improving for 2 days, she also has GERD, dyspepsia, looser bowel movements, and has been out of her diabetes medicine for the past week and only restarted yesterday presents with fasting for the past day sugars are over 200.  She is well-appearing, mild tenderness on exam right upper quadrant but no concern for acute cholecystitis or acute abdomen.   Urine is positive for ketones which is consistent with anorexia for the past day.  CMP, lipase and CBC obtained to evaluate her abdominal pain and hyperglycemia.  Urinalysis was negative for infection, negative pregnancy test.  Will get KUB to evaluate for stool burden.  Differential wide, including GERD, peptic ulcers, gastric ulcers, cholelithiasis with biliary colic, GI virus, gastroparesis.  May also be due to something that she ate, her greasy and spicy fried food she has been eating.  Will treat reflux, obtain KUB.  Discussed food changes for GERD, patient encouraged to avoid spicy, citrusy, [place] fat or fried foods, elevate head of bed, stop eating several hours prior to bedtime, try over-the-counter medicines first and then if needed start omeprazole.    Encouraged to call us if she has any more colicky pain with eating, may need right upper quadrant ultrasound to evaluate however this time pain is improving, negative Murphy's.    Danelle Berry, PA-C 06/03/17 2:59 PM

## 2017-06-04 ENCOUNTER — Ambulatory Visit (HOSPITAL_COMMUNITY)
Admission: RE | Admit: 2017-06-04 | Discharge: 2017-06-04 | Disposition: A | Discharge status: Home / Self Care | Payer: BLUE CROSS/BLUE SHIELD | Source: Ambulatory Visit | Attending: Family Medicine | Admitting: Family Medicine

## 2017-06-04 DIAGNOSIS — R109 Unspecified abdominal pain: Secondary | ICD-10-CM | POA: Insufficient documentation

## 2017-06-04 LAB — CBC WITH DIFFERENTIAL/PLATELET
BASOS ABS: 31 {cells}/uL (ref 0–200)
Basophils Relative: 0.3 %
EOS ABS: 62 {cells}/uL (ref 15–500)
Eosinophils Relative: 0.6 %
HEMATOCRIT: 35.5 % (ref 35.0–45.0)
Hemoglobin: 11.8 g/dL (ref 11.7–15.5)
LYMPHS ABS: 2936 {cells}/uL (ref 850–3900)
MCH: 25.1 pg — AB (ref 27.0–33.0)
MCHC: 33.2 g/dL (ref 32.0–36.0)
MCV: 75.5 fL — AB (ref 80.0–100.0)
MPV: 10 fL (ref 7.5–12.5)
Monocytes Relative: 6.5 %
Neutro Abs: 6602 cells/uL (ref 1500–7800)
Neutrophils Relative %: 64.1 %
Platelets: 389 10*3/uL (ref 140–400)
RBC: 4.7 10*6/uL (ref 3.80–5.10)
RDW: 14.5 % (ref 11.0–15.0)
Total Lymphocyte: 28.5 %
WBC: 10.3 10*3/uL (ref 3.8–10.8)
WBCMIX: 670 {cells}/uL (ref 200–950)

## 2017-06-04 LAB — COMPLETE METABOLIC PANEL WITH GFR
AG RATIO: 1.2 (calc) (ref 1.0–2.5)
ALT: 9 U/L (ref 6–29)
AST: 12 U/L (ref 10–30)
Albumin: 3.7 g/dL (ref 3.6–5.1)
Alkaline phosphatase (APISO): 113 U/L (ref 33–115)
BUN: 8 mg/dL (ref 7–25)
CO2: 27 mmol/L (ref 20–32)
Calcium: 9.2 mg/dL (ref 8.6–10.2)
Chloride: 101 mmol/L (ref 98–110)
Creat: 0.58 mg/dL (ref 0.50–1.10)
GFR, EST AFRICAN AMERICAN: 150 mL/min/{1.73_m2} (ref >=60)
GFR, EST NON AFRICAN AMERICAN: 129 mL/min/{1.73_m2} (ref >=60)
GLOBULIN: 3.1 g/dL (ref 1.9–3.7)
Glucose, Bld: 190 mg/dL — ABNORMAL HIGH (ref 65–99)
POTASSIUM: 3.8 mmol/L (ref 3.5–5.3)
SODIUM: 137 mmol/L (ref 135–146)
Total Bilirubin: 0.5 mg/dL (ref 0.2–1.2)
Total Protein: 6.8 g/dL (ref 6.1–8.1)

## 2017-06-04 LAB — LIPASE: Lipase: 17 U/L (ref 7–60)

## 2017-06-07 NOTE — Progress Notes (Signed)
Lab work unremarkable - except for elevated blood sugar, which was known.  No concerning labs regarding abdomen.  She should continue the heart burn medicine.  If eating changes and meds don't improve in 1-2 weeks, she should come back.  Or if she has any severe bouts of pain we can recheck her and perhaps order the ultrasound.

## 2017-06-07 NOTE — Progress Notes (Signed)
Radiology report reviewed, images personally reviewed.  Agree with radiology report.  Pt will be called with negative imaging results.

## 2017-07-05 ENCOUNTER — Ambulatory Visit (INDEPENDENT_AMBULATORY_CARE_PROVIDER_SITE_OTHER): Payer: BLUE CROSS/BLUE SHIELD | Admitting: Family Medicine

## 2017-07-05 ENCOUNTER — Encounter: Payer: Self-pay | Admitting: Family Medicine

## 2017-07-05 ENCOUNTER — Other Ambulatory Visit: Payer: Self-pay | Admitting: Family Medicine

## 2017-07-05 ENCOUNTER — Other Ambulatory Visit: Payer: Self-pay

## 2017-07-05 VITALS — BP 128/78 | HR 80 | Temp 98.8°F | Resp 14 | Ht 70.0 in | Wt 252.0 lb

## 2017-07-05 DIAGNOSIS — Z0000 Encounter for general adult medical examination without abnormal findings: Secondary | ICD-10-CM

## 2017-07-05 DIAGNOSIS — Z6836 Body mass index (BMI) 36.0-36.9, adult: Secondary | ICD-10-CM | POA: Diagnosis not present

## 2017-07-05 DIAGNOSIS — K219 Gastro-esophageal reflux disease without esophagitis: Secondary | ICD-10-CM

## 2017-07-05 DIAGNOSIS — E119 Type 2 diabetes mellitus without complications: Secondary | ICD-10-CM | POA: Diagnosis not present

## 2017-07-05 DIAGNOSIS — R109 Unspecified abdominal pain: Secondary | ICD-10-CM

## 2017-07-05 DIAGNOSIS — Z23 Encounter for immunization: Secondary | ICD-10-CM

## 2017-07-05 LAB — HEMOGLOBIN A1C, FINGERSTICK: HEMOGLOBIN A1C, FINGERSTICK: 9.7 %{Hb} — AB (ref <6.0)

## 2017-07-05 MED ORDER — GLIPIZIDE 5 MG PO TABS: 5.0000 mg | ORAL_TABLET | Freq: Every day | ORAL | 3 refills | Status: DC

## 2017-07-05 NOTE — Assessment & Plan Note (Signed)
CPE done, TDAP given  Recent lipids at goal Continue to reiterate weight loss She does not have dental or eye insurance so can not get exams currently

## 2017-07-05 NOTE — Patient Instructions (Addendum)
F/U 3 months Samantha Lucas Your A1C is  9.7% Check your blood sugar regulary  Start glipizide with your largest meal once a day - start with 1/2 tablet first and see how you do

## 2017-07-05 NOTE — Progress Notes (Signed)
   Subjective:    Patient ID: Samantha Lucas, female    DOB: 01/11/1991, 27 y.o.   MRN: [id number]  Patient presents for CPE (is fasting)  Pt here for CPE   PAP Smear-  UTD Due for TDAP  Due for Eye exam- no insurance   DM- last A1C 10.2% in March, now on Victoza, has not checked blood sugars  Lipids at goal 3 months ago, LDL 89 Urine micro UTD   Had recent CBC/CMET due to     Review Of Systems:  GEN- denies fatigue, fever, weight loss,weakness, recent illness HEENT- denies eye drainage, change in vision, nasal discharge, CVS- denies chest pain, palpitations RESP- denies SOB, cough, wheeze ABD- denies N/V, change in stools, abd pain GU- denies dysuria, hematuria, dribbling, incontinence MSK- denies joint pain, muscle aches, injury Neuro- denies headache, dizziness, syncope, seizure activity       Objective:    BP 128/78   Pulse 80   Temp 98.8 F (37.1 C) (Oral)   Resp 14   Ht 5\' 10"  (1.778 m)   Wt 252 lb (114.3 kg)   LMP 06/20/2017 (Approximate) Comment: regualr  SpO2 98%   BMI 36.16 kg/m  GEN- NAD, alert and oriented x3 HEENT- PERRL, EOMI, non injected sclera, pink conjunctiva, MMM, oropharynx clear, TM clear bilat no effusion Neck- Supple, no thyromegaly CVS- RRR, no murmur RESP-CTAB ABD-NABS,soft,NT,ND EXT- No edema Pulses- Radial, DP- 2+        Assessment & Plan:      Problem List Items Addressed This Visit      Unprioritized   Diabetes mellitus, type II (HCC)   Relevant Medications   glipiZIDE (GLUCOTROL) 5 MG tablet   Other Relevant Orders   Hemoglobin A1C, fingerstick   Obesity   Relevant Medications   glipiZIDE (GLUCOTROL) 5 MG tablet   Routine general medical examination at a health care facility - Primary    CPE done, TDAP given  Recent lipids at goal Continue to reiterate weight loss She does not have dental or eye insurance so can not get exams currently        Relevant Orders   Tdap vaccine greater than or equal to 7yo IM  (Completed)    Other Visit Diagnoses    Need for tetanus, diphtheria, and acellular pertussis (Tdap) vaccine in patient of adolescent age or older       Relevant Orders   Tdap vaccine greater than or equal to 7yo IM (Completed)      Note: This dictation was prepared with Dragon dictation along with smaller Lobbyistphrase technology. Any transcriptional errors that result from this process are unintentional.

## 2017-08-16 ENCOUNTER — Other Ambulatory Visit: Payer: Self-pay | Admitting: Family Medicine

## 2017-10-05 ENCOUNTER — Encounter: Payer: Self-pay | Admitting: Family Medicine

## 2017-10-05 ENCOUNTER — Ambulatory Visit: Payer: BLUE CROSS/BLUE SHIELD | Admitting: Family Medicine

## 2017-10-05 ENCOUNTER — Other Ambulatory Visit: Payer: Self-pay

## 2017-10-05 VITALS — BP 120/72 | HR 70 | Temp 98.9°F | Resp 16 | Ht 70.0 in | Wt 248.0 lb

## 2017-10-05 DIAGNOSIS — E119 Type 2 diabetes mellitus without complications: Secondary | ICD-10-CM | POA: Diagnosis not present

## 2017-10-05 DIAGNOSIS — Z6835 Body mass index (BMI) 35.0-35.9, adult: Secondary | ICD-10-CM | POA: Diagnosis not present

## 2017-10-05 DIAGNOSIS — Z0000 Encounter for general adult medical examination without abnormal findings: Secondary | ICD-10-CM | POA: Diagnosis not present

## 2017-10-05 DIAGNOSIS — E1165 Type 2 diabetes mellitus with hyperglycemia: Secondary | ICD-10-CM | POA: Diagnosis not present

## 2017-10-05 DIAGNOSIS — Z23 Encounter for immunization: Secondary | ICD-10-CM

## 2017-10-05 NOTE — Assessment & Plan Note (Signed)
Weight and BMI decreased since last visit -4 lbs Add physical activity 3x a week for 30 min a day Dietary changes, add [place] protein snacks while at work, try to decrease fast food late at night after work.

## 2017-10-05 NOTE — Progress Notes (Signed)
Patient ID: Samantha Lucas, female    DOB: 05-09-1990, 27 y.o.   MRN: [id number]  PCP: Salley Scarlet, MD  Chief Complaint  Patient presents with  . Follow-up    DM- wants to change MTF d/t GI issues    Subjective:   Samantha Lucas is a 27 y.o. female, presents to clinic with CC of DM, uncontrolled with A1C 13.6 in January of this year, returns for f/up and is experiencing severe diarrhea from metformin dose.   Diabetes Mellitus Type II, Follow-up:   Lab Results  Component Value Date   HGBA1C 9.7 (H) 07/05/2017   HGBA1C 10.2 (H) 03/26/2017   HGBA1C 12.5 (H) 01/27/2017   Last seen for diabetes 3 months ago.  Management since then includes metformin, but not tolerating 2000 mg/d due to diarrhea,  5 mg glipizide, victoza 1.8 mg injection daily She reports fair compliance with treatment.  She has been breaking the metformin pills in half and is taking 500 mg 1 time a day to twice daily intermittently and some days skipping altogether, but she states she has been compliant with glipizide and Victoza daily. She is having side effects Current symptoms include none and one morning with fasting blood sugars elevated to about 300 she felt some tingling in her fingers.  Elevated sugars and sensation in her hands occurred after eating a lot of [employer] late at night after working.  It scared her and she began working on her diet little bit better than that she does not have any other episodes of sugar elevated to 300 and denies any other side effects of [place] or low blood sugar.  She denies any visual disturbances, number pain is in her lower extremities, agitation, confusion. Home blood sugar records: fasting some morning, have been [place] 200's and one occassion was 300 after working on her diet she notes that the best fasting sugar she has had have been about 140  Episodes of hypoglycemia? no  Current Insulin Regimen: None Most Recent Eye Exam: She is scheduled for eye exam in  October Weight trend: Lost 4 pounds Prior visit with dietician: yes  Current diet: in general, an "unhealthy" diet Current exercise: none  Pertinent Labs:    Component Value Date/Time   CHOL 150 03/26/2017 1143   TRIG 100 03/26/2017 1143   HDL 41 (L) 03/26/2017 1143   LDLCALC 89 03/26/2017 1143   CREATININE 0.58 06/03/2017 1518    Wt Readings from Last 3 Encounters:  10/05/17 248 lb (112.5 kg)  07/05/17 252 lb (114.3 kg)  06/03/17 256 lb 2 oz (116.2 kg)    Hypertension, follow-up:  BP Readings from Last 3 Encounters:  10/05/17 120/72  07/05/17 128/78  06/03/17 128/80    She was last seen for hypertension 3 months ago.  Pt states she doesn't have HTN, just on lisinopril to protect kidneys BP at that visit was 128/78. Management since that visit includes lisinopril 2.5 mg daily .She reports excellent compliance with treatment. She is not having side effects.  She is not exercising. She is not adherent to low salt diet.   Outside blood pressures are unmonitored. She is experiencing none.  Patient denies chest pain, chest pressure/discomfort, claudication, dyspnea, exertional chest pressure/discomfort, fatigue, irregular heart beat, lower extremity edema, near-syncope, orthopnea, palpitations, paroxysmal nocturnal dyspnea, syncope and tachypnea.   Cardiovascular risk factors include diabetes mellitus and obesity (BMI >= 30 kg/m2).  Use of agents associated with hypertension: none.    Lipid/Cholesterol,  Follow-up:    Last seen for this 6 months ago.  Management since that visit includes- diet for low HDL, but total cholesterol and LDL good.  Last Lipid Panel:    Component Value Date/Time   CHOL 150 03/26/2017 1143   TRIG 100 03/26/2017 1143   HDL 41 (L) 03/26/2017 1143   CHOLHDL 3.7 03/26/2017 1143   VLDL 18 10/04/2015 1125   LDLCALC 89 03/26/2017 1143    She reports poor compliance with treatment. She is not having side effects.   Wt Readings from Last 3  Encounters:  10/05/17 248 lb (112.5 kg)  07/05/17 252 lb (114.3 kg)  06/03/17 256 lb 2 oz (116.2 kg)   Pt due for flu vaccine - is available and pt agrees to get today.  Patient Active Problem List   Diagnosis Date Noted  . Routine general medical examination at a health care facility 11/14/2013  . Microcytic anemia 07/17/2013  . Candidiasis, intertrigo 06/24/2012  . Diabetes mellitus, type II (HCC) 06/24/2012  . Obesity 03/23/2011  . SCOLIOSIS-IDIOPATHIC 09/05/2007     Prior to Admission medications   Medication Sig Start Date End Date Taking? Authorizing Provider  calcium gluconate 500 MG tablet Take 1 tablet by mouth 3 (three) times daily.   Yes [provider]  ferrous sulfate 325 (65 FE) MG tablet Take 1 tablet (325 mg total) by mouth 3 (three) times daily with meals. 07/18/13  Yes [place], Velna Hatchet, MD  fluticasone [facility]) 50 MCG/ACT nasal spray Place 2 sprays into both nostrils daily. 12/24/14  Yes Allayne Butcher B, PA-C  glipiZIDE (GLUCOTROL) 5 MG tablet Take 1 tablet (5 mg total) by mouth daily before breakfast. 07/05/17  Yes [place], Velna Hatchet, MD  Glucose Blood (BLOOD GLUCOSE TEST STRIPS) STRP Use as directed to monitor FSBS 2x daily. Dx: E11.9. 12/24/16  Yes [place], Velna Hatchet, MD  Insulin Pen Needle 32G X 4 MM MISC Use as directed with Victoza. 02/22/17  Yes [place], Velna Hatchet, MD  liraglutide (VICTOZA) 18 MG/3ML SOPN Inject 0.3 mLs (1.8 mg total) into the skin daily. 04/01/17  Yes [place], Velna Hatchet, MD  lisinopril (PRINIVIL,ZESTRIL) 2.5 MG tablet TAKE 1 TABLET BY MOUTH EVERY DAY TO PROTECT KIDNEYS 08/16/17  Yes [place], Velna Hatchet, MD  Multiple Vitamin (MULITIVITAMIN WITH MINERALS) TABS Take 1 tablet by mouth daily.   Yes [provider]  omeprazole (PRILOSEC) 20 MG capsule TAKE 1 CAPSULE(20 MG) BY MOUTH DAILY 07/05/17  Yes Danelle Berry, PA-C  vitamin C (ASCORBIC ACID) 500 MG tablet Take 500 mg by mouth daily.   Yes [provider]  metFORMIN (GLUCOPHAGE)  1000 MG tablet TAKE 1 TABLET BY MOUTH TWICE DAILY WITH A MEAL Patient not taking: Reported on 10/05/2017 05/31/17   Salley Scarlet, MD   Family hx of HTN, diabetes stroke - reviewed today  Family History  Problem Relation Age of Onset  . Hypertension Mother   . Diabetes Mother   . Stroke Father   . Hypertension Father   . Cancer Paternal Grandmother        lung and breast cancer  . Hypertension Paternal Grandmother    Social Hx reviewed, no smoking, no ETOH     Review of Systems  Constitutional: Negative.  Negative for activity change, appetite change, fatigue and unexpected weight change.  HENT: Negative.   Eyes: Negative.   Respiratory: Negative.  Negative for shortness of breath.   Cardiovascular: Negative.  Negative for chest pain, palpitations and leg swelling.  Gastrointestinal: Positive for diarrhea. Negative for abdominal distention, abdominal pain, blood in stool and constipation.  Endocrine: Negative.  Negative for polydipsia, polyphagia and polyuria.  Genitourinary: Negative.   Musculoskeletal: Negative.  Negative for arthralgias, gait problem, joint swelling and myalgias.  Skin: Negative.  Negative for color change, pallor and rash.  Allergic/Immunologic: Negative.   Neurological: Negative.  Negative for syncope and weakness.  Hematological: Negative.   Psychiatric/Behavioral: Negative.  Negative for confusion, dysphoric mood, self-injury and suicidal ideas. The patient is not nervous/anxious.   All other systems reviewed and are negative.      Objective:    Vitals:   10/05/17 1136  BP: 120/72  Pulse: 70  Resp: 16  Temp: 98.9 F (37.2 C)  TempSrc: Oral  SpO2: 98%  Weight: 248 lb (112.5 kg)  Height: 5\' 10"  (1.778 m)      Physical Exam  Constitutional: She appears well-developed and well-nourished. No distress.  Well appearing obese young woman  HENT:  Head: Normocephalic and atraumatic.  Nose: Nose normal.  Eyes: Conjunctivae are normal. Right  eye exhibits no discharge. Left eye exhibits no discharge.  Neck: No tracheal deviation present.  Cardiovascular: Normal rate and regular rhythm.  Pulmonary/Chest: Effort normal. No stridor. No respiratory distress.  Musculoskeletal: Normal range of motion.  Neurological: She is alert. She exhibits normal muscle tone. Coordination normal.  Skin: Skin is warm and dry. No rash noted. She is not diaphoretic.  Psychiatric: She has a normal mood and affect. Her behavior is normal.  Nursing note and vitals reviewed.         Assessment & Plan:   Problem List Items Addressed This Visit      Endocrine   Diabetes mellitus, type II (HCC) - Primary    Pt not tolerating metformin, decrease dose to 500 mg BID daily for 2-3 w, then increase to 500 mg am and 1000 mg pm and notify us of SE/tolerance Continue glipizine and victoza CMP and A1C today Consider adding Trulicity - information reviewed with pt, will wait for A1C result      Relevant Orders   Hemoglobin A1c   COMPLETE METABOLIC PANEL WITH GFR     Other   Obesity    Weight and BMI decreased since last visit -4 lbs Add physical activity 3x a week for 30 min a day Dietary changes, add [place] protein snacks while at work, try to decrease fast food late at night after work.       Other Visit Diagnoses    Need for immunization against influenza       Relevant Orders   Flu Vaccine QUAD 36+ mos IM (Completed)       Danelle Berry, PA-C 10/05/17 12:45 PM

## 2017-10-05 NOTE — Patient Instructions (Signed)
Decrease metformin to 500 mg BID and after 2-3 weeks increase the nighttime dose to 1000 mg.  Hope you will tolerate this better.  Please let us know what dose you end up being able to manage without severe GI side effects.  I will call you with your results and blood tests and if you need an additional med, you can come schedule just a nurse visit and get the once weekly injection of trulicity.  Review information and let me know if you have any questions.  Continue to work on diet and exercise.  Take out one food your should avoid and add in one good food a week to help make changes you can sustain.    Monitor your sugars with fasting glucose levels and keep track - no eating for 8 hours before.  We can discuss endocrine referral at next appt if you're not making progress, but this year you have improved your sugars dramatically!  Keep up the good work!

## 2017-10-05 NOTE — Assessment & Plan Note (Signed)
Pt not tolerating metformin, decrease dose to 500 mg BID daily for 2-3 w, then increase to 500 mg am and 1000 mg pm and notify us of SE/tolerance Continue glipizine and victoza CMP and A1C today Consider adding Trulicity - information reviewed with pt, will wait for A1C result

## 2017-10-06 LAB — COMPLETE METABOLIC PANEL WITH GFR
AG Ratio: 1.2 (calc) (ref 1.0–2.5)
ALBUMIN MSPROF: 3.8 g/dL (ref 3.6–5.1)
ALT: 7 U/L (ref 6–29)
AST: 11 U/L (ref 10–30)
Alkaline phosphatase (APISO): 137 U/L — ABNORMAL HIGH (ref 33–115)
BUN: 8 mg/dL (ref 7–25)
CALCIUM: 9.5 mg/dL (ref 8.6–10.2)
CO2: 25 mmol/L (ref 20–32)
Chloride: 101 mmol/L (ref 98–110)
Creat: 0.66 mg/dL (ref 0.50–1.10)
GFR, EST AFRICAN AMERICAN: 143 mL/min/{1.73_m2} (ref >=60)
GFR, EST NON AFRICAN AMERICAN: 124 mL/min/{1.73_m2} (ref >=60)
GLOBULIN: 3.3 g/dL (ref 1.9–3.7)
Glucose, Bld: 111 mg/dL — ABNORMAL HIGH (ref 65–99)
POTASSIUM: 4.4 mmol/L (ref 3.5–5.3)
SODIUM: 136 mmol/L (ref 135–146)
TOTAL PROTEIN: 7.1 g/dL (ref 6.1–8.1)
Total Bilirubin: 0.3 mg/dL (ref 0.2–1.2)

## 2017-10-06 LAB — HEMOGLOBIN A1C
EAG (MMOL/L): 16.8 (calc)
Hgb A1c MFr Bld: 12.2 % of total Hgb — ABNORMAL HIGH (ref <5.7)
MEAN PLASMA GLUCOSE: 303 (calc)

## 2017-10-08 ENCOUNTER — Other Ambulatory Visit: Payer: Self-pay | Admitting: *Deleted

## 2017-10-08 MED ORDER — DULAGLUTIDE 0.75 MG/0.5ML ~~LOC~~ SOAJ: 0.7500 mg | SUBCUTANEOUS | 11 refills | Status: DC

## 2017-11-11 LAB — HM DIABETES EYE EXAM

## 2017-11-15 ENCOUNTER — Encounter: Payer: Self-pay | Admitting: *Deleted

## 2017-12-09 ENCOUNTER — Other Ambulatory Visit: Payer: Self-pay | Admitting: Family Medicine

## 2018-01-23 DIAGNOSIS — J069 Acute upper respiratory infection, unspecified: Secondary | ICD-10-CM | POA: Diagnosis not present

## 2018-03-02 DIAGNOSIS — J069 Acute upper respiratory infection, unspecified: Secondary | ICD-10-CM | POA: Diagnosis not present

## 2018-03-07 ENCOUNTER — Encounter: Payer: Self-pay | Admitting: Family Medicine

## 2018-03-07 ENCOUNTER — Ambulatory Visit (INDEPENDENT_AMBULATORY_CARE_PROVIDER_SITE_OTHER): Payer: BLUE CROSS/BLUE SHIELD | Admitting: Family Medicine

## 2018-03-07 ENCOUNTER — Other Ambulatory Visit: Payer: Self-pay

## 2018-03-07 ENCOUNTER — Ambulatory Visit: Payer: BLUE CROSS/BLUE SHIELD | Admitting: Family Medicine

## 2018-03-07 VITALS — BP 118/78 | HR 90 | Temp 98.7°F | Resp 16 | Ht 70.0 in | Wt 239.0 lb

## 2018-03-07 DIAGNOSIS — J0100 Acute maxillary sinusitis, unspecified: Secondary | ICD-10-CM

## 2018-03-07 DIAGNOSIS — E119 Type 2 diabetes mellitus without complications: Secondary | ICD-10-CM | POA: Diagnosis not present

## 2018-03-07 DIAGNOSIS — J209 Acute bronchitis, unspecified: Secondary | ICD-10-CM | POA: Diagnosis not present

## 2018-03-07 DIAGNOSIS — R6889 Other general symptoms and signs: Secondary | ICD-10-CM | POA: Diagnosis not present

## 2018-03-07 LAB — INFLUENZA A AND B AG, IMMUNOASSAY
INFLUENZA A ANTIGEN: NOT DETECTED
INFLUENZA B ANTIGEN: NOT DETECTED

## 2018-03-07 MED ORDER — HYDROCOD POLST-CPM POLST ER 10-8 MG/5ML PO SUER: 5.0000 mL | Freq: Two times a day (BID) | ORAL | 0 refills | Status: DC | PRN

## 2018-03-07 MED ORDER — AMOXICILLIN 875 MG PO TABS: 875.0000 mg | ORAL_TABLET | Freq: Two times a day (BID) | ORAL | 0 refills | Status: DC

## 2018-03-07 MED ORDER — PREDNISONE 20 MG PO TABS: 20.0000 mg | ORAL_TABLET | Freq: Every day | ORAL | 0 refills | Status: DC

## 2018-03-07 NOTE — Patient Instructions (Addendum)
Take the antibiotics Use tussionex Prednisone 20mg  x 5 days  Continue vitamin C  We will call with lab results F/U 4 months for Physical

## 2018-03-07 NOTE — Assessment & Plan Note (Signed)
Discussed importance of medication adherence Goal A1C , 7% with her age She has not been compliant with medications, but is working on dietary change and continues to lose weight

## 2018-03-07 NOTE — Progress Notes (Signed)
   Subjective:    Patient ID: Samantha Lucas, female    DOB: 08/27/1990, 28 y.o.   MRN: [id number]  Patient presents for Illness (x4 weeks- productive cough, chest congestion, nasal drainage, post nasal drip, L sided facial pain, HA- [facility],)   Pt here with 2nd illness over the past month or so, Send [facility] in [place] on 12/29,diagnosed with VIRAL URI, given tessalon and ipratriopium nasal spray. She had minimal improvement, returned back on 2.6 given promethazine DM, which helped with cough. Now has cough,keeping her her up at night, hoarse voice, sinus pressure and pain, headaches. No Varma fever  Mother and sister have also been sick  But she has been working at [employer]  No vomiting or diarrhea    DM- she off fried foods  and red meat for now , has not checked CBG recently  Missing vicotza, taking 500mg  twice a day most days Taking glipizide every morning  Last A1c 12.2% IN Sept   has lost 9lbs since sept  Review Of Systems:  GEN- denies fatigue, fever, weight loss,weakness, recent illness HEENT- denies eye drainage, change in vision, +nasal discharge, CVS- denies chest pain, palpitations RESP- denies SOB, +cough, wheeze ABD- denies N/V, change in stools, abd pain GU- denies dysuria, hematuria, dribbling, incontinence MSK- denies joint pain, muscle aches, injury Neuro- + headache,  Denies  dizziness, syncope, seizure activity       Objective:    BP 118/78   Pulse 90   Temp 98.7 F (37.1 C) (Oral)   Resp 16   Ht 5\' 10"  (1.778 m)   Wt 239 lb (108.4 kg)   SpO2 99%   BMI 34.29 kg/m  GEN- NAD, alert and oriented x3 HEENT- PERRL, EOMI, non injected sclera, pink conjunctiva, MMM, oropharynx mild injection, TM clear bilat no effusion, hoarse voice  + maxillary sinus tenderness, inflammed turbinates,  Nasal drainage  Neck- Supple, no LAD CVS- RRR, no murmur RESP-CTAB, bronchitic cough, no rales, no wheeze  EXT- No edema Pulses- Radial 2+         Assessment & Plan:      Problem List Items Addressed This Visit      Unprioritized   Diabetes mellitus, type II (HCC)    Discussed importance of medication adherence Goal A1C , 7% with her age She has not been compliant with medications, but is working on dietary change and continues to lose weight      Relevant Orders   CBC with Differential/Platelet   Comprehensive metabolic panel   Hemoglobin A1c    Other Visit Diagnoses    Acute non-recurrent maxillary sinusitis    -  Primary   treat with amoxicillin, nasal spray, change to tussionex for cough   Relevant Medications   promethazine-dextromethorphan (PROMETHAZINE-DM) 6.25-15 MG/5ML syrup   benzonatate (TESSALON) 200 MG capsule   mometasone (NASONEX) 50 MCG/ACT nasal spray   chlorpheniramine-HYDROcodone (TUSSIONEX PENNKINETIC ER) 10-8 MG/5ML SUER   amoxicillin (AMOXIL) 875 MG tablet   predniSONE (DELTASONE) 20 MG tablet   Other Relevant Orders   Influenza A and B Ag, Immunoassay (Completed)   Acute bronchitis, unspecified organism       low dose prednosone 20mg  daily for 5 days, due to diabetes, concerned still uncontrolled, missing meds       Note: This dictation was prepared with Dragon dictation along with smaller phrase technology. Any transcriptional errors that result from this process are unintentional.

## 2018-03-08 LAB — COMPREHENSIVE METABOLIC PANEL
AG Ratio: 1.1 (calc) (ref 1.0–2.5)
ALBUMIN MSPROF: 3.9 g/dL (ref 3.6–5.1)
ALT: 10 U/L (ref 6–29)
AST: 12 U/L (ref 10–30)
Alkaline phosphatase (APISO): 153 U/L — ABNORMAL HIGH (ref 31–125)
BUN: 9 mg/dL (ref 7–25)
CO2: 26 mmol/L (ref 20–32)
Calcium: 9.8 mg/dL (ref 8.6–10.2)
Chloride: 98 mmol/L (ref 98–110)
Creat: 0.59 mg/dL (ref 0.50–1.10)
Globulin: 3.7 g/dL (calc) (ref 1.9–3.7)
Glucose, Bld: 304 mg/dL — ABNORMAL HIGH (ref 65–99)
POTASSIUM: 4.8 mmol/L (ref 3.5–5.3)
Sodium: 135 mmol/L (ref 135–146)
Total Bilirubin: 0.5 mg/dL (ref 0.2–1.2)
Total Protein: 7.6 g/dL (ref 6.1–8.1)

## 2018-03-08 LAB — CBC WITH DIFFERENTIAL/PLATELET
Absolute Monocytes: 873 cells/uL (ref 200–950)
BASOS ABS: 49 {cells}/uL (ref 0–200)
Basophils Relative: 0.4 %
EOS PCT: 1.5 %
Eosinophils Absolute: 185 cells/uL (ref 15–500)
HCT: 40.6 % (ref 35.0–45.0)
Hemoglobin: 12.9 g/dL (ref 11.7–15.5)
Lymphs Abs: 3506 cells/uL (ref 850–3900)
MCH: 24.4 pg — ABNORMAL LOW (ref 27.0–33.0)
MCHC: 31.8 g/dL — AB (ref 32.0–36.0)
MCV: 76.9 fL — ABNORMAL LOW (ref 80.0–100.0)
MONOS PCT: 7.1 %
MPV: 10.5 fL (ref 7.5–12.5)
NEUTROS ABS: 7688 {cells}/uL (ref 1500–7800)
NEUTROS PCT: 62.5 %
PLATELETS: 402 10*3/uL — AB (ref 140–400)
RBC: 5.28 10*6/uL — ABNORMAL HIGH (ref 3.80–5.10)
RDW: 14.5 % (ref 11.0–15.0)
Total Lymphocyte: 28.5 %
WBC: 12.3 10*3/uL — ABNORMAL HIGH (ref 3.8–10.8)

## 2018-03-08 LAB — HEMOGLOBIN A1C
Hgb A1c MFr Bld: 13.4 % of total Hgb — ABNORMAL HIGH (ref <5.7)
Mean Plasma Glucose: 338 (calc)
eAG (mmol/L): 18.7 (calc)

## 2018-03-17 ENCOUNTER — Other Ambulatory Visit: Payer: Self-pay | Admitting: *Deleted

## 2018-03-17 MED ORDER — GLIPIZIDE 10 MG PO TABS: 10.0000 mg | ORAL_TABLET | Freq: Every day | ORAL | 6 refills | Status: DC

## 2018-04-29 ENCOUNTER — Telehealth: Payer: Self-pay | Admitting: Family Medicine

## 2018-04-29 NOTE — Telephone Encounter (Signed)
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

## 2018-05-02 ENCOUNTER — Other Ambulatory Visit: Payer: Self-pay

## 2018-05-02 ENCOUNTER — Ambulatory Visit (INDEPENDENT_AMBULATORY_CARE_PROVIDER_SITE_OTHER): Payer: BLUE CROSS/BLUE SHIELD | Admitting: Family Medicine

## 2018-05-02 ENCOUNTER — Encounter: Payer: Self-pay | Admitting: Family Medicine

## 2018-05-02 DIAGNOSIS — E119 Type 2 diabetes mellitus without complications: Secondary | ICD-10-CM

## 2018-05-02 NOTE — Telephone Encounter (Signed)
Call placed to patient. LM on VM to schedule Telelvisit with MD.

## 2018-05-02 NOTE — Progress Notes (Signed)
Virtual Visit via Telephone Note  I connected with Samantha Lucas on 05/02/18 at 10:45 by telephone and verified that I am speaking with the correct person using two identifiers.  Pt location: at home  Physician Location: in office, [facility], Milinda Antis MD   I discussed the limitations, risks, security and privacy concerns of performing an evaluation and management service by telephone and the availability of in person appointments. I also discussed with the patient that there may be a patient responsible charge related to this service. The patient expressed understanding and agreed to proceed.   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  She has concerned about going into the Covid-19 positive rooms as well as persons under interest.  She does have history of diabetes mellitus and has had some recurrent bronchitis she also has history of viral cardiomyopathy back in 2016.  She has been working and wants to continue to work but avoid these forms.  She has discussed this with her supervisors they want something in writing stating her Font[place] risk medical problems. No other specific concerns at this time     Observations/Objective: telephone  Assessment and Plan: Diabetes mellitus-[place] risk medical problems include diabetes mellitus history of viral cardiomyopathy as well as some recurrent episodes of bronchitis.  She works as a Water quality scientist.  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  In our conversation her supervisor is willing to allow her to continue working within the hospital but avoiding the [place] risk patients.  Follow Up Instructions:    I discussed the assessment and treatment plan with the patient. The patient was provided an opportunity to ask questions and all were answered. The patient agreed with the plan and demonstrated an understanding of the instructions.   The patient was advised to  call back or seek an in-person evaluation if the symptoms worsen or if the condition fails to improve as anticipated.  I provided 7  minutes of non-face-to-face time during this encounter.  End time 10:52am   Milinda Antis, MD

## 2018-05-02 NOTE — Telephone Encounter (Signed)
Patient returned call and made aware.   Agreeable to televisit. Appointment scheduled.  MD to be made aware.

## 2018-08-17 ENCOUNTER — Other Ambulatory Visit: Payer: Self-pay | Admitting: Family Medicine

## 2018-09-13 ENCOUNTER — Other Ambulatory Visit: Payer: Self-pay

## 2018-09-14 ENCOUNTER — Encounter: Payer: Self-pay | Admitting: Family Medicine

## 2018-09-14 ENCOUNTER — Ambulatory Visit (INDEPENDENT_AMBULATORY_CARE_PROVIDER_SITE_OTHER): Payer: BC Managed Care – PPO | Admitting: Family Medicine

## 2018-09-14 VITALS — BP 122/62 | HR 88 | Temp 98.1°F | Resp 16 | Ht 70.0 in | Wt 239.0 lb

## 2018-09-14 DIAGNOSIS — E119 Type 2 diabetes mellitus without complications: Secondary | ICD-10-CM | POA: Diagnosis not present

## 2018-09-14 DIAGNOSIS — Z23 Encounter for immunization: Secondary | ICD-10-CM

## 2018-09-14 DIAGNOSIS — L600 Ingrowing nail: Secondary | ICD-10-CM

## 2018-09-14 DIAGNOSIS — M722 Plantar fascial fibromatosis: Secondary | ICD-10-CM

## 2018-09-14 MED ORDER — DICLOFENAC SODIUM 75 MG PO TBEC: 75.0000 mg | DELAYED_RELEASE_TABLET | Freq: Two times a day (BID) | ORAL | 1 refills | Status: DC

## 2018-09-14 NOTE — Patient Instructions (Signed)
F/U  3 months 

## 2018-09-14 NOTE — Assessment & Plan Note (Signed)
Uncontrolled, next addition is insulin which she declines at this time I suspect A1C will still be significantly out of control She did not tolerate metformin, or Invokana/jardiance Continue ACEI

## 2018-09-14 NOTE — Progress Notes (Signed)
   Subjective:    Patient ID: Samantha Lucas, female    DOB: [birth year], 28 y.o.   MRN: [id number]  Patient presents for R Foot Pain (plantar fasicitis- arch to heel pain that is worse when first getting up) and Referral (requesting referral to podiatry- Felizardo Hoffmann)    Pain in right foot since Sunday, pain walking, difficulty bearing weight on foot, pain mid foot towards heel, but no real heel pain., Occ tingling in toes   She has been using ice and stretching to stretch  Taking tylenol, ibuprofen, took one hydrocodone   No particular injury    No ankle pain.  Would also like  Podiatry to look at recurrent ingrown toenails    A1C 13.6  Back in  Feb , 293 yesterday , she has been drinking Soda  Eating a lot of fast food, has  Not been checking her CBG   Taking Victoza 1.8mg   And glipizide    Review Of Systems:  GEN- denies fatigue, fever, weight loss,weakness, recent illness HEENT- denies eye drainage, change in vision, nasal discharge, CVS- denies chest pain, palpitations RESP- denies SOB, cough, wheeze ABD- denies N/V, change in stools, abd pain GU- denies dysuria, hematuria, dribbling, incontinence MSK-+ joint pain, muscle aches, injury Neuro- denies headache, dizziness, syncope, seizure activity       Objective:    BP 122/62   Pulse 88   Temp 98.1 F (36.7 C) (Oral)   Resp 16   Ht 5\' 10"  (1.778 m)   Wt 239 lb (108.4 kg)   SpO2 99%   BMI 34.29 kg/m  GEN- NAD, alert and oriented x3 CVS- RRR, no murmur RESP-CTAB ABD-NABS,soft,NT,ND EXT- Right foot, TTP at plantar insertion, no swelling, NT at heel, FROM Foot bilat,  Skin- mild bilat ingrown great toenails  Pulses- Radial, DP- 2+        Assessment & Plan:      Problem List Items Addressed This Visit      Unprioritized   Diabetes mellitus, type II ([place]) - Primary    Uncontrolled, next addition is insulin which she declines at this time I suspect A1C will still be significantly out of control She did  not tolerate metformin, or Invokana/jardiance Continue ACEI      Relevant Orders   Hemoglobin A1c   Comprehensive metabolic panel   HM DIABETES FOOT EXAM (Completed)    Other Visit Diagnoses    Plantar fasciitis of right foot       Start diclofenac BID, continue icing, stretching, podiatry referral   Relevant Orders   Ambulatory referral to Podiatry   Ingrown toenail of both feet       Relevant Orders   Ambulatory referral to Podiatry   Need for immunization against influenza       Relevant Orders   Flu Vaccine QUAD 36+ mos IM (Completed)      Note: This dictation was prepared with Dragon dictation along with smaller phrase technology. Any transcriptional errors that result from this process are unintentional.

## 2018-09-15 LAB — COMPREHENSIVE METABOLIC PANEL
AG Ratio: 1.1 (calc) (ref 1.0–2.5)
ALT: 8 U/L (ref 6–29)
AST: 9 U/L — ABNORMAL LOW (ref 10–30)
Albumin: 3.9 g/dL (ref 3.6–5.1)
Alkaline phosphatase (APISO): 142 U/L — ABNORMAL HIGH (ref 31–125)
BUN: 10 mg/dL (ref 7–25)
CO2: 24 mmol/L (ref 20–32)
Calcium: 9.5 mg/dL (ref 8.6–10.2)
Chloride: 100 mmol/L (ref 98–110)
Creat: 0.64 mg/dL (ref 0.50–1.10)
Globulin: 3.5 g/dL (calc) (ref 1.9–3.7)
Glucose, Bld: 323 mg/dL — ABNORMAL HIGH (ref 65–99)
Potassium: 4.8 mmol/L (ref 3.5–5.3)
Sodium: 134 mmol/L — ABNORMAL LOW (ref 135–146)
Total Bilirubin: 0.4 mg/dL (ref 0.2–1.2)
Total Protein: 7.4 g/dL (ref 6.1–8.1)

## 2018-09-15 LAB — HEMOGLOBIN A1C
Hgb A1c MFr Bld: 13.2 % of total Hgb — ABNORMAL HIGH (ref <5.7)
Mean Plasma Glucose: 332 (calc)
eAG (mmol/L): 18.4 (calc)

## 2018-09-16 ENCOUNTER — Other Ambulatory Visit: Payer: Self-pay | Admitting: *Deleted

## 2018-09-16 MED ORDER — LANTUS SOLOSTAR 100 UNIT/ML ~~LOC~~ SOPN: 10.0000 [IU] | PEN_INJECTOR | Freq: Every day | SUBCUTANEOUS | 11 refills | Status: DC

## 2018-10-14 ENCOUNTER — Other Ambulatory Visit: Payer: Self-pay

## 2018-10-17 ENCOUNTER — Other Ambulatory Visit: Payer: Self-pay

## 2018-10-17 ENCOUNTER — Encounter: Payer: Self-pay | Admitting: Family Medicine

## 2018-10-17 ENCOUNTER — Ambulatory Visit (INDEPENDENT_AMBULATORY_CARE_PROVIDER_SITE_OTHER): Payer: BC Managed Care – PPO | Admitting: Family Medicine

## 2018-10-17 VITALS — BP 120/66 | HR 84 | Temp 98.7°F | Resp 12 | Ht 70.0 in | Wt 245.0 lb

## 2018-10-17 DIAGNOSIS — Z6835 Body mass index (BMI) 35.0-35.9, adult: Secondary | ICD-10-CM

## 2018-10-17 DIAGNOSIS — E119 Type 2 diabetes mellitus without complications: Secondary | ICD-10-CM | POA: Diagnosis not present

## 2018-10-17 DIAGNOSIS — E66812 Obesity, class 2: Secondary | ICD-10-CM

## 2018-10-17 MED ORDER — LISINOPRIL 2.5 MG PO TABS: ORAL_TABLET | ORAL | 6 refills | Status: DC

## 2018-10-17 NOTE — Assessment & Plan Note (Signed)
Patient working on dietary changes her blood sugars He is now back on insulin therapy.  Goal is A1c less than 7%.  We will tweak her insulin based on her response.  I think if she is not eating so late at night her fasting blood sugars would not be as elevated a change in her work schedule will also help with this. Continue glipizide, Victoza and Lantus at this time we will check her renal function.  She is in a restart her lisinopril 2.5 mg tablet for renal protection  She is not on a statin drug at this time

## 2018-10-17 NOTE — Progress Notes (Signed)
   Subjective:    Patient ID: Samantha Lucas, female    DOB: [birth year], 28 y.o.   MRN: [id number]  Patient presents for Follow-up (is fasting)    Pt here to f/u diabetes mellitus her last A1c was greater than 13% a little over a month ago.  She was restarted on Lantus 10 units continue on Victoza 1.8 and glipizide 10 mg twice a day.  She is multiple other medications that she did not tolerate including metformin and Jardiance.   No polyuria, no polydipsia  She has cut out soda  Blood sugars at home are improved but still elevated her fastings have been 1 60-200 on average  Admits she needs to work on diet, eats late, works  2-10pm currently and often will eat between 11 PM and 12 AM which she knows is causing her sugars to go up and causing her to have difficulty losing weight.  She is planning to change her schedule in the next few weeks which she thinks will help. He has not had any hypoglycemia symptoms     UTD with eye doctor    Flu shot UTD   She has not been taking lisinopril 2.5mg    Review Of Systems:  GEN- denies fatigue, fever, weight loss,weakness, recent illness HEENT- denies eye drainage, change in vision, nasal discharge, CVS- denies chest pain, palpitations RESP- denies SOB, cough, wheeze ABD- denies N/V, change in stools, abd pain GU- denies dysuria, hematuria, dribbling, incontinence MSK- denies joint pain, muscle aches, injury Neuro- denies headache, dizziness, syncope, seizure activity       Objective:    BP 120/66   Pulse 84   Temp 98.7 F (37.1 C) (Oral)   Resp 12   Ht 5\' 10"  (1.778 m)   Wt 245 lb (111.1 kg)   SpO2 99%   BMI 35.15 kg/m  GEN- NAD, alert and oriented x3 HEENT- PERRL, EOMI, non injected sclera, pink conjunctiva, MMM, oropharynx clear CVS- RRR, no murmur RESP-CTAB ABD-NABS,soft,NT,ND EXT- No edema Pulses- Radial, DP- 2+        Assessment & Plan:      Problem List Items Addressed This Visit      Unprioritized   Diabetes  mellitus, type II ([place]) - Primary    Patient working on dietary changes her blood sugars He is now back on insulin therapy.  Goal is A1c less than 7%.  We will tweak her insulin based on her response.  I think if she is not eating so late at night her fasting blood sugars would not be as elevated a change in her work schedule will also help with this. Continue glipizide, Victoza and Lantus at this time we will check her renal function.  She is in a restart her lisinopril 2.5 mg tablet for renal protection  She is not on a statin drug at this time      Relevant Medications   lisinopril (ZESTRIL) 2.5 MG tablet   Other Relevant Orders   CBC with Differential/Platelet   Comprehensive metabolic panel   Lipid panel   Hemoglobin A1c   Microalbumin / creatinine urine ratio   Obesity      Note: This dictation was prepared with Dragon dictation along with smaller phrase technology. Any transcriptional errors that result from this process are unintentional.

## 2018-10-17 NOTE — Patient Instructions (Signed)
Restart lisinopril 2.5mg  once a day  F/U 3 months for Physical

## 2018-10-18 LAB — COMPREHENSIVE METABOLIC PANEL
AG Ratio: 1.1 (calc) (ref 1.0–2.5)
ALT: 9 U/L (ref 6–29)
AST: 10 U/L (ref 10–30)
Albumin: 3.7 g/dL (ref 3.6–5.1)
Alkaline phosphatase (APISO): 120 U/L (ref 31–125)
BUN: 12 mg/dL (ref 7–25)
CO2: 26 mmol/L (ref 20–32)
Calcium: 9.1 mg/dL (ref 8.6–10.2)
Chloride: 102 mmol/L (ref 98–110)
Creat: 0.6 mg/dL (ref 0.50–1.10)
Globulin: 3.3 g/dL (calc) (ref 1.9–3.7)
Glucose, Bld: 182 mg/dL — ABNORMAL HIGH (ref 65–99)
Potassium: 4.6 mmol/L (ref 3.5–5.3)
Sodium: 137 mmol/L (ref 135–146)
Total Bilirubin: 0.5 mg/dL (ref 0.2–1.2)
Total Protein: 7 g/dL (ref 6.1–8.1)

## 2018-10-18 LAB — MICROALBUMIN / CREATININE URINE RATIO
Creatinine, Urine: 142 mg/dL (ref 20–275)
Microalb Creat Ratio: 4 mcg/mg creat (ref <30)
Microalb, Ur: 0.5 mg/dL

## 2018-10-18 LAB — CBC WITH DIFFERENTIAL/PLATELET
Absolute Monocytes: 770 cells/uL (ref 200–950)
Basophils Absolute: 52 cells/uL (ref 0–200)
Basophils Relative: 0.5 %
Eosinophils Absolute: 187 cells/uL (ref 15–500)
Eosinophils Relative: 1.8 %
HCT: 38.4 % (ref 35.0–45.0)
Hemoglobin: 12.4 g/dL (ref 11.7–15.5)
Lymphs Abs: 3255 cells/uL (ref 850–3900)
MCH: 25.2 pg — ABNORMAL LOW (ref 27.0–33.0)
MCHC: 32.3 g/dL (ref 32.0–36.0)
MCV: 78 fL — ABNORMAL LOW (ref 80.0–100.0)
MPV: 10.3 fL (ref 7.5–12.5)
Monocytes Relative: 7.4 %
Neutro Abs: 6136 cells/uL (ref 1500–7800)
Neutrophils Relative %: 59 %
Platelets: 399 10*3/uL (ref 140–400)
RBC: 4.92 10*6/uL (ref 3.80–5.10)
RDW: 14.9 % (ref 11.0–15.0)
Total Lymphocyte: 31.3 %
WBC: 10.4 10*3/uL (ref 3.8–10.8)

## 2018-10-18 LAB — LIPID PANEL
Cholesterol: 173 mg/dL (ref <200)
HDL: 38 mg/dL — ABNORMAL LOW (ref >=50)
LDL Cholesterol (Calc): 118 mg/dL (calc) — ABNORMAL HIGH
Non-HDL Cholesterol (Calc): 135 mg/dL (calc) — ABNORMAL HIGH (ref <130)
Total CHOL/HDL Ratio: 4.6 (calc) (ref <5.0)
Triglycerides: 78 mg/dL (ref <150)

## 2018-10-18 LAB — HEMOGLOBIN A1C
Hgb A1c MFr Bld: 11.1 % of total Hgb — ABNORMAL HIGH (ref <5.7)
Mean Plasma Glucose: 272 (calc)
eAG (mmol/L): 15.1 (calc)

## 2018-10-20 ENCOUNTER — Other Ambulatory Visit: Payer: Self-pay | Admitting: *Deleted

## 2018-10-20 MED ORDER — LANTUS SOLOSTAR 100 UNIT/ML ~~LOC~~ SOPN: 15.0000 [IU] | PEN_INJECTOR | Freq: Every day | SUBCUTANEOUS | 11 refills | Status: DC

## 2018-12-09 ENCOUNTER — Other Ambulatory Visit: Payer: Self-pay | Admitting: *Deleted

## 2018-12-09 MED ORDER — DICLOFENAC SODIUM 75 MG PO TBEC: 75.0000 mg | DELAYED_RELEASE_TABLET | Freq: Two times a day (BID) | ORAL | 1 refills | Status: DC

## 2019-01-05 ENCOUNTER — Other Ambulatory Visit: Payer: Self-pay | Admitting: *Deleted

## 2019-01-05 MED ORDER — GLIPIZIDE 10 MG PO TABS: 10.0000 mg | ORAL_TABLET | Freq: Every day | ORAL | 6 refills | Status: DC

## 2019-02-06 ENCOUNTER — Encounter: Payer: BC Managed Care – PPO | Admitting: Family Medicine

## 2019-05-05 ENCOUNTER — Encounter: Payer: BC Managed Care – PPO | Admitting: Family Medicine

## 2019-05-12 ENCOUNTER — Other Ambulatory Visit: Payer: Self-pay

## 2019-05-12 ENCOUNTER — Ambulatory Visit: Payer: 59 | Admitting: Podiatry

## 2019-05-12 ENCOUNTER — Ambulatory Visit (INDEPENDENT_AMBULATORY_CARE_PROVIDER_SITE_OTHER): Payer: No Typology Code available for payment source

## 2019-05-12 ENCOUNTER — Other Ambulatory Visit: Payer: Self-pay | Admitting: Podiatry

## 2019-05-12 DIAGNOSIS — M2142 Flat foot [pes planus] (acquired), left foot: Secondary | ICD-10-CM

## 2019-05-12 DIAGNOSIS — M79672 Pain in left foot: Secondary | ICD-10-CM

## 2019-05-12 DIAGNOSIS — M216X9 Other acquired deformities of unspecified foot: Secondary | ICD-10-CM | POA: Diagnosis not present

## 2019-05-12 DIAGNOSIS — M79671 Pain in right foot: Secondary | ICD-10-CM

## 2019-05-12 DIAGNOSIS — M722 Plantar fascial fibromatosis: Secondary | ICD-10-CM

## 2019-06-06 ENCOUNTER — Other Ambulatory Visit: Payer: Self-pay | Admitting: Family Medicine

## 2019-06-06 ENCOUNTER — Other Ambulatory Visit: Payer: Self-pay

## 2019-06-06 ENCOUNTER — Ambulatory Visit (INDEPENDENT_AMBULATORY_CARE_PROVIDER_SITE_OTHER): Payer: No Typology Code available for payment source | Admitting: Family Medicine

## 2019-06-06 ENCOUNTER — Encounter: Payer: Self-pay | Admitting: Family Medicine

## 2019-06-06 VITALS — BP 124/80 | HR 80 | Temp 98.1°F | Resp 18 | Ht 70.0 in | Wt 254.4 lb

## 2019-06-06 DIAGNOSIS — E669 Obesity, unspecified: Secondary | ICD-10-CM | POA: Diagnosis not present

## 2019-06-06 DIAGNOSIS — Z0000 Encounter for general adult medical examination without abnormal findings: Secondary | ICD-10-CM

## 2019-06-06 DIAGNOSIS — Z0001 Encounter for general adult medical examination with abnormal findings: Secondary | ICD-10-CM | POA: Diagnosis not present

## 2019-06-06 DIAGNOSIS — E66812 Obesity, class 2: Secondary | ICD-10-CM

## 2019-06-06 DIAGNOSIS — E1165 Type 2 diabetes mellitus with hyperglycemia: Secondary | ICD-10-CM | POA: Diagnosis not present

## 2019-06-06 DIAGNOSIS — Z124 Encounter for screening for malignant neoplasm of cervix: Secondary | ICD-10-CM

## 2019-06-06 DIAGNOSIS — N912 Amenorrhea, unspecified: Secondary | ICD-10-CM

## 2019-06-06 DIAGNOSIS — Z794 Long term (current) use of insulin: Secondary | ICD-10-CM

## 2019-06-06 MED ORDER — TRULICITY 0.75 MG/0.5ML ~~LOC~~ SOAJ: 0.7500 mg | SUBCUTANEOUS | 1 refills | Status: DC

## 2019-06-06 MED ORDER — BASAGLAR KWIKPEN 100 UNIT/ML ~~LOC~~ SOPN: PEN_INJECTOR | SUBCUTANEOUS | 3 refills | Status: DC

## 2019-06-06 MED ORDER — LISINOPRIL 2.5 MG PO TABS: ORAL_TABLET | ORAL | 6 refills | Status: DC

## 2019-06-06 MED ORDER — GLIPIZIDE 5 MG PO TABS: 5.0000 mg | ORAL_TABLET | Freq: Two times a day (BID) | ORAL | 3 refills | Status: DC

## 2019-06-06 NOTE — Patient Instructions (Addendum)
Start trulicity once a week at  0.75mg  Start basaglar at 10units at bedtime Restart lisinopril 2.5mg  once a day  Do not take the glipizide right now  F/U 4 weeks with readings

## 2019-06-06 NOTE — Assessment & Plan Note (Signed)
Referral to healthy weight and wellness Per above

## 2019-06-06 NOTE — Progress Notes (Signed)
Subjective:    Patient ID: Samantha Lucas, female    DOB: [birth year], 29 y.o.   MRN: [id number]  Patient presents for Annual Exam   Pt here for CPE , medications and history reviewed   She has not had any medications for the past 3 weeks or more Due to insurance change , last on lantus  15 units, glipizide 10mg  once a day and victoza    DM- CBG this AM was  300's fasting  11.1% in Sept    Missed menses last month - March 22nd , she is not sexually active, no vaginal discharge,    She is interested in a weight loss program- She has been taking Apple Cider vinegar, womens hormone vitamins with black cohosh and a relaxation vitamin and mulitivitamin , she has lost weight in past on low carb diet but regained   She eats a lot when she comes home WORKS 12-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 and then goes to sleep, no physical activity besides walking at work   she typically eats 3 times a day and a snack ,  Due for PAP Smear, last in 2017   Eye exam scheduled in 2 weeks  Has seen dentist    Immunizations- COVID   Review Of Systems:  GEN- denies fatigue, fever, weight loss,weakness, recent illness HEENT- denies eye drainage, change in vision, nasal discharge, CVS- denies chest pain, palpitations RESP- denies SOB, cough, wheeze ABD- denies N/V, change in stools, abd pain GU- denies dysuria, hematuria, dribbling, incontinence MSK- denies joint pain, muscle aches, injury Neuro- denies headache, dizziness, syncope, seizure activity       Objective:    BP 124/80 (BP Location: Right Arm, Patient Position: Sitting, Cuff Size: Large)   Pulse 80   Temp 98.1 F (36.7 C) (Temporal)   Resp 18   Ht 5\' 10"  (1.778 m)   Wt 254 lb 6.4 oz (115.4 kg)   SpO2 100%   BMI 36.50 kg/m  GEN- NAD, alert and oriented x3 HEENT- PERRL, EOMI, non injected sclera, pink conjunctiva, MMM, oropharynx clear Neck- Supple, no thyromegaly CVS- RRR, no murmur Breast- normal symmetry, no nipple inversion,no nipple drainage, no nodules  or lumps felt Nodes- no axillary nodes RESP-CTAB ABD-NABS,soft,NT,ND GU- normal external genitalia, vaginal mucosa pink and moist, cervix visualized no growth, no blood form os, + white discharge, no CMT, no ovarian masses, uterus normal size Psych- normal affect and mood  EXT- No edema Pulses- Radial, DP- 2+   PHQ 9/fall/ CAGE negative      Assessment & Plan:      Problem List Items Addressed This Visit      Unprioritized   Class 2 obesity    Referral to healthy weight and wellness Per above      Relevant Orders   Amb Ref to Medical Weight Management   Routine general medical examination at a health care facility - Primary   Relevant Orders   CBC with Differential/Platelet   Comprehensive metabolic panel   TSH   Type 2 diabetes mellitus with hyperglycemia ([place])    Uncontrolled DM Pt does not tolerate SGLT-2 or metformin, has tried multiple in the past  CBG uncontrolled with elevated A1C Goal is to work on healthier eating and come off insulin which she has done in the past   Due to insurance change Start basalgar 10 units at bedtime Trulicity 0.75mg  once a week  Hold glipizide for now  Low dose ACEI for renal protection  Recheck in 4 weeks  Relevant Medications   glipiZIDE (GLUCOTROL) 5 MG tablet   lisinopril (ZESTRIL) 2.5 MG tablet   Dulaglutide (TRULICITY) 0.75 MG/0.5ML SOPN   Insulin Glargine (BASAGLAR KWIKPEN) 100 UNIT/ML   Other Relevant Orders   Hemoglobin A1c   Lipid panel   Microalbumin / creatinine urine ratio   HM DIABETES FOOT EXAM (Completed)   Amb Ref to Medical Weight Management    Other Visit Diagnoses    Cervical cancer screening       Relevant Orders   Pap IG w/ reflex to HPV when ASC-U   Amenorrhea       I think missed cycle due to stress, will check FSH/LH, stop menopausal vitamins, take MVI once a day, okay to take the apple cider if she wishes   Relevant Orders   FSH/LH      Note: This dictation was prepared with Dragon  dictation along with smaller phrase technology. Any transcriptional errors that result from this process are unintentional.

## 2019-06-06 NOTE — Assessment & Plan Note (Addendum)
Uncontrolled DM Pt does not tolerate SGLT-2 or metformin, has tried multiple in the past  CBG uncontrolled with elevated A1C Goal is to work on healthier eating and come off insulin which she has done in the past   Due to insurance change Start basalgar 10 units at bedtime Trulicity 0.75mg  once a week  Hold glipizide for now  Low dose ACEI for renal protection  Recheck in 4 weeks

## 2019-06-07 LAB — FSH/LH
FSH: 3.9 m[IU]/mL
LH: 2.9 m[IU]/mL

## 2019-06-08 LAB — PAP IG W/ RFLX HPV ASCU

## 2019-06-09 ENCOUNTER — Other Ambulatory Visit: Payer: Self-pay | Admitting: *Deleted

## 2019-06-09 DIAGNOSIS — R7989 Other specified abnormal findings of blood chemistry: Secondary | ICD-10-CM

## 2019-06-09 LAB — MICROALBUMIN / CREATININE URINE RATIO
Creatinine, Urine: 76 mg/dL (ref 20–275)
Microalb, Ur: <0.2 mg/dL

## 2019-06-09 LAB — LIPID PANEL
Cholesterol: 172 mg/dL (ref <200)
HDL: 37 mg/dL — ABNORMAL LOW (ref >=50)
LDL Cholesterol (Calc): 109 mg/dL (calc) — ABNORMAL HIGH
Non-HDL Cholesterol (Calc): 135 mg/dL (calc) — ABNORMAL HIGH (ref <130)
Total CHOL/HDL Ratio: 4.6 (calc) (ref <5.0)
Triglycerides: 145 mg/dL (ref <150)

## 2019-06-09 LAB — HEMOGLOBIN A1C
Hgb A1c MFr Bld: 12.6 % of total Hgb — ABNORMAL HIGH (ref <5.7)
Mean Plasma Glucose: 315 (calc)
eAG (mmol/L): 17.4 (calc)

## 2019-06-09 LAB — COMPREHENSIVE METABOLIC PANEL
AG Ratio: 1.1 (calc) (ref 1.0–2.5)
ALT: 9 U/L (ref 6–29)
AST: 11 U/L (ref 10–30)
Albumin: 3.7 g/dL (ref 3.6–5.1)
Alkaline phosphatase (APISO): 132 U/L — ABNORMAL HIGH (ref 31–125)
BUN: 10 mg/dL (ref 7–25)
CO2: 23 mmol/L (ref 20–32)
Calcium: 8.8 mg/dL (ref 8.6–10.2)
Chloride: 97 mmol/L — ABNORMAL LOW (ref 98–110)
Creat: 0.64 mg/dL (ref 0.50–1.10)
Globulin: 3.5 g/dL (calc) (ref 1.9–3.7)
Glucose, Bld: 329 mg/dL — ABNORMAL HIGH (ref 65–99)
Potassium: 4.5 mmol/L (ref 3.5–5.3)
Sodium: 131 mmol/L — ABNORMAL LOW (ref 135–146)
Total Bilirubin: 0.5 mg/dL (ref 0.2–1.2)
Total Protein: 7.2 g/dL (ref 6.1–8.1)

## 2019-06-09 LAB — CBC WITH DIFFERENTIAL/PLATELET
Absolute Monocytes: 740 cells/uL (ref 200–950)
Basophils Absolute: 40 cells/uL (ref 0–200)
Basophils Relative: 0.4 %
Eosinophils Absolute: 160 cells/uL (ref 15–500)
Eosinophils Relative: 1.6 %
HCT: 42.2 % (ref 35.0–45.0)
Hemoglobin: 13 g/dL (ref 11.7–15.5)
Lymphs Abs: 3470 cells/uL (ref 850–3900)
MCH: 25.1 pg — ABNORMAL LOW (ref 27.0–33.0)
MCHC: 30.8 g/dL — ABNORMAL LOW (ref 32.0–36.0)
MCV: 81.5 fL (ref 80.0–100.0)
MPV: 10.5 fL (ref 7.5–12.5)
Monocytes Relative: 7.4 %
Neutro Abs: 5590 cells/uL (ref 1500–7800)
Neutrophils Relative %: 55.9 %
Platelets: 336 10*3/uL (ref 140–400)
RBC: 5.18 10*6/uL — ABNORMAL HIGH (ref 3.80–5.10)
RDW: 13.4 % (ref 11.0–15.0)
Total Lymphocyte: 34.7 %
WBC: 10 10*3/uL (ref 3.8–10.8)

## 2019-06-09 LAB — TSH: TSH: 4.96 mIU/L — ABNORMAL HIGH

## 2019-06-09 LAB — TEST AUTHORIZATION

## 2019-06-09 LAB — T3, FREE: T3, Free: 3.6 pg/mL (ref 2.3–4.2)

## 2019-06-09 LAB — T4, FREE: Free T4: 1.4 ng/dL (ref 0.8–1.8)

## 2019-06-14 ENCOUNTER — Other Ambulatory Visit: Payer: Self-pay

## 2019-06-14 ENCOUNTER — Encounter: Payer: 59 | Admitting: Orthotics

## 2019-07-04 ENCOUNTER — Ambulatory Visit: Payer: No Typology Code available for payment source | Admitting: Family Medicine

## 2019-07-04 ENCOUNTER — Encounter: Payer: Self-pay | Admitting: Family Medicine

## 2019-07-04 ENCOUNTER — Other Ambulatory Visit: Payer: Self-pay

## 2019-07-04 DIAGNOSIS — E1165 Type 2 diabetes mellitus with hyperglycemia: Secondary | ICD-10-CM | POA: Diagnosis not present

## 2019-07-04 DIAGNOSIS — Z794 Long term (current) use of insulin: Secondary | ICD-10-CM | POA: Diagnosis not present

## 2019-07-04 MED ORDER — TRULICITY 1.5 MG/0.5ML ~~LOC~~ SOAJ
1.5000 mg | SUBCUTANEOUS | 2 refills | Status: DC
Start: 2019-07-04 — End: 2019-09-27

## 2019-07-04 NOTE — Patient Instructions (Addendum)
Take 1 glipizide 5mg  with dinner  Trulcity increase to  1.5mg  on next prescription Keep insulin- Basagalr at 10 units  F/U July for labs  F/U 3 Months

## 2019-07-04 NOTE — Assessment & Plan Note (Signed)
Uncontrolled DM Will increase trulicity to 1.5mg  once a week She has 2 pens left of the lower dose, then will increase Continue basaglar at 10 units Add glipizide 5mg  with dinner Pt does not tolerate MTF Goal is to hopefully get off insulin first and keep GLP-1 therapy Discussed nutrition, will start with meal planning for dinner, often eats out, [place] carb meals  Continue ACEI Return in July for recheck on thyroid levels Pt to call healthy weight and wellness center

## 2019-07-04 NOTE — Progress Notes (Signed)
   Subjective:    Patient ID: Samantha Lucas, female    DOB: [birth year], 29 y.o.   MRN: [id number]  Patient presents for Follow-up (is fasting)  Pt here for intermin f/u on medications   Fasting this AM 327, last cole slaw and salmon Her last A1C 12.6%  fasting CBG have been 179-280's reviewed meter, though she has not been very consistent with checking    Taking trulicity every wed, for the past 3 weeks , no SE with meds    Basaglar-  10 units at bedtime    She is now back on lisinopril 2.5    Weight down 4lbs in the past few weeks    covid-19 VACCINE UTD    Review Of Systems:  GEN- denies fatigue, fever, weight loss,weakness, recent illness HEENT- denies eye drainage, change in vision, nasal discharge, CVS- denies chest pain, palpitations RESP- denies SOB, cough, wheeze ABD- denies N/V, change in stools, abd pain GU- denies dysuria, hematuria, dribbling, incontinence MSK- denies joint pain, muscle aches, injury Neuro- denies headache, dizziness, syncope, seizure activity       Objective:    BP 118/64   Pulse 94   Temp 98.1 F (36.7 C) (Temporal)   Resp 14   Ht 5\' 10"  (1.778 m)   Wt 251 lb (113.9 kg)   SpO2 99%   BMI 36.01 kg/m  GEN- NAD, alert and oriented x3         Assessment & Plan:      Problem List Items Addressed This Visit      Unprioritized   Type 2 diabetes mellitus with hyperglycemia (HCC)    Uncontrolled DM Will increase trulicity to 1.5mg  once a week She has 2 pens left of the lower dose, then will increase Continue basaglar at 10 units Add glipizide 5mg  with dinner Pt does not tolerate MTF Goal is to hopefully get off insulin first and keep GLP-1 therapy Discussed nutrition, will start with meal planning for dinner, often eats out, [place] carb meals  Continue ACEI Return in July for recheck on thyroid levels Pt to call healthy weight and wellness center       Relevant Medications   Dulaglutide (TRULICITY) 1.5 MG/0.5ML SOPN      Note: This dictation was prepared with Dragon dictation along with smaller phrase technology. Any transcriptional errors that result from this process are unintentional.

## 2019-07-10 NOTE — Progress Notes (Signed)
  Subjective:  Patient ID: Samantha Lucas, female    DOB: 28-Feb-1990,  MRN: [id number]  Chief Complaint  Patient presents with  . Nail Problem    Nail discoloration 1-5 bilateral 1 yr duration, right 1st toenail history of injury.  . Foot Pain    Left plantar midfoot and heel pain, 1 year duration, no known injuries.    29 y.o. female presents with the above complaint. History confirmed with patient.   Objective:  Physical Exam: warm, good capillary refill, no trophic changes or ulcerative lesions, normal DP and PT pulses and normal sensory exam. Left Foot: tenderness to palpation medial calcaneal tuber, no pain with calcaneal squeeze, decreased ankle joint ROM and +Silverskiold test Right Foot: tenderness to palpation medial calcaneal tuber, no pain with calcaneal squeeze, decreased ankle joint ROM and +Silverskiold test normal exam, no swelling, tenderness, instability; ligaments intact, full range of motion of all ankle/foot joints other than findings noted above.  Radiographs: X-ray of the left foot: no evidence of calcaneal stress fracture, plantar calcaneal spur, posterior calcaneal spur and Haglund deformity noted  Assessment:   1. Plantar fasciitis   2. Equinus deformity of foot   3. Pain of both heels      Plan:  Patient was evaluated and treated and all questions answered.  Plantar Fasciitis -XR reviewed with patient -Educated patient on stretching and icing of the affected limb -Plantar fascial brace dispensed  x2 -Casted for CMOs  Return in about 6 weeks (around 06/23/2019) for Plantar fasciitis.

## 2019-07-18 ENCOUNTER — Ambulatory Visit (INDEPENDENT_AMBULATORY_CARE_PROVIDER_SITE_OTHER): Payer: No Typology Code available for payment source | Admitting: Family Medicine

## 2019-07-18 ENCOUNTER — Encounter (INDEPENDENT_AMBULATORY_CARE_PROVIDER_SITE_OTHER): Payer: Self-pay | Admitting: Family Medicine

## 2019-07-18 ENCOUNTER — Other Ambulatory Visit: Payer: Self-pay

## 2019-07-18 VITALS — BP 110/72 | HR 80 | Temp 98.6°F | Ht 69.0 in | Wt 250.0 lb

## 2019-07-18 DIAGNOSIS — R0602 Shortness of breath: Secondary | ICD-10-CM | POA: Diagnosis not present

## 2019-07-18 DIAGNOSIS — F3289 Other specified depressive episodes: Secondary | ICD-10-CM

## 2019-07-18 DIAGNOSIS — R5383 Other fatigue: Secondary | ICD-10-CM | POA: Diagnosis not present

## 2019-07-18 DIAGNOSIS — Z0289 Encounter for other administrative examinations: Secondary | ICD-10-CM

## 2019-07-18 DIAGNOSIS — Z6837 Body mass index (BMI) 37.0-37.9, adult: Secondary | ICD-10-CM

## 2019-07-18 DIAGNOSIS — E1169 Type 2 diabetes mellitus with other specified complication: Secondary | ICD-10-CM

## 2019-07-18 DIAGNOSIS — E785 Hyperlipidemia, unspecified: Secondary | ICD-10-CM

## 2019-07-18 DIAGNOSIS — R7989 Other specified abnormal findings of blood chemistry: Secondary | ICD-10-CM | POA: Diagnosis not present

## 2019-07-18 NOTE — Progress Notes (Signed)
Dear Dr. Jeanice Lim,   Thank you for referring Samantha Lucas to our clinic. The following note includes my evaluation and treatment recommendations.  Chief Complaint:   OBESITY Samantha Lucas (MR# [id number]) is a 29 y.o. female who presents for evaluation and treatment of obesity and related comorbidities. Current BMI is Body mass index is 36.92 kg/m. Samantha Lucas has been struggling with her weight for many years and has been unsuccessful in either losing weight, maintaining weight loss, or reaching her healthy weight goal.  Samantha Lucas is currently in the action stage of change and ready to dedicate time achieving and maintaining a healthier weight. Samantha Lucas is interested in becoming our patient and working on intensive lifestyle modifications including (but not limited to) diet and exercise for weight loss.  Samantha Lucas's habits were reviewed today and are as follows: Her family eats meals together, she thinks her family will eat healthier with her, her desired weight loss is 56 pounds, she has been heavy most of her life, her heaviest weight ever was 273 pounds, she craves pasta, fast food, and bread, she is frequently drinking liquids with calories, she frequently makes poor food choices, she frequently eats larger portions than normal and she struggles with emotional eating.  Depression Screen Samantha Lucas's Food and Mood (modified PHQ-9) score was 5.  Depression screen Samantha Lucas 2/9 07/18/2019  Decreased Interest 1  Down, Depressed, Hopeless 0  PHQ - 2 Score 1  Altered sleeping 0  Tired, decreased energy 2  Change in appetite 1  Feeling bad or failure about yourself  0  Trouble concentrating 1  Moving slowly or fidgety/restless 0  Suicidal thoughts 0  PHQ-9 Score 5  Difficult doing work/chores Not difficult at all   Subjective:   1. Other fatigue Samantha Lucas denies daytime somnolence and denies waking up still tired. Patent has a history of symptoms of snoring. Samantha Lucas generally gets 6 hours of sleep per night, and  states that she has generally restful sleep. Snoring is present. Apneic episodes are not present. Epworth Sleepiness Score is 7.  2. SOB (shortness of breath) on exertion Samantha Lucas notes increasing shortness of breath with exercising and seems to be worsening over time with weight gain. She notes getting out of breath sooner with activity than she used to. This has gotten worse recently. Samantha Lucas denies shortness of breath at rest or orthopnea.  She recently had labs on 06/06/2019.  Will repeat labs in 3 months, around 09/06/2019, but will check vitamin D, TSH, T3, and T4 now.  3. Type 2 diabetes mellitus with other specified complication, without long-term current use of insulin (HCC) A1c on 06/06/2019 was 12.6, FBS 329.  Note from PCP on 07/04/2019 reviewed.  They increased her Trulicity and added glipizide.  She was diagnosed in 06/2012 and is on an ACE inhibitor for renal protection.    Lab Results  Component Value Date   HGBA1C 12.6 (H) 06/06/2019   HGBA1C 11.1 (H) 10/17/2018   HGBA1C 13.2 (H) 09/14/2018   Lab Results  Component Value Date   MICROALBUR <0.2 06/06/2019   LDLCALC 109 (H) 06/06/2019   CREATININE 0.64 06/06/2019   4. Hyperlipidemia associated with type 2 diabetes mellitus (HCC) Samantha Lucas has hyperlipidemia and has been trying to improve her cholesterol levels with intensive lifestyle modification including a low saturated fat diet, exercise and weight loss. She denies any chest pain, claudication or myalgias.  Labs from 06/06/2019 were reviewed with the patient today.  Lab Results  Component Value Date  ALT 9 06/06/2019   AST 11 06/06/2019   ALKPHOS 108 02/07/2016   BILITOT 0.5 06/06/2019   Lab Results  Component Value Date   CHOL 172 06/06/2019   HDL 37 (L) 06/06/2019   LDLCALC 109 (H) 06/06/2019   TRIG 145 06/06/2019   CHOLHDL 4.6 06/06/2019   5. Elevated TSH Recent labs showed elevated TSH.  Samantha Lucas endorses fatigue and shortness of breath.  Lab Results  Component Value  Date   TSH 4.96 (H) 06/06/2019   6. Other depression with emotional eating  Samantha Lucas is struggling with emotional eating and using food for comfort to the extent that it is negatively impacting her health. She has been working on behavior modification techniques to help reduce her emotional eating and has been unsuccessful. She shows no sign of suicidal or homicidal ideations.  She struggles with stress eating and emotions related to overeating/overindulging, etc.  Assessment/Plan:   1. Other fatigue Samantha Lucas does feel that her weight is causing her energy to be lower than it should be. Fatigue may be related to obesity, depression or many other causes. Labs will be ordered, and in the meanwhile, Samantha Lucas will focus on self care including making healthy food choices, increasing physical activity and focusing on stress reduction.  Check labs today. - EKG 12-Lead - VITAMIN D 25 Hydroxy (Vit-D Deficiency, Fractures)  2. SOB (shortness of breath) on exertion Samantha Lucas does feel that she gets out of breath more easily that she used to when she exercises. Samantha Lucas's shortness of breath appears to be obesity related and exercise induced. She has agreed to work on weight loss and gradually increase exercise to treat her exercise induced shortness of breath. Will continue to monitor closely. - VITAMIN D 25 Hydroxy (Vit-D Deficiency, Fractures)  3. Type 2 diabetes mellitus with other specified complication, without long-term current use of insulin (HCC) Samantha Lucas blood Samantha Lucas control is important to decrease the likelihood of diabetic complications such as nephropathy, neuropathy, limb loss, blindness, coronary artery disease, and death. Intensive lifestyle modification including diet, exercise and weight loss are the first line of treatment for diabetes.  Samantha Lucas will focus on a prudent, nutrient-rich diet, weight loss, and continue medications per PCP.  Will continue to monitor alongside them.  Recheck A1c 09/06/2019.  4.  Hyperlipidemia associated with type 2 diabetes mellitus (HCC) Cardiovascular risk and specific lipid/LDL goals reviewed.  We discussed several lifestyle modifications today and Samantha Lucas will continue to work on diet, exercise and weight loss efforts. Orders and follow up as documented in patient record.  A nutrient rich, prudent diet was prescribed today.  Recommend weight loss.  Will follow and recheck labs around 09/06/2019. Counseling Intensive lifestyle modifications are the first line treatment for this issue. . Dietary changes: Increase soluble fiber. Decrease simple carbohydrates. . Exercise changes: Moderate to vigorous-intensity aerobic activity 150 minutes per week if tolerated. . Lipid-lowering medications: see documented in medical record.  5. Elevated TSH Patient with long-standing hypothyroidism, on levothyroxine therapy. She appears euthyroid. Orders and follow up as documented in patient record.  Will check labs today.  Focus on diet and weight loss.  Counseling . Samantha Lucas thyroid control is important for overall health. Supratherapeutic thyroid levels are dangerous and will not improve weight loss results. . The correct way to take levothyroxine is fasting, with water, separated by at least 30 minutes from breakfast, and separated by more than 4 hours from calcium, iron, multivitamins, acid reflux medications (PPIs).  - T3 - T4, free - TSH  6. Other depression with emotional eating  Patient was referred to Dr. Mallie Mussel, our Bariatric Psychologist, for evaluation due to her elevated PHQ-9 score and significant struggles with emotional eating.  7. Class 2 severe obesity with serious comorbidity and body mass index (BMI) of 37.0 to 37.9 in adult, unspecified obesity type (HCC) Samantha Lucas is currently in the action stage of change and her goal is to continue with weight loss efforts. I recommend Samantha Lucas begin the structured treatment plan as follows:  She has agreed to the Category 2  Plan.  Exercise goals: As is.   Behavioral modification strategies: increasing lean protein intake, decreasing simple carbohydrates, meal planning and cooking strategies and planning for success.  She was informed of the importance of frequent follow-up visits to maximize her success with intensive lifestyle modifications for her multiple health conditions. She was informed we would discuss her lab results at her next visit unless there is a critical issue that needs to be addressed sooner. Samantha Lucas agreed to keep her next visit at the agreed upon time to discuss these results.  Objective:   Blood pressure 110/72, pulse 80, temperature 98.6 F (37 C), temperature source Oral, height 5\' 9"  (1.753 m), weight 250 lb (113.4 kg), last menstrual period 07/05/2019, SpO2 98 %. Body mass index is 36.92 kg/m.  EKG: Normal sinus rhythm, rate 87 bpm.  Indirect Calorimeter completed today shows a VO2 of 292 and a REE of 2031.  Her calculated basal metabolic rate is 0737 thus her basal metabolic rate is better than expected.  General: Cooperative, alert, well developed, in no acute distress. HEENT: Conjunctivae and lids unremarkable. Cardiovascular: Regular rhythm.  Lungs: Normal work of breathing. Neurologic: No focal deficits.   Lab Results  Component Value Date   CREATININE 0.64 06/06/2019   BUN 10 06/06/2019   NA 131 (L) 06/06/2019   K 4.5 06/06/2019   CL 97 (L) 06/06/2019   CO2 23 06/06/2019   Lab Results  Component Value Date   ALT 9 06/06/2019   AST 11 06/06/2019   ALKPHOS 108 02/07/2016   BILITOT 0.5 06/06/2019   Lab Results  Component Value Date   HGBA1C 12.6 (H) 06/06/2019   HGBA1C 11.1 (H) 10/17/2018   HGBA1C 13.2 (H) 09/14/2018   HGBA1C 13.4 (H) 03/07/2018   HGBA1C 12.2 (H) 10/05/2017   Lab Results  Component Value Date   TSH 4.96 (H) 06/06/2019   Lab Results  Component Value Date   CHOL 172 06/06/2019   HDL 37 (L) 06/06/2019   LDLCALC 109 (H) 06/06/2019   TRIG 145  06/06/2019   CHOLHDL 4.6 06/06/2019   Lab Results  Component Value Date   WBC 10.0 06/06/2019   HGB 13.0 06/06/2019   HCT 42.2 06/06/2019   MCV 81.5 06/06/2019   PLT 336 06/06/2019   Lab Results  Component Value Date   IRON 18 (L) 07/17/2013   Attestation Statements:   Reviewed by clinician on day of visit: allergies, medications, problem list, medical history, surgical history, family history, social history, and previous encounter notes.  Time spent on visit including pre-visit chart review and post-visit charting and care was 48 minutes.   I, Water quality scientist, CMA, am acting as Location manager for [employer], DO.  I have reviewed the above documentation for accuracy and completeness, and I agree with the above. Mellody Dance, DO

## 2019-07-18 NOTE — Progress Notes (Signed)
Office: [phone]  /  Fax: [phone]    Date: July 20, 2019   Appointment Start Time: 10:58am Duration: 34 minutes Provider: Lawerance Cruel, Psy.D. Type of Session: Intake for Individual Therapy  Location of Patient: Parked in car at work Location of Provider: Provider's Home Type of Contact: Telepsychological Visit via MyChart Video Visit  Informed Consent: Prior to proceeding with today's appointment, two pieces of identifying information were obtained. In addition, Davyn's physical location at the time of this appointment was obtained as well a phone number she could be reached at in the event of technical difficulties. Wana and this provider participated in today's telepsychological service. Of note, with Myeshia's verbal consent, today's appointment was switched to a regular telephone call at 11:01am due to an unstable connection on her end.   The provider's role was explained to Nikkole R Wik. The provider reviewed and discussed issues of confidentiality, privacy, and limits therein (e.g., reporting obligations). In addition to verbal informed consent, written informed consent for psychological services was obtained prior to the initial appointment. Since the clinic is not a 24/7 crisis center, mental health emergency resources were shared and this  provider explained MyChart, e-mail, voicemail, and/or other messaging systems should be utilized only for non-emergency reasons. This provider also explained that information obtained during appointments will be placed in Ovetta's medical record and relevant information will be shared with other providers at [facility] for coordination of care. Moreover, Teirra agreed information may be shared with other [facility] providers as needed for coordination of care. By signing the service agreement document, Rosalena provided written consent for coordination of care. Prior to initiating telepsychological services, Charizma  completed an informed consent document, which included the development of a safety plan (i.e., an emergency contact, nearest emergency room, and emergency resources) in the event of an emergency/crisis. Renise expressed understanding of the rationale of the safety plan. Sueann verbally acknowledged understanding she is ultimately responsible for understanding her insurance benefits for telepsychological and in-person services. This provider also reviewed confidentiality, as it relates to telepsychological services, as well as the rationale for telepsychological services (i.e., to reduce exposure risk to COVID-19). Cambri  acknowledged understanding that appointments cannot be recorded without both party consent and she is aware she is responsible for securing confidentiality on her end of the session. Colene verbally consented to proceed.  Chief Complaint/HPI: Taleyah was referred by Dr. Thomasene Lot due to other depression, with emotional eating. Per the note for the initial visit with Dr. Thomasene Lot on July 18, 2019, "Zynasia is struggling with emotional eating and using food for comfort to the extent that it is negatively impacting her health. She has been working on behavior modification techniques to help reduce her emotional eating and has been unsuccessful. She shows no sign of suicidal or homicidal ideations.  She struggles with stress eating and emotions related to overeating/overindulging, etc." The note for the initial appointment with Dr. Thomasene Lot indicated the following: "Hero's habits were reviewed today and are as follows: Her family eats meals together, she thinks her family will eat healthier with her, her desired weight loss is 56 pounds, she has been heavy most of her life, her heaviest weight ever was 273 pounds, she craves pasta, fast food, and bread, she is frequently drinking liquids with calories, she frequently makes poor food choices, she frequently eats larger portions than  normal and she struggles with emotional eating." Arilla's Food and Mood (modified PHQ-9) score on July 18, 2019 was 5.  During today's appointment, Arlyn noted, "I don't think I emotionally eat." She discussed previously stopping diets due to being bored with foods. She was verbally administered a questionnaire assessing various behaviors related to emotional eating. Tegan endorsed the following: eat certain foods when you are anxious, stressed, depressed, or your feelings are hurt, find food is comforting to you, not worry about what you eat when you are in a good mood and eat as a reward. She shared she craves foods such as pasta and bread. In addition, Isabela denied a history of binge eating; however, discussed having larger meals for dinner on work days. She believes she engages in eating secondary to work stress in the evenings. Giara denied a history of restricting food intake, purging and engagement in other compensatory strategies, and has never been diagnosed with an eating disorder. She also denied a history of treatment for emotional eating. Furthermore, Nani denied other problems of concern.    Mental Status Examination:  Appearance: well groomed and appropriate hygiene  Behavior: appropriate to circumstances Mood: euthymic Affect: mood congruent Speech: normal in rate, volume, and tone Eye Contact: appropriate Psychomotor Activity: appropriate Gait: unable to assess Thought Process: linear, logical, and goal directed  Thought Content/Perception: denies suicidal and homicidal ideation, plan, and intent and no hallucinations, delusions, bizarre thinking or behavior reported or observed Orientation: time, person, place, and purpose of appointment Memory/Concentration: memory, attention, language, and fund of knowledge intact  Insight/Judgment: good  Family & Psychosocial History: Nadiyah reported she is in a relationship and she does not have any children. 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 Additionally, Jalaya shared her highest level of education obtained is a [employer] school diploma and "some college." Currently, Berenise's social support system consists of her friends, mother, sister, and aunt. Moreover, Markel stated she resides with her mother and grandmother.   Medical History:  Past Medical History:  Diagnosis Date  . Back pain   . Diabetes mellitus without complication [facility]) May 2014  . Scoliosis (and kyphoscoliosis), idiopathic   . Viral cardiomyopathy (HCC) 2016   Past Surgical History:  Procedure Laterality Date  . WISDOM TOOTH EXTRACTION  dec 2012   Current Outpatient Medications on File Prior to Visit  Medication Sig Dispense Refill  . Dulaglutide (TRULICITY) 1.5 MG/0.5ML SOPN Inject 0.5 mLs (1.5 mg total) into the skin once a week. 4 pen 2  . fluticasone (FLONASE) 50 MCG/ACT nasal spray Place 2 sprays into both nostrils daily. 16 g 6  . glipiZIDE (GLUCOTROL) 5 MG tablet Take 1 tablet (5 mg total) by mouth 2 (two) times daily before a meal. 60 tablet 3  . Glucose Blood (BLOOD GLUCOSE TEST STRIPS) STRP Use as directed to monitor FSBS 2x daily. Dx: E11.9. 100 each 11  . guaiFENesin-codeine (ROBITUSSIN AC) 100-10 MG/5ML syrup Virtussin AC 10 mg-100 mg/5 mL oral liquid    . Insulin Glargine (BASAGLAR KWIKPEN) 100 UNIT/ML Inject  20 units at bedtime (Patient taking differently: Inject  10 units at bedtime) 15 mL 3  . Insulin Pen Needle (BD PEN NEEDLE NANO U/F) 32G X 4 MM MISC USE AS DIRECTED WITH VICTOZA 100 each 1  . lisinopril (ZESTRIL) 2.5 MG tablet TAKE 1 TABLET BY MOUTH EVERY DAY TO PROTECT KIDNEYS 30 tablet 6  . Multiple Vitamin (MULITIVITAMIN WITH MINERALS) TABS Take 1 tablet by mouth daily.    Marland Kitchen omeprazole (PRILOSEC) 20 MG capsule TAKE 1 CAPSULE(20 MG) BY MOUTH DAILY  30 capsule 3  . vitamin C (ASCORBIC ACID) 500 MG tablet Take 500 mg by mouth daily.     No current facility-administered medications on file prior to visit.    Khailee denied a history of head injuries and loss of consciousness.    Mental Health History: Shatera denied a history of therapeutic and psychiatric services. She expressed desire to initiate longer-term therapeutic services. Alanys reported there is no history of hospitalizations for psychiatric concerns. Jesyca denied a family history of mental health related concerns. Kawanna reported there is no history of trauma including psychological, physical  and sexual abuse, as well as neglect. However, she discussed her father passed away when she was 22 years old secondary to a stroke.   Demaris described her typical mood lately as "a little stressed" and "overwhelmed" due to work. Aside from concerns noted above and endorsed on the PHQ-9 and GAD-7, Clarence reported experiencing crying spells when overwhelmed with work. Delphine denied current alcohol use. She denied tobacco use. She denied illicit/recreational substance use. Regarding caffeine intake, Natacha reported consuming soda "every other day" and sweet tea when eating out. Furthermore, Laqueisha indicated she is not experiencing the following: hallucinations and delusions, paranoia, symptoms of mania , social withdrawal, panic attacks and decreased motivation. She also denied history of and current suicidal ideation, plan, and intent; history of and current homicidal ideation, plan, and intent; and history of and current engagement in self-harm.  The following strengths were reported by Tyteanna: caring, responsible, accountable, and reliable. The following strengths were observed by this provider: ability to express thoughts and feelings during the therapeutic session, ability to establish and benefit from a therapeutic relationship, willingness to work toward established goal(s) with the clinic and ability to engage in reciprocal conversation.   Legal History: Yarima reported there is no history of legal involvement.   Structured Assessments Results: The Patient Health  Questionnaire-9 (PHQ-9) is a self-report measure that assesses symptoms and severity of depression over the course of the last two weeks. Pauleen obtained a score of 2 suggesting minimal depression. Milanni finds the endorsed symptoms to be somewhat difficult. [0= Not at all; 1= Several days; 2= More than half the days; 3= Nearly every day] Little interest or pleasure in doing things 0  Feeling down, depressed, or hopeless 0  Trouble falling or staying asleep, or sleeping too much 0  Feeling tired or having little energy 1  Poor appetite or overeating 1  Feeling bad about yourself --- or that you are a failure or have let yourself or your family down 0  Trouble concentrating on things, such as reading the newspaper or watching television 0  Moving or speaking so slowly that other people could have noticed? Or the opposite --- being so fidgety or restless that you have been moving around a lot more than usual 0  Thoughts that you would be better off dead or hurting yourself in some way 0  PHQ-9 Score 2    The Generalized Anxiety Disorder-7 (GAD-7) is a brief self-report measure that assesses symptoms of anxiety over the course of the last two weeks. Nyna obtained a score of 4 suggesting minimal anxiety. Alexsus finds the endorsed symptoms to be somewhat difficult. [0= Not at all; 1= Several days; 2= Over half the days; 3= Nearly every day] Feeling nervous, anxious, on edge 3  Not being able to stop or control worrying 0  Worrying too much about different things 0  Trouble relaxing 0  Being so restless  that it's hard to sit still 0  Becoming easily annoyed or irritable 1  Feeling afraid as if something awful might happen 0  GAD-7 Score 4   Interventions:  Conducted a chart review Focused on rapport building Verbally administered PHQ-9 and GAD-7 for symptom monitoring Verbally administered Food & Mood questionnaire to assess various behaviors related to emotional eating Provided emphatic  reflections and validation Collaborated with patient on a treatment goal  Psychoeducation provided regarding physical versus emotional hunger  Discussed longer-term therapeutic services  Provisional DSM-5 Diagnosis(es): 307.59 (F50.8) Other Specified Feeding or Eating Disorder, Emotional Eating Behaviors and 300.00 (F41.9) Unspecified Anxiety Disorder  Plan: Thaila appears able and willing to participate as evidenced by collaboration on a treatment goal, engagement in reciprocal conversation, and asking questions as needed for clarification. Based on Genita's work schedule and appointment availability, the next appointment will be scheduled in 2-3 weeks, which will be via Bee Visit. The following treatment goal was established: increase coping skills. This provider will regularly review the treatment plan and medical chart to keep informed of status changes. Terrianne expressed understanding and agreement with the initial treatment plan of care. With Vista's verbal consent, a list of referral options was e-mailed to her. Tymber will be sent a handout via e-mail to utilize between now and the next appointment to increase awareness of hunger patterns and subsequent eating. Pernell provided verbal consent during today's appointment for this provider to send the handout via e-mail.

## 2019-07-19 LAB — T3: T3, Total: 145 ng/dL (ref 71–180)

## 2019-07-19 LAB — VITAMIN D 25 HYDROXY (VIT D DEFICIENCY, FRACTURES): Vit D, 25-Hydroxy: 22.4 ng/mL — ABNORMAL LOW (ref 30.0–100.0)

## 2019-07-19 LAB — T4, FREE: Free T4: 1.28 ng/dL (ref 0.82–1.77)

## 2019-07-19 LAB — TSH: TSH: 2.1 u[IU]/mL (ref 0.450–4.500)

## 2019-07-20 ENCOUNTER — Telehealth (INDEPENDENT_AMBULATORY_CARE_PROVIDER_SITE_OTHER): Payer: No Typology Code available for payment source | Admitting: Psychology

## 2019-07-20 ENCOUNTER — Other Ambulatory Visit: Payer: Self-pay

## 2019-07-20 DIAGNOSIS — F419 Anxiety disorder, unspecified: Secondary | ICD-10-CM | POA: Diagnosis not present

## 2019-07-20 DIAGNOSIS — F5089 Other specified eating disorder: Secondary | ICD-10-CM | POA: Diagnosis not present

## 2019-07-26 NOTE — Progress Notes (Signed)
  Office: [phone]  /  Fax: [phone]    Date: August 09, 2019   Appointment Start Time: 4:30pm Duration: 22 minutes Provider: Lawerance Cruel, Psy.D. 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 female presenting via MyChart Video Visit for a follow-up appointment to address the previously established treatment goal of increasing coping skills. Today's appointment was a telepsychological visit due to COVID-19. [name] provided verbal consent for today's telepsychological appointment and she is aware she is responsible for securing confidentiality on her end of the session. Prior to proceeding with today's appointment, [name]'s physical location at the time of this appointment was obtained as well a phone number she could be reached at in the event of technical difficulties. [name] and this provider participated in today's telepsychological service.   This provider conducted a brief check-in. [name] stated she lost three pounds and noted limited deviations from her structured meal plan. She reported an increase in physical activity and water intake. Positive reinforcement was provided. Emotional and physical hunger were reviewed. Psychoeducation regarding triggers for emotional eating was provided. [name] was provided a handout, and encouraged to utilize the handout between now and the next appointment to increase awareness of triggers and frequency. [name] agreed. This provider also discussed behavioral strategies for specific triggers, such as placing the utensil down when conversing to avoid mindless eating. [name] provided verbal consent during today's appointment for this provider to send a handout about triggers via e-mail. [name] was receptive to today's appointment as evidenced by openness to sharing, responsiveness to feedback, and willingness  to explore triggers for emotional eating.  Mental Status Examination:  Appearance: well groomed and appropriate hygiene  Behavior: appropriate to circumstances Mood: euthymic Affect: mood congruent Speech: normal in rate, volume, and tone Eye Contact: appropriate Psychomotor Activity: appropriate Gait: unable to assess Thought Process: linear, logical, and goal directed  Thought Content/Perception: no hallucinations, delusions, bizarre thinking or behavior reported or observed and no evidence of suicidal and homicidal ideation, plan, and intent Orientation: time, person, place, and purpose of appointment Memory/Concentration: memory, attention, language, and fund of knowledge intact  Insight/Judgment: good  Interventions:  Conducted a brief chart review Provided empathic reflections and validation Reviewed content from the previous session Employed supportive psychotherapy interventions to facilitate reduced distress and to improve coping skills with identified stressors Psychoeducation provided regarding triggers for emotional eating  Provided positive reinforcement   DSM-5 Diagnosis(es): 307.59 (F50.8) Other Specified Feeding or Eating Disorder, Emotional Eating Behaviors and 300.00 (F41.9) Unspecified Anxiety Disorder  Treatment Goal & Progress: During the initial appointment with this provider, the following treatment goal was established: increase coping skills.   Plan: The next appointment will be scheduled in 2-3 weeks, which will be via MyChart Video Visit. The next session will focus on working towards the established treatment goal. Of note, [name] stated she did not have the chance to call any referral options previously shared, but noted a plan to do so.

## 2019-07-27 ENCOUNTER — Encounter (INDEPENDENT_AMBULATORY_CARE_PROVIDER_SITE_OTHER): Payer: Self-pay

## 2019-08-01 ENCOUNTER — Other Ambulatory Visit: Payer: Self-pay

## 2019-08-01 ENCOUNTER — Ambulatory Visit (INDEPENDENT_AMBULATORY_CARE_PROVIDER_SITE_OTHER): Payer: No Typology Code available for payment source | Admitting: Family Medicine

## 2019-08-01 ENCOUNTER — Encounter (INDEPENDENT_AMBULATORY_CARE_PROVIDER_SITE_OTHER): Payer: Self-pay | Admitting: Family Medicine

## 2019-08-01 VITALS — BP 139/80 | HR 84 | Temp 98.3°F | Ht 69.0 in | Wt 247.0 lb

## 2019-08-01 DIAGNOSIS — E559 Vitamin D deficiency, unspecified: Secondary | ICD-10-CM | POA: Diagnosis not present

## 2019-08-01 DIAGNOSIS — R7989 Other specified abnormal findings of blood chemistry: Secondary | ICD-10-CM | POA: Diagnosis not present

## 2019-08-01 DIAGNOSIS — E1159 Type 2 diabetes mellitus with other circulatory complications: Secondary | ICD-10-CM | POA: Diagnosis not present

## 2019-08-01 DIAGNOSIS — Z9189 Other specified personal risk factors, not elsewhere classified: Secondary | ICD-10-CM

## 2019-08-01 DIAGNOSIS — E1169 Type 2 diabetes mellitus with other specified complication: Secondary | ICD-10-CM | POA: Diagnosis not present

## 2019-08-01 DIAGNOSIS — I10 Essential (primary) hypertension: Secondary | ICD-10-CM

## 2019-08-01 DIAGNOSIS — Z6836 Body mass index (BMI) 36.0-36.9, adult: Secondary | ICD-10-CM

## 2019-08-01 DIAGNOSIS — I152 Hypertension secondary to endocrine disorders: Secondary | ICD-10-CM

## 2019-08-01 DIAGNOSIS — E785 Hyperlipidemia, unspecified: Secondary | ICD-10-CM

## 2019-08-02 MED ORDER — VITAMIN D (ERGOCALCIFEROL) 1.25 MG (50000 UNIT) PO CAPS: 50000.0000 [IU] | ORAL_CAPSULE | ORAL | 0 refills | Status: DC

## 2019-08-02 NOTE — Progress Notes (Signed)
Chief Complaint:   OBESITY Samantha Lucas is here to discuss her progress with her obesity treatment plan along with follow-up of her obesity related diagnoses. Samantha Lucas is on the Category 2 Plan and states she is following her eating plan approximately 100% of the time. Samantha Lucas states she is exercising for 0 minutes 0 times per week.  Today's visit was #: 2 Starting weight: 250 lbs Starting date: 07/18/2019 Today's weight: 247 lbs Today's date: 08/01/2019 Total lbs lost to date: 3 lbs Total lbs lost since last in-office visit: 3 lbs  Interim History: Samantha Lucas says she enjoyed the plan and food.  "I like it."  Cravings are well-controlled as is hunger.  She ate [employer] one morning, and it caused GI upset.  She says her energy levels are better and she is even less tired.  Her mother, whom she lives with, was asking her why she eats so little and encourages her to eat more.  Her other family members that she eats with are not "healthy eaters" but that does not deter her from following the plan.  Subjective:   1. Type 2 diabetes mellitus with other specified complication, without long-term current use of insulin (HCC) Medications reviewed. Diabetic ROS: no polyuria or polydipsia, no chest pain, dyspnea or TIA's, no numbness, tingling or pain in extremities.  Fasting blood sugars previously were in the Pag[place] 200s/300s.  They have now been in the low 200s.  She says she is feeling better with healthier eating.  Lab Results  Component Value Date   HGBA1C 12.6 (H) 06/06/2019   HGBA1C 11.1 (H) 10/17/2018   HGBA1C 13.2 (H) 09/14/2018   Lab Results  Component Value Date   MICROALBUR <0.2 06/06/2019   LDLCALC 109 (H) 06/06/2019   CREATININE 0.64 06/06/2019   2. Hyperlipidemia associated with type 2 diabetes mellitus (HCC) Samantha Lucas has hyperlipidemia and has been trying to improve her cholesterol levels with intensive lifestyle modification including a low saturated fat diet, exercise and weight loss. She  denies any chest pain, claudication or myalgias.  Her PCP obtained labs from 06/06/2019.  Samantha Lucas says she was told to follow a healthy diet and weight loss to improve levels.  Lab Results  Component Value Date   ALT 9 06/06/2019   AST 11 06/06/2019   ALKPHOS 108 02/07/2016   BILITOT 0.5 06/06/2019   Lab Results  Component Value Date   CHOL 172 06/06/2019   HDL 37 (L) 06/06/2019   LDLCALC 109 (H) 06/06/2019   TRIG 145 06/06/2019   CHOLHDL 4.6 06/06/2019   3. Elevated TSH Samantha Lucas has history of recent elevated TSH.  Now, repeat labs are within normal limits.    Lab Results  Component Value Date   TSH 2.100 07/18/2019   4. Vitamin D deficiency Samantha Lucas's Vitamin D level was 22.4 on 07/18/2019. She is currently taking a multivitamin. She denies nausea, vomiting or muscle weakness.  She was never told she had low vitamin D in the past.  She is not sure if it was ever checked before.  5. Hypertension associated with diabetes (HCC) Review: taking medications as instructed, no medication side effects noted, no chest pain on exertion, no dyspnea on exertion, no swelling of ankles.  Samantha Lucas's blood pressure was elevated today.  She states she is newly diagnosis with hypertension, but she is on lisinopril and her blood pressure is not at goal of 130/80 or less today.  She says she ate a higher salt meal for breakfast.  She  placed spices on eggs this morning.  It tasted very salty, per patient.  BP Readings from Last 3 Encounters:  08/01/19 139/80  07/18/19 110/72  07/04/19 118/64   6. At risk for hypoglycemia Samantha Lucas is at increased risk for hypoglycemia due to changes in diet, diagnosis of diabetes, and/or insulin use. Samantha Lucas is currently taking insulin.  Samantha Lucas says she will be extra vigilant with following the nutrition plan and knows her blood sugar will drop.  Assessment/Plan:   1. Type 2 diabetes mellitus with other specified complication, without long-term current use of insulin (HCC) Good  blood sugar control is important to decrease the likelihood of diabetic complications such as nephropathy, neuropathy, limb loss, blindness, coronary artery disease, and death. Intensive lifestyle modification including diet, exercise and weight loss are the first line of treatment for diabetes.  Recheck A1c around 09/06/2019.  Prudent nutritional plan, closely monitor FBS, and check it if having any symptoms of hypoglycemia.  Continue weight loss and we will decrease insulin as blood sugar comes down.  2. Hyperlipidemia associated with type 2 diabetes mellitus (HCC) Cardiovascular risk and specific lipid/LDL goals reviewed.  We discussed several lifestyle modifications today and Samantha Lucas will continue to work on diet, exercise and weight loss efforts. Orders and follow up as documented in patient record.  Goal LDL as patient with DM is less than 70.  Continue weight loss, prudent nutritional plan, and recheck labs in 3-4 months.  If not at goal, then would recommend statin.  Counseling Intensive lifestyle modifications are the first line treatment for this issue. . Dietary changes: Increase soluble fiber. Decrease simple carbohydrates. . Exercise changes: Moderate to vigorous-intensity aerobic activity 150 minutes per week if tolerated. . Lipid-lowering medications: see documented in medical record.  3. Elevated TSH Patient with long-standing hypothyroidism, on levothyroxine therapy. She appears euthyroid. Orders and follow up as documented in patient record.  Continue prudent nutritional plan, weight loss.  Counseling . Good thyroid control is important for overall health. Supratherapeutic thyroid levels are dangerous and will not improve weight loss results. . The correct way to take levothyroxine is fasting, with water, separated by at least 30 minutes from breakfast, and separated by more than 4 hours from calcium, iron, multivitamins, acid reflux medications (PPIs).   4. Vitamin D deficiency Low  Vitamin D level contributes to fatigue and are associated with obesity, breast, and colon cancer. She agrees to start to take prescription Vitamin D @50 ,000 IU every week and will follow-up for routine testing of Vitamin D, at least 2-3 times per year to avoid over-replacement. - Vitamin D, Ergocalciferol, (DRISDOL) 1.25 MG (50000 UNIT) CAPS capsule; Take 1 capsule (50,000 Units total) by mouth every 7 (seven) days.  Dispense: 4 capsule; Refill: 0  5. Hypertension associated with diabetes (HCC) Samantha Lucas is working on healthy weight loss and exercise to improve blood pressure control. We will watch for signs of hypotension as she continues her lifestyle modifications.  Continue ACE inhibitor at current dose.  We will continue close monitoring every 2 weeks.  Check at home if possible.  6. At risk for hypoglycemia Samantha Lucas was given approximately 15 minutes of counseling today regarding prevention of hypoglycemia. She was advised of symptoms of hypoglycemia. Samantha Lucas was instructed to avoid skipping meals, eat regular protein rich meals and schedule low calorie snacks as needed.   Repetitive spaced learning was employed today to elicit superior memory formation and behavioral change  7. Class 2 severe obesity with serious comorbidity and body mass index (  BMI) of 36.0 to 36.9 in adult, unspecified obesity type (HCC) Samantha Lucas is currently in the action stage of change. As such, her goal is to continue with weight loss efforts. She has agreed to the Category 2 Plan.   Exercise goals: As is.  Behavioral modification strategies: increasing water intake (recommend 80+ ounces per day) and meal planning and cooking strategies.  Samantha Lucas has agreed to follow-up with our clinic in 2 weeks. She was informed of the importance of frequent follow-up visits to maximize her success with intensive lifestyle modifications for her multiple health conditions.   Objective:   Blood pressure 139/80, pulse 84, temperature 98.3 F  (36.8 C), temperature source Oral, height 5\' 9"  (1.753 m), weight 247 lb (112 kg), last menstrual period 07/05/2019, SpO2 99 %. Body mass index is 36.48 kg/m.  General: Cooperative, alert, well developed, in no acute distress. HEENT: Conjunctivae and lids unremarkable. Cardiovascular: Regular rhythm.  Lungs: Normal work of breathing. Neurologic: No focal deficits.   Lab Results  Component Value Date   CREATININE 0.64 06/06/2019   BUN 10 06/06/2019   NA 131 (L) 06/06/2019   K 4.5 06/06/2019   CL 97 (L) 06/06/2019   CO2 23 06/06/2019   Lab Results  Component Value Date   ALT 9 06/06/2019   AST 11 06/06/2019   ALKPHOS 108 02/07/2016   BILITOT 0.5 06/06/2019   Lab Results  Component Value Date   HGBA1C 12.6 (H) 06/06/2019   HGBA1C 11.1 (H) 10/17/2018   HGBA1C 13.2 (H) 09/14/2018   HGBA1C 13.4 (H) 03/07/2018   HGBA1C 12.2 (H) 10/05/2017   Lab Results  Component Value Date   TSH 2.100 07/18/2019   Lab Results  Component Value Date   CHOL 172 06/06/2019   HDL 37 (L) 06/06/2019   LDLCALC 109 (H) 06/06/2019   TRIG 145 06/06/2019   CHOLHDL 4.6 06/06/2019   Lab Results  Component Value Date   WBC 10.0 06/06/2019   HGB 13.0 06/06/2019   HCT 42.2 06/06/2019   MCV 81.5 06/06/2019   PLT 336 06/06/2019   Lab Results  Component Value Date   IRON 18 (L) 07/17/2013   Attestation Statements:   Reviewed by clinician on day of visit: allergies, medications, problem list, medical history, surgical history, family history, social history, and previous encounter notes.  I, 07/19/2013, CMA, am acting as Insurance claims handler for Energy manager, DO.  I have reviewed the above documentation for accuracy and completeness, and I agree with the above. [employer], DO

## 2019-08-09 ENCOUNTER — Telehealth (INDEPENDENT_AMBULATORY_CARE_PROVIDER_SITE_OTHER): Payer: No Typology Code available for payment source | Admitting: Psychology

## 2019-08-09 ENCOUNTER — Other Ambulatory Visit: Payer: Self-pay

## 2019-08-09 DIAGNOSIS — F419 Anxiety disorder, unspecified: Secondary | ICD-10-CM | POA: Diagnosis not present

## 2019-08-09 DIAGNOSIS — F5089 Other specified eating disorder: Secondary | ICD-10-CM

## 2019-08-15 NOTE — Progress Notes (Signed)
  Office: [phone]  /  Fax: [phone]    Date: August 29, 2019   Appointment Start Time: 4:24pm Duration: 20 minutes Provider: Lawerance Cruel, Psy.D. Type of Session: Individual Therapy  Location of Patient: Home Location of Provider: [facility] Type of Contact: Telepsychological Visit via MyChart Video Visit  Session Content: Samantha Lucas is a 29 y.o. female presenting via MyChart Video Visit for a follow-up appointment to address the previously established treatment goal of increasing coping skills. Today's appointment was a telepsychological visit due to COVID-19. Samantha Lucas provided verbal consent for today's telepsychological appointment and she is aware she is responsible for securing confidentiality on her end of the session. Prior to proceeding with today's appointment, Samantha Lucas's physical location at the time of this appointment was obtained as well a phone number she could be reached at in the event of technical difficulties. Samantha Lucas and this provider participated in today's telepsychological service.   This provider conducted a brief check-in. Samantha Lucas stated she is "still doing good" with her eating habits. She reported she continues to focus on water intake and ceased soda use. Triggers for emotionally eating reviewed. She shared about instances where she applied discussed strategies. Psychoeducation regarding mindfulness was provided. A handout was provided to Samantha Lucas with further information regarding mindfulness, including exercises. This provider also explained the benefit of mindfulness as it relates to emotional eating. Samantha Lucas was encouraged to engage in the provided exercises between now and the next appointment with this provider. Samantha Lucas agreed. During today's appointment, Samantha Lucas was led through a mindfulness exercise involving her senses. Samantha Lucas provided verbal consent during today's appointment for this provider to send a handout about mindfulness via e-mail. Samantha Lucas was  receptive to today's appointment as evidenced by openness to sharing, responsiveness to feedback, and willingness to engage in mindfulness exercises to assist with coping.  Mental Status Examination:  Appearance: well groomed and appropriate hygiene  Behavior: appropriate to circumstances Mood: euthymic Affect: mood congruent Speech: normal in rate, volume, and tone Eye Contact: appropriate Psychomotor Activity: appropriate Gait: unable to assess Thought Process: linear, logical, and goal directed  Thought Content/Perception: no hallucinations, delusions, bizarre thinking or behavior reported or observed and no evidence of suicidal and homicidal ideation, plan, and intent Orientation: time, person, place, and purpose of appointment Memory/Concentration: memory, attention, language, and fund of knowledge intact  Insight/Judgment: good  Interventions:  Conducted a brief chart review Provided empathic reflections and validation Reviewed content from the previous session Employed supportive psychotherapy interventions to facilitate reduced distress and to improve coping skills with identified stressors Psychoeducation provided regarding mindfulness Engaged patient in mindfulness exercise(s) Employed acceptance and commitment interventions to emphasize mindfulness and acceptance without struggle  DSM-5 Diagnosis(es): 307.59 (F50.8) Other Specified Feeding or Eating Disorder, Emotional Eating Behaviors and 300.00 (F41.9) Unspecified Anxiety Disorder  Treatment Goal & Progress: During the initial appointment with this provider, the following treatment goal was established: increase coping skills. Samantha Lucas has demonstrated progress in her goal as evidenced by increased awareness of hunger patterns and increased awareness of triggers for emotional eating. Samantha Lucas also demonstrates willingness to engage in mindfulness exercises.  Plan: Based on Samantha Lucas's work schedule, the next appointment will be  scheduled in approximately three weeks, which will be via MyChart Video Visit. The next session will focus on working towards the established treatment goal.

## 2019-08-16 ENCOUNTER — Ambulatory Visit (INDEPENDENT_AMBULATORY_CARE_PROVIDER_SITE_OTHER): Payer: No Typology Code available for payment source | Admitting: Family Medicine

## 2019-08-16 ENCOUNTER — Encounter (INDEPENDENT_AMBULATORY_CARE_PROVIDER_SITE_OTHER): Payer: Self-pay | Admitting: Family Medicine

## 2019-08-16 ENCOUNTER — Other Ambulatory Visit: Payer: Self-pay

## 2019-08-16 VITALS — BP 120/68 | HR 67 | Temp 98.8°F | Ht 69.0 in | Wt 246.0 lb

## 2019-08-16 DIAGNOSIS — Z794 Long term (current) use of insulin: Secondary | ICD-10-CM

## 2019-08-16 DIAGNOSIS — E559 Vitamin D deficiency, unspecified: Secondary | ICD-10-CM | POA: Diagnosis not present

## 2019-08-16 DIAGNOSIS — E1169 Type 2 diabetes mellitus with other specified complication: Secondary | ICD-10-CM | POA: Diagnosis not present

## 2019-08-16 DIAGNOSIS — Z6836 Body mass index (BMI) 36.0-36.9, adult: Secondary | ICD-10-CM

## 2019-08-16 DIAGNOSIS — Z9189 Other specified personal risk factors, not elsewhere classified: Secondary | ICD-10-CM

## 2019-08-16 MED ORDER — VITAMIN D (ERGOCALCIFEROL) 1.25 MG (50000 UNIT) PO CAPS: 50000.0000 [IU] | ORAL_CAPSULE | ORAL | 0 refills | Status: DC

## 2019-08-17 NOTE — Progress Notes (Signed)
Chief Complaint:   OBESITY Samantha Lucas is here to discuss her progress with her obesity treatment plan along with follow-up of her obesity related diagnoses. Samantha Lucas is on the Category 2 Plan and states she is following her eating plan approximately 90% of the time. Samantha Lucas states she is using kettle bells for 10-15 minutes 3-4 times per week.  Today's visit was #: 3 Starting weight: 250 lbs Starting date: 07/18/2019 Today's weight: 246 lbs Today's date: 08/21/2019 Total lbs lost to date: 4 lbs Total lbs lost since last in-office visit: 1 lb  Interim History: Samantha Lucas says that she went out for dinner a couple of times.  She is drinking a lot more water, almost a gallon per day, and says she feels much better.  She is skipping some meals.  Overall, she says she is very proud of herself for making changes because her blood sugars are much better overall and she is feeling "so much better".  She still thinks she an improve a lot.  Subjective:   1. Type 2 diabetes mellitus with other specified complication, with long-term current use of insulin (HCC) Medications reviewed. Diabetic ROS: no polyuria or polydipsia, no chest pain, dyspnea or TIA's, no numbness, tingling or pain in extremities.  Fasting blood sugar is 121 this morning and overall has been so much better.  She is usually in the 200s.  She is now in the 120-150 range on average.  Lab Results  Component Value Date   HGBA1C 12.6 (H) 06/06/2019   HGBA1C 11.1 (H) 10/17/2018   HGBA1C 13.2 (H) 09/14/2018   Lab Results  Component Value Date   MICROALBUR <0.2 06/06/2019   LDLCALC 109 (H) 06/06/2019   CREATININE 0.64 06/06/2019   2. Vitamin D deficiency Samantha Lucas's Vitamin D level was 22.4 on 07/18/2019. She is currently taking prescription vitamin D 50,000 IU each week. She denies nausea, vomiting or muscle weakness.  3. At risk for hypoglycemia Samantha Lucas is at increased risk for hypoglycemia due to changes in diet, diagnosis of diabetes, and/or  insulin use. Samantha Lucas is currently taking insulin.   Assessment/Plan:   1. Type 2 diabetes mellitus with other specified complication, with long-term current use of insulin (HCC) Good blood sugar control is important to decrease the likelihood of diabetic complications such as nephropathy, neuropathy, limb loss, blindness, coronary artery disease, and death. Intensive lifestyle modification including diet, exercise and weight loss are the first line of treatment for diabetes.  Continue to monitor blood sugars at home.  Call PCP if it goes too low or you have symptoms.  Continue prudent nutritional plan, weight loss.  2. Vitamin D deficiency Low Vitamin D level contributes to fatigue and are associated with obesity, breast, and colon cancer. She agrees to continue to take prescription Vitamin D @50 ,000 IU every week and will follow-up for routine testing of Vitamin D, at least 2-3 times per year to avoid over-replacement. -  REFILL Vitamin D, Ergocalciferol, (DRISDOL) 1.25 MG (50000 UNIT) CAPS capsule; Take 1 capsule (50,000 Units total) by mouth every 7 (seven) days.  Dispense: 4 capsule; Refill: 0  3. At risk for hypoglycemia Samantha Lucas was given approximately 15 minutes of counseling today regarding prevention of hypoglycemia. She was advised of symptoms of hypoglycemia. Samantha Lucas was instructed to avoid skipping meals, eat regular protein rich meals and schedule low calorie snacks as needed.   Repetitive spaced learning was employed today to elicit superior memory formation and behavioral change  4. Class 2 severe obesity with  serious comorbidity and body mass index (BMI) of 36.0 to 36.9 in adult, unspecified obesity type (HCC) Samantha Lucas is currently in the action stage of change. As such, her goal is to continue with weight loss efforts. She has agreed to the Category 2 Plan.   Exercise goals: As is.  Behavioral modification strategies: increasing lean protein intake, increasing water intake, decreasing  eating out and better snacking choices.  Goals:  No skipping meals, increase water intake, increase some exercise to 5 days per week.  Samantha Lucas has agreed to follow-up with our clinic in 2 weeks. She was informed of the importance of frequent follow-up visits to maximize her success with intensive lifestyle modifications for her multiple health conditions.   Objective:   Blood pressure 120/68, pulse 67, temperature 98.8 F (37.1 C), height 5\' 9"  (1.753 m), weight 246 lb (111.6 kg), SpO2 99 %. Body mass index is 36.33 kg/m.  General: Cooperative, alert, well developed, in no acute distress. HEENT: Conjunctivae and lids unremarkable. Cardiovascular: Regular rhythm.  Lungs: Normal work of breathing. Neurologic: No focal deficits.   Lab Results  Component Value Date   CREATININE 0.64 06/06/2019   BUN 10 06/06/2019   NA 131 (L) 06/06/2019   K 4.5 06/06/2019   CL 97 (L) 06/06/2019   CO2 23 06/06/2019   Lab Results  Component Value Date   ALT 9 06/06/2019   AST 11 06/06/2019   ALKPHOS 108 02/07/2016   BILITOT 0.5 06/06/2019   Lab Results  Component Value Date   HGBA1C 12.6 (H) 06/06/2019   HGBA1C 11.1 (H) 10/17/2018   HGBA1C 13.2 (H) 09/14/2018   HGBA1C 13.4 (H) 03/07/2018   HGBA1C 12.2 (H) 10/05/2017   Lab Results  Component Value Date   TSH 2.100 07/18/2019   Lab Results  Component Value Date   CHOL 172 06/06/2019   HDL 37 (L) 06/06/2019   LDLCALC 109 (H) 06/06/2019   TRIG 145 06/06/2019   CHOLHDL 4.6 06/06/2019   Lab Results  Component Value Date   WBC 10.0 06/06/2019   HGB 13.0 06/06/2019   HCT 42.2 06/06/2019   MCV 81.5 06/06/2019   PLT 336 06/06/2019   Lab Results  Component Value Date   IRON 18 (L) 07/17/2013   Attestation Statements:   Reviewed by clinician on day of visit: allergies, medications, problem list, medical history, surgical history, family history, social history, and previous encounter notes.  I, 07/19/2013, CMA, am acting as  Insurance claims handler for Energy manager, DO.  I have reviewed the above documentation for accuracy and completeness, and I agree with the above. [employer], DO

## 2019-08-29 ENCOUNTER — Telehealth (INDEPENDENT_AMBULATORY_CARE_PROVIDER_SITE_OTHER): Payer: No Typology Code available for payment source | Admitting: Psychology

## 2019-08-29 ENCOUNTER — Other Ambulatory Visit: Payer: Self-pay

## 2019-08-29 DIAGNOSIS — F419 Anxiety disorder, unspecified: Secondary | ICD-10-CM | POA: Diagnosis not present

## 2019-08-29 DIAGNOSIS — F5089 Other specified eating disorder: Secondary | ICD-10-CM | POA: Diagnosis not present

## 2019-09-04 ENCOUNTER — Other Ambulatory Visit: Payer: Self-pay

## 2019-09-04 ENCOUNTER — Ambulatory Visit (INDEPENDENT_AMBULATORY_CARE_PROVIDER_SITE_OTHER): Payer: No Typology Code available for payment source | Admitting: Family Medicine

## 2019-09-04 ENCOUNTER — Encounter (INDEPENDENT_AMBULATORY_CARE_PROVIDER_SITE_OTHER): Payer: Self-pay | Admitting: Family Medicine

## 2019-09-04 VITALS — BP 117/65 | HR 61 | Temp 98.2°F | Ht 69.0 in | Wt 246.0 lb

## 2019-09-04 DIAGNOSIS — E559 Vitamin D deficiency, unspecified: Secondary | ICD-10-CM

## 2019-09-04 DIAGNOSIS — Z794 Long term (current) use of insulin: Secondary | ICD-10-CM

## 2019-09-04 DIAGNOSIS — Z6836 Body mass index (BMI) 36.0-36.9, adult: Secondary | ICD-10-CM

## 2019-09-04 DIAGNOSIS — E1169 Type 2 diabetes mellitus with other specified complication: Secondary | ICD-10-CM | POA: Diagnosis not present

## 2019-09-05 NOTE — Progress Notes (Signed)
Chief Complaint:   OBESITY Samantha Lucas is here to discuss her progress with her obesity treatment plan along with follow-up of her obesity related diagnoses. Samantha Lucas is on the Category 2 Plan and states she is following her eating plan approximately 80% of the time. Samantha Lucas states she is doing Hospital doctor on her yoga mat for 15-30 minutes 5 times per week.  Today's visit was #: 4 Starting weight: 250 lbs Starting date: 07/18/2019 Today's weight: 246 lbs Today's date: 09/04/2019 Total lbs lost to date: 4 lbs Total lbs lost since last in-office visit: 0  Interim History: Samantha Lucas says the dinner meal is the toughest for her.  She gets home late and has no time to cook, so she ends up eating out at [facility] (sandwich or salad).  Subjective:   1. Vitamin D deficiency Samantha Lucas's Vitamin D level was 22.4 on 07/18/2019. She is currently taking prescription vitamin D 50,000 IU each week. She denies nausea, vomiting or muscle weakness.  She is not taking vitamin D yet.  She never picked it up from the pharmacy.  2. Type 2 diabetes mellitus with other specified complication, with long-term current use of insulin (HCC) Medications reviewed. Diabetic ROS: no polyuria or polydipsia, no chest pain, dyspnea or TIA's, no numbness, tingling or pain in extremities.  FBS in the 200s.  Still not taking her medication, just insulin because she ran out of the medication.  Lab Results  Component Value Date   HGBA1C 12.6 (H) 06/06/2019   HGBA1C 11.1 (H) 10/17/2018   HGBA1C 13.2 (H) 09/14/2018   Lab Results  Component Value Date   MICROALBUR <0.2 06/06/2019   LDLCALC 109 (H) 06/06/2019   CREATININE 0.64 06/06/2019   Assessment/Plan:   1. Vitamin D deficiency Low Vitamin D level contributes to fatigue and are associated with obesity, breast, and colon cancer. She agrees to continue to take prescription Vitamin D @50 ,000 IU every week and will follow-up for routine testing of Vitamin D, at least 2-3 times per year  to avoid over-replacement.  Continue medication, prudent nutritional plan, weight loss.  2. Type 2 diabetes mellitus with other specified complication, with long-term current use of insulin (HCC) Good blood sugar control is important to decrease the likelihood of diabetic complications such as nephropathy, neuropathy, limb loss, blindness, coronary artery disease, and death. Intensive lifestyle modification including diet, exercise and weight loss are the first line of treatment for diabetes.  Treatment plan per specialists/PCP. Continue prudent nutritional plan, weight loss.  3. Class 2 severe obesity with serious comorbidity and body mass index (BMI) of 36.0 to 36.9 in adult, unspecified obesity type (HCC) Samantha Lucas is currently in the action stage of change. As such, her goal is to continue with weight loss efforts. She has agreed to the Category 2 Plan with addition of meal plan ideas for dinner(handouts given)  and gave her vegan dinner options (handouts given)  as well.   Exercise goals: As is.  Behavioral modification strategies: increasing lean protein intake, decreasing simple carbohydrates, decreasing eating out, no skipping meals, meal planning and cooking strategies, keeping healthy foods in the home and planning for success.  Samantha Lucas has agreed to follow-up with our clinic in 2 weeks. She was informed of the importance of frequent follow-up visits to maximize her success with intensive lifestyle modifications for her multiple health conditions.   Objective:   Blood pressure 117/65, pulse 61, temperature 98.2 F (36.8 C), height 5\' 9"  (1.753 m), weight 246 lb (111.6 kg),  SpO2 98 %. Body mass index is 36.33 kg/m.  General: Cooperative, alert, well developed, in no acute distress. HEENT: Conjunctivae and lids unremarkable. Cardiovascular: Regular rhythm.  Lungs: Normal work of breathing. Neurologic: No focal deficits.   Lab Results  Component Value Date   CREATININE 0.64 06/06/2019     BUN 10 06/06/2019   NA 131 (L) 06/06/2019   K 4.5 06/06/2019   CL 97 (L) 06/06/2019   CO2 23 06/06/2019   Lab Results  Component Value Date   ALT 9 06/06/2019   AST 11 06/06/2019   ALKPHOS 108 02/07/2016   BILITOT 0.5 06/06/2019   Lab Results  Component Value Date   HGBA1C 12.6 (H) 06/06/2019   HGBA1C 11.1 (H) 10/17/2018   HGBA1C 13.2 (H) 09/14/2018   HGBA1C 13.4 (H) 03/07/2018   HGBA1C 12.2 (H) 10/05/2017   Lab Results  Component Value Date   TSH 2.100 07/18/2019   Lab Results  Component Value Date   CHOL 172 06/06/2019   HDL 37 (L) 06/06/2019   LDLCALC 109 (H) 06/06/2019   TRIG 145 06/06/2019   CHOLHDL 4.6 06/06/2019   Lab Results  Component Value Date   WBC 10.0 06/06/2019   HGB 13.0 06/06/2019   HCT 42.2 06/06/2019   MCV 81.5 06/06/2019   PLT 336 06/06/2019   Lab Results  Component Value Date   IRON 18 (L) 07/17/2013   Attestation Statements:   Reviewed by clinician on day of visit: allergies, medications, problem list, medical history, surgical history, family history, social history, and previous encounter notes.  Time spent on visit including pre-visit chart review and post-visit care and charting was 25 minutes.   I, Insurance claims handler, CMA, am acting as Energy manager for [employer], DO.  I have reviewed the above documentation for accuracy and completeness, and I agree with the above. Thomasene Lot, DO

## 2019-09-06 NOTE — Progress Notes (Signed)
  Office: [phone]  /  Fax: [phone]    Date: September 20, 2019   Appointment Start Time: 4:30pm Duration: 23 minutes Provider: Lawerance Cruel, Psy.D. 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 female presenting for a follow-up appointment to address the previously established treatment goal of increasing coping skills. Today's appointment was a telepsychological visit due to COVID-19. Samantha Lucas provided verbal consent for today's telepsychological appointment and she is aware she is responsible for securing confidentiality on her end of the session. Prior to proceeding with today's appointment, Samantha Lucas's physical location at the time of this appointment was obtained as well a phone number she could be reached at in the event of technical difficulties. Samantha Lucas and this provider participated in today's telepsychological service.   This provider conducted a brief check-in. Samantha Lucas reported a reduction in stress, noting mindfulness exercises have been helpful. She indicated she lost an additional three pounds. Samantha Lucas shared she will be going to vacation to [place]. She was encouraged to focus on hydration. Additionally, this provider and Samantha Lucas discussed strategies to help her stay on her meal plan while she is away. More specifically, psychoeducation regarding making better choices and engaging in portion control during the holidays/vacation was provided. More specifically, this provider discussed the following strategies: avoid skipping meals in anticipation of a holiday meal or eating out; avoid filling up on appetizers; managing portion sizes; checking the menu ahead of time; not completely depriving yourself; pacing yourself (e.g., waiting 10 minutes before going back for seconds, drinking water); and practicing mindfulness. Furthermore, termination  planning was discussed. Samantha Lucas was receptive to a follow-up appointment in 3-4 weeks and an additional follow-up/termination appointment in 3-4 weeks after that. Samantha Lucas was receptive to today's appointment as evidenced by openness to sharing, responsiveness to feedback, and willingness to implement discussed strategies.   Mental Status Examination:  Appearance: well groomed and appropriate hygiene  Behavior: appropriate to circumstances Mood: euthymic Affect: mood congruent Speech: normal in rate, volume, and tone Eye Contact: appropriate Psychomotor Activity: appropriate Gait: unable to assess Thought Process: linear, logical, and goal directed  Thought Content/Perception: no hallucinations, delusions, bizarre thinking or behavior reported or observed and no evidence of suicidal and homicidal ideation, plan, and intent Orientation: time, person, place, and purpose of appointment Memory/Concentration: memory, attention, language, and fund of knowledge intact  Insight/Judgment: good  Interventions:  Conducted a brief chart review Provided empathic reflections and validation Reviewed content from the previous session Employed supportive psychotherapy interventions to facilitate reduced distress and to improve coping skills with identified stressors  Discussed termination planning   DSM-5 Diagnosis(es): 307.59 (F50.8) Other Specified Feeding or Eating Disorder, Emotional Eating Behaviors and 300.00 (F41.9) Unspecified Anxiety Disorder  Treatment Goal & Progress: During the initial appointment with this provider, the following treatment goal was established: increase coping skills. Samantha Lucas has demonstrated progress in her goal as evidenced by increased awareness of hunger patterns and increased awareness of triggers for emotional eating. Samantha Lucas also demonstrates willingness to engage in mindfulness exercises and engage in learned strategies.  Plan: The next appointment will be scheduled in 3-4  weeks, which will be via MyChart Video Visit. The next session will focus on working towards the established treatment goal.

## 2019-09-18 ENCOUNTER — Other Ambulatory Visit: Payer: Self-pay

## 2019-09-18 ENCOUNTER — Encounter (INDEPENDENT_AMBULATORY_CARE_PROVIDER_SITE_OTHER): Payer: Self-pay | Admitting: Family Medicine

## 2019-09-18 ENCOUNTER — Ambulatory Visit (INDEPENDENT_AMBULATORY_CARE_PROVIDER_SITE_OTHER): Payer: No Typology Code available for payment source | Admitting: Family Medicine

## 2019-09-18 VITALS — BP 107/78 | HR 72 | Temp 98.7°F | Ht 69.0 in | Wt 243.0 lb

## 2019-09-18 DIAGNOSIS — Z794 Long term (current) use of insulin: Secondary | ICD-10-CM

## 2019-09-18 DIAGNOSIS — E1169 Type 2 diabetes mellitus with other specified complication: Secondary | ICD-10-CM

## 2019-09-18 DIAGNOSIS — Z6835 Body mass index (BMI) 35.0-35.9, adult: Secondary | ICD-10-CM

## 2019-09-18 DIAGNOSIS — Z9189 Other specified personal risk factors, not elsewhere classified: Secondary | ICD-10-CM | POA: Diagnosis not present

## 2019-09-18 DIAGNOSIS — E559 Vitamin D deficiency, unspecified: Secondary | ICD-10-CM

## 2019-09-18 NOTE — Progress Notes (Signed)
Chief Complaint:   OBESITY Samantha Lucas is here to discuss her progress with her obesity treatment plan along with follow-up of her obesity related diagnoses. Samantha Lucas is on the Category 2 Plan and states she is following her eating plan approximately 90% of the time. Samantha Lucas states she is doing Arts administrator for 30 minutes 4 times per week and getting 5,000 steps per day at work.  Today's visit was #: 5 Starting weight: 250 lbs Starting date: 07/18/2019 Today's weight: 243 lbs Today's date: 09/18/2019 Total lbs lost to date: 7 lbs Total lbs lost since last in-office visit: 3 lbs  Interim History: Samantha Lucas says she did better with mel prepping and also has been getting her exercise in more consistently.  She is using her snack calories on cheese or yogurt.  She is only occasionally having chips.  No difficulty with the meal plan.  She says her hunger and cravings are controlled.  She met with Dr. Mallie Mussel, and says it went well.  Subjective:   1. Type 2 diabetes mellitus with other specified complication, with long-term current use of insulin (HCC) Medications reviewed. Diabetic ROS: no polyuria or polydipsia, no chest pain, dyspnea or TIA's, no numbness, tingling or pain in extremities.  FBS 150s or so.  Occasional 130s.  She is seeing her PCP on September 8th.  She will get labs then and she prefers to have them done with her PCP (she is due).  She is on Trulicity, Engineer, agricultural, and glipizide.  Lab Results  Component Value Date   HGBA1C 12.6 (H) 06/06/2019   HGBA1C 11.1 (H) 10/17/2018   HGBA1C 13.2 (H) 09/14/2018   Lab Results  Component Value Date   MICROALBUR <0.2 06/06/2019   LDLCALC 109 (H) 06/06/2019   CREATININE 0.64 06/06/2019   2. Vitamin D deficiency Dymin's Vitamin D level was 22.4 on 07/18/2019. She is currently taking prescription vitamin D 50,000 IU each week. She denies nausea, vomiting or muscle weakness.  3. At risk for hypoglycemia Clementine is at increased risk for hypoglycemia  due to changes in diet, diagnosis of diabetes, and/or insulin use. Maria is currently taking insulin.   Assessment/Plan:   1. Type 2 diabetes mellitus with other specified complication, with long-term current use of insulin (HCC) Good blood sugar control is important to decrease the likelihood of diabetic complications such as nephropathy, neuropathy, limb loss, blindness, coronary artery disease, and death. Intensive lifestyle modification including diet, exercise and weight loss are the first line of treatment for diabetes.  Goal FBS is 120s or so regularly.  Okay to increase insulin by a couple units to gain better blood sugar control.  2. Vitamin D deficiency Low Vitamin D level contributes to fatigue and are associated with obesity, breast, and colon cancer. She agrees to continue to take prescription Vitamin D $RemoveB'@50'FPLcXqCj$ ,000 IU every week and will follow-up for routine testing of Vitamin D, at least 2-3 times per year to avoid over-replacement.  Continue prudent nutritional plan, weight loss.  3. At risk for hypoglycemia Oluwateniola was given approximately 15 minutes of counseling today regarding prevention of hypoglycemia. She was advised of symptoms of hypoglycemia. Janessa was instructed to avoid skipping meals, eat regular protein rich meals and schedule low calorie snacks as needed.   Repetitive spaced learning was employed today to elicit superior memory formation and behavioral change  4. Class 2 severe obesity with serious comorbidity and body mass index (BMI) of 35.0 to 35.9 in adult, unspecified obesity type (HCC) Samantha Lucas is  currently in the action stage of change. As such, her goal is to continue with weight loss efforts. She has agreed to the Category 2 Plan.   Exercise goals: For substantial health benefits, adults should do at least 150 minutes (2 hours and 30 minutes) a week of moderate-intensity, or 75 minutes (1 hour and 15 minutes) a week of vigorous-intensity aerobic physical activity, or  an equivalent combination of moderate- and vigorous-intensity aerobic activity. Aerobic activity should be performed in episodes of at least 10 minutes, and preferably, it should be spread throughout the week.  Behavioral modification strategies: increasing lean protein intake, decreasing simple carbohydrates, increasing water intake, no skipping meals, meal planning and cooking strategies and planning for success.  Thressa has agreed to follow-up with our clinic in 2 weeks. She was informed of the importance of frequent follow-up visits to maximize her success with intensive lifestyle modifications for her multiple health conditions.   Objective:   Blood pressure 107/78, pulse 72, temperature 98.7 F (37.1 C), height $RemoveBe'5\' 9"'rMQAEXAGZ$  (1.753 m), weight 243 lb (110.2 kg), SpO2 100 %. Body mass index is 35.88 kg/m.  General: Cooperative, alert, well developed, in no acute distress. HEENT: Conjunctivae and lids unremarkable. Cardiovascular: Regular rhythm.  Lungs: Normal work of breathing. Neurologic: No focal deficits.   Lab Results  Component Value Date   CREATININE 0.64 06/06/2019   BUN 10 06/06/2019   NA 131 (L) 06/06/2019   K 4.5 06/06/2019   CL 97 (L) 06/06/2019   CO2 23 06/06/2019   Lab Results  Component Value Date   ALT 9 06/06/2019   AST 11 06/06/2019   ALKPHOS 108 02/07/2016   BILITOT 0.5 06/06/2019   Lab Results  Component Value Date   HGBA1C 12.6 (H) 06/06/2019   HGBA1C 11.1 (H) 10/17/2018   HGBA1C 13.2 (H) 09/14/2018   HGBA1C 13.4 (H) 03/07/2018   HGBA1C 12.2 (H) 10/05/2017   Lab Results  Component Value Date   TSH 2.100 07/18/2019   Lab Results  Component Value Date   CHOL 172 06/06/2019   HDL 37 (L) 06/06/2019   LDLCALC 109 (H) 06/06/2019   TRIG 145 06/06/2019   CHOLHDL 4.6 06/06/2019   Lab Results  Component Value Date   WBC 10.0 06/06/2019   HGB 13.0 06/06/2019   HCT 42.2 06/06/2019   MCV 81.5 06/06/2019   PLT 336 06/06/2019   Lab Results  Component  Value Date   IRON 18 (L) 07/17/2013   Attestation Statements:   Reviewed by clinician on day of visit: allergies, medications, problem list, medical history, surgical history, family history, social history, and previous encounter notes.  I, Water quality scientist, CMA, am acting as Location manager for [employer], DO.  I have reviewed the above documentation for accuracy and completeness, and I agree with the above. Mellody Dance, DO

## 2019-09-20 ENCOUNTER — Other Ambulatory Visit: Payer: Self-pay

## 2019-09-20 ENCOUNTER — Telehealth (INDEPENDENT_AMBULATORY_CARE_PROVIDER_SITE_OTHER): Payer: No Typology Code available for payment source | Admitting: Psychology

## 2019-09-20 DIAGNOSIS — F419 Anxiety disorder, unspecified: Secondary | ICD-10-CM | POA: Diagnosis not present

## 2019-09-20 DIAGNOSIS — F5089 Other specified eating disorder: Secondary | ICD-10-CM | POA: Diagnosis not present

## 2019-09-27 ENCOUNTER — Other Ambulatory Visit: Payer: Self-pay | Admitting: Family Medicine

## 2019-10-03 NOTE — Progress Notes (Signed)
  Office: [phone]  /  Fax: [phone]    Date: October 16, 2019   Appointment Start Time: 4:30pm Duration: 23 minutes Provider: Lawerance Lucas, Psy.D. 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 female presenting for a follow-up appointment to address the previously established treatment goal of increasing coping skills. Today's appointment was a telepsychological visit due to COVID-19. Samantha Lucas provided verbal consent for today's telepsychological appointment and she is aware she is responsible for securing confidentiality on her end of the session. Prior to proceeding with today's appointment, Samantha Lucas's physical location at the time of this appointment was obtained as well a phone number she could be reached at in the event of technical difficulties. Samantha Lucas and this provider participated in today's telepsychological service.   This provider conducted a brief check-in. Samantha Lucas shared about her recent events, including her vacation. She acknowledged she gained three pounds, noting she engaged in emotional eating after her trip due to stressors. She added, "I know what I need to do to get back on track." Samantha Lucas and this provider discussed the dieting mentality as well as all or nothing thinking. Notably, Samantha Lucas reported an improvement in all labs. Session focused further on mindfulness to assist with coping. She acknowledged she did not engage in any exercises since the last appointment. Samantha Lucas was led through a mindfulness exercise (urge surfing) and her experience was processed. Samantha Lucas provided verbal consent during today's appointment for this provider to send the handout for today's exercise via e-mail. This provider also discussed the utilization of YouTube for mindfulness exercises (e.g., exercises by Rhae Hammock). Samantha Lucas was receptive to  today's appointment as evidenced by openness to sharing, responsiveness to feedback, and willingness to continue engaging in mindfulness exercises.  Mental Status Examination:  Appearance: well groomed and appropriate hygiene  Behavior: appropriate to circumstances Mood: euthymic Affect: mood congruent Speech: normal in rate, volume, and tone Eye Contact: appropriate Psychomotor Activity: appropriate Gait: unable to assess Thought Process: linear, logical, and goal directed  Thought Content/Perception: no hallucinations, delusions, bizarre thinking or behavior reported or observed and no evidence of suicidal and homicidal ideation, plan, and intent Orientation: time, person, place, and purpose of appointment Memory/Concentration: memory, attention, language, and fund of knowledge intact  Insight/Judgment: fair  Interventions:  Conducted a brief chart review Provided empathic reflections and validation Reviewed content from the previous session Employed supportive psychotherapy interventions to facilitate reduced distress and to improve coping skills with identified stressors Engaged patient in mindfulness exercise(s) Employed acceptance and commitment interventions to emphasize mindfulness and acceptance without struggle   DSM-5 Diagnosis(es): 307.59 (F50.8) Other Specified Feeding or Eating Disorder, Emotional Eating Behaviors and 300.00 (F41.9) Unspecified Anxiety Disorder  Treatment Goal & Progress: During the initial appointment with this provider, the following treatment goal was established: increase coping skills. Samantha Lucas has demonstrated progress in her goal as evidenced by increased awareness of hunger patterns and increased awareness of triggers for emotional eating. Samantha Lucas also continues to demonstrate willingness to engage in learned skill(s).  Plan: Due to recent deviations, the next appointment will be scheduled in 2-3 weeks, which will be via MyChart Video Visit. The next  session will focus on working towards the established treatment goal.

## 2019-10-04 ENCOUNTER — Ambulatory Visit (INDEPENDENT_AMBULATORY_CARE_PROVIDER_SITE_OTHER): Payer: No Typology Code available for payment source | Admitting: Family Medicine

## 2019-10-04 ENCOUNTER — Other Ambulatory Visit: Payer: Self-pay

## 2019-10-04 ENCOUNTER — Encounter (INDEPENDENT_AMBULATORY_CARE_PROVIDER_SITE_OTHER): Payer: Self-pay

## 2019-10-04 ENCOUNTER — Encounter: Payer: Self-pay | Admitting: Family Medicine

## 2019-10-04 ENCOUNTER — Other Ambulatory Visit: Payer: Self-pay | Admitting: Family Medicine

## 2019-10-04 ENCOUNTER — Telehealth: Payer: Self-pay | Admitting: *Deleted

## 2019-10-04 VITALS — BP 124/68 | HR 72 | Temp 97.8°F | Resp 14 | Ht 69.0 in | Wt 249.0 lb

## 2019-10-04 DIAGNOSIS — L680 Hirsutism: Secondary | ICD-10-CM | POA: Diagnosis not present

## 2019-10-04 DIAGNOSIS — Z794 Long term (current) use of insulin: Secondary | ICD-10-CM

## 2019-10-04 DIAGNOSIS — Z23 Encounter for immunization: Secondary | ICD-10-CM

## 2019-10-04 DIAGNOSIS — E669 Obesity, unspecified: Secondary | ICD-10-CM | POA: Diagnosis not present

## 2019-10-04 DIAGNOSIS — E559 Vitamin D deficiency, unspecified: Secondary | ICD-10-CM

## 2019-10-04 DIAGNOSIS — E1165 Type 2 diabetes mellitus with hyperglycemia: Secondary | ICD-10-CM

## 2019-10-04 MED ORDER — BD PEN NEEDLE NANO U/F 32G X 4 MM MISC: 1 refills | Status: DC

## 2019-10-04 MED ORDER — VITAMIN D (ERGOCALCIFEROL) 1.25 MG (50000 UNIT) PO CAPS: 50000.0000 [IU] | ORAL_CAPSULE | ORAL | 0 refills | Status: DC

## 2019-10-04 NOTE — Assessment & Plan Note (Signed)
Repeat A1C goal < 7% She is following with weight loss clinic but as been off meal plan recently  Weight down 5lbs since our visit in May  contiue current regimen Goal is to get off insulin therapy if possible There is concern for probable underlying pcos She has insulin resistance and hirsutism cycles dont meet criteria, however  Will check testosterone for the hyperandrogen link and likley obtain US of ovaries  Unfortunately she did not tolerate Metformin

## 2019-10-04 NOTE — Telephone Encounter (Signed)
-----   Message from Salley Scarlet, MD sent at 10/04/2019 11:51 AM EDT ----- Regarding: Needs her nano needles for insulin sent to [facility]

## 2019-10-04 NOTE — Patient Instructions (Signed)
F/u 4 months 

## 2019-10-04 NOTE — Telephone Encounter (Signed)
Prescription sent to pharmacy.

## 2019-10-04 NOTE — Progress Notes (Signed)
° °  Subjective:    Patient ID: Samantha Lucas, female    DOB: 02-22-1990, 29 y.o.   MRN: [id number]  Patient presents for Follow-up (is not fasting)  Pt here to f/u chronic medical problems  Medications reviewed SHe is following with [facility]  She has gained some weight recently. She has been stressed with work, not exercising as much and eating her emotions  Vitamin D def- she is taking prescription strength , NEEDS refilled  She has hair growth on chin, this runs in family, occ cycle is off, mostly when stressed, has insulin resistance with DM, concerned she may have PCOS     DM-Last A1C 12.6%  Trulicity 1.5mg  weekly, Glipizide 5mg  at dinner  Taking Basaglar 10 units   Fasting on average  140's      Review Of Systems:  GEN- denies fatigue, fever, weight loss,weakness, recent illness HEENT- denies eye drainage, change in vision, nasal discharge, CVS- denies chest pain, palpitations RESP- denies SOB, cough, wheeze ABD- denies N/V, change in stools, abd pain GU- denies dysuria, hematuria, dribbling, incontinence MSK- denies joint pain, muscle aches, injury Neuro- denies headache, dizziness, syncope, seizure activity       Objective:    BP 124/68    Pulse 72    Temp 97.8 F (36.6 C) (Temporal)    Resp 14    Ht 5\' 9"  (1.753 m)    Wt 249 lb (112.9 kg)    SpO2 98%    BMI 36.77 kg/m  GEN- NAD, alert and oriented x3 HEENT- PERRL, EOMI, non injected sclera, pink conjunctiva, MMM, oropharynx clear Neck- Supple, no thyromegaly CVS- RRR, no murmur RESP-CTAB ABD-NABS,soft,NT,ND EXT-varicose veins bilat mild ankle edema Pulses- Radial, DP- 2+        Assessment & Plan:      Problem List Items Addressed This Visit      Unprioritized   Class 2 obesity   Type 2 diabetes mellitus with hyperglycemia (HCC)    Repeat A1C goal < 7% She is following with weight loss clinic but as been off meal plan recently  Weight down 5lbs since our visit in May  contiue  current regimen Goal is to get off insulin therapy if possible There is concern for probable underlying pcos She has insulin resistance and hirsutism cycles dont meet criteria, however  Will check testosterone for the hyperandrogen link and likley obtain of ovaries  Unfortunately she did not tolerate Metformin       Relevant Orders   CBC with Differential/Platelet   Comprehensive metabolic panel   Hemoglobin A1c   Lipid panel    Other Visit Diagnoses    Hirsutism    -  Primary   Relevant Orders   CBC with Differential/Platelet   Comprehensive metabolic panel   Testosterone   DHEA   Need for immunization against influenza       Relevant Orders   Flu Vaccine QUAD 36+ mos IM (Completed)   Vitamin D deficiency       Relevant Medications   Vitamin D, Ergocalciferol, (DRISDOL) 1.25 MG (50000 UNIT) CAPS capsule      Note: This dictation was prepared with Dragon dictation along with smaller phrase technology. Any transcriptional errors that result from this process are unintentional.

## 2019-10-05 LAB — TESTOSTERONE: Testosterone: 20 ng/dL

## 2019-10-08 LAB — CBC WITH DIFFERENTIAL/PLATELET
Absolute Monocytes: 646 cells/uL (ref 200–950)
Basophils Absolute: 29 cells/uL (ref 0–200)
Basophils Relative: 0.3 %
Eosinophils Absolute: 152 cells/uL (ref 15–500)
Eosinophils Relative: 1.6 %
HCT: 41.2 % (ref 35.0–45.0)
Hemoglobin: 13 g/dL (ref 11.7–15.5)
Lymphs Abs: 3211 cells/uL (ref 850–3900)
MCH: 25.6 pg — ABNORMAL LOW (ref 27.0–33.0)
MCHC: 31.6 g/dL — ABNORMAL LOW (ref 32.0–36.0)
MCV: 81.1 fL (ref 80.0–100.0)
MPV: 10.1 fL (ref 7.5–12.5)
Monocytes Relative: 6.8 %
Neutro Abs: 5463 cells/uL (ref 1500–7800)
Neutrophils Relative %: 57.5 %
Platelets: 369 10*3/uL (ref 140–400)
RBC: 5.08 10*6/uL (ref 3.80–5.10)
RDW: 14.2 % (ref 11.0–15.0)
Total Lymphocyte: 33.8 %
WBC: 9.5 10*3/uL (ref 3.8–10.8)

## 2019-10-08 LAB — COMPREHENSIVE METABOLIC PANEL
AG Ratio: 1.1 (calc) (ref 1.0–2.5)
ALT: 12 U/L (ref 6–29)
AST: 14 U/L (ref 10–30)
Albumin: 4 g/dL (ref 3.6–5.1)
Alkaline phosphatase (APISO): 127 U/L — ABNORMAL HIGH (ref 31–125)
BUN: 12 mg/dL (ref 7–25)
CO2: 29 mmol/L (ref 20–32)
Calcium: 9.7 mg/dL (ref 8.6–10.2)
Chloride: 101 mmol/L (ref 98–110)
Creat: 0.6 mg/dL (ref 0.50–1.10)
Globulin: 3.5 g/dL (calc) (ref 1.9–3.7)
Glucose, Bld: 182 mg/dL — ABNORMAL HIGH (ref 65–99)
Potassium: 4.6 mmol/L (ref 3.5–5.3)
Sodium: 136 mmol/L (ref 135–146)
Total Bilirubin: 0.4 mg/dL (ref 0.2–1.2)
Total Protein: 7.5 g/dL (ref 6.1–8.1)

## 2019-10-08 LAB — LIPID PANEL
Cholesterol: 159 mg/dL (ref <200)
HDL: 44 mg/dL — ABNORMAL LOW (ref >=50)
LDL Cholesterol (Calc): 99 mg/dL (calc)
Non-HDL Cholesterol (Calc): 115 mg/dL (calc) (ref <130)
Total CHOL/HDL Ratio: 3.6 (calc) (ref <5.0)
Triglycerides: 70 mg/dL (ref <150)

## 2019-10-08 LAB — HEMOGLOBIN A1C
Hgb A1c MFr Bld: 8.4 % of total Hgb — ABNORMAL HIGH (ref <5.7)
Mean Plasma Glucose: 194 (calc)
eAG (mmol/L): 10.8 (calc)

## 2019-10-08 LAB — DHEA: DHEA: 487 ng/dL

## 2019-10-16 ENCOUNTER — Encounter (INDEPENDENT_AMBULATORY_CARE_PROVIDER_SITE_OTHER): Payer: Self-pay | Admitting: Family Medicine

## 2019-10-16 ENCOUNTER — Other Ambulatory Visit (INDEPENDENT_AMBULATORY_CARE_PROVIDER_SITE_OTHER): Payer: Self-pay | Admitting: Family Medicine

## 2019-10-16 ENCOUNTER — Ambulatory Visit (INDEPENDENT_AMBULATORY_CARE_PROVIDER_SITE_OTHER): Payer: No Typology Code available for payment source | Admitting: Family Medicine

## 2019-10-16 ENCOUNTER — Telehealth (INDEPENDENT_AMBULATORY_CARE_PROVIDER_SITE_OTHER): Payer: No Typology Code available for payment source | Admitting: Psychology

## 2019-10-16 ENCOUNTER — Other Ambulatory Visit: Payer: Self-pay

## 2019-10-16 VITALS — BP 103/69 | HR 67 | Temp 98.1°F | Ht 69.0 in | Wt 246.0 lb

## 2019-10-16 DIAGNOSIS — Z6836 Body mass index (BMI) 36.0-36.9, adult: Secondary | ICD-10-CM

## 2019-10-16 DIAGNOSIS — E559 Vitamin D deficiency, unspecified: Secondary | ICD-10-CM | POA: Diagnosis not present

## 2019-10-16 DIAGNOSIS — F5089 Other specified eating disorder: Secondary | ICD-10-CM | POA: Diagnosis not present

## 2019-10-16 DIAGNOSIS — F419 Anxiety disorder, unspecified: Secondary | ICD-10-CM | POA: Diagnosis not present

## 2019-10-16 DIAGNOSIS — E1165 Type 2 diabetes mellitus with hyperglycemia: Secondary | ICD-10-CM | POA: Diagnosis not present

## 2019-10-16 DIAGNOSIS — Z794 Long term (current) use of insulin: Secondary | ICD-10-CM

## 2019-10-16 DIAGNOSIS — Z9189 Other specified personal risk factors, not elsewhere classified: Secondary | ICD-10-CM | POA: Diagnosis not present

## 2019-10-16 DIAGNOSIS — E66812 Obesity, class 2: Secondary | ICD-10-CM

## 2019-10-16 MED ORDER — VITAMIN D (ERGOCALCIFEROL) 1.25 MG (50000 UNIT) PO CAPS: 50000.0000 [IU] | ORAL_CAPSULE | ORAL | 0 refills | Status: DC

## 2019-10-16 MED ORDER — TRULICITY 3 MG/0.5ML ~~LOC~~ SOAJ: 3.0000 mg | SUBCUTANEOUS | 0 refills | Status: DC

## 2019-10-18 NOTE — Progress Notes (Signed)
Office: [phone]  /  Fax: [phone]    Date: November 01, 2019   Appointment Start Time: 2:52pm Duration: 25 minutes Provider: Lawerance Cruel, Psy.D. Type of Session: Individual Therapy  Location of Patient: Home Location of Provider: Provider's Home Type of Contact: Telepsychological Visit via MyChart Video Visit  Session Content: Samantha Lucas is a 29 year old female presenting for a follow-up appointment to address the previously established treatment goal of increasing coping skills. Today's appointment was a telepsychological visit due to COVID-19. [name] provided verbal consent for today's telepsychological appointment and she is aware she is responsible for securing confidentiality on her end of the session. Prior to proceeding with today's appointment, [name]'s physical location at the time of this appointment was obtained as well a phone number she could be reached at in the event of technical difficulties. [name] and this provider participated in today's telepsychological service.   This provider conducted a brief check-in. [name] reported a decrease in work stress. She also discussed she started journaling "why" she is focusing on her health and weight loss, which she described enjoying. Additionally, she stated she is focusing on her eating habits and physical activity. Session focused further on mindfulness to assist with coping. She discussed engaging in shared exercises. [name] was led through a mindfulness exercise (A Taste of Mindfulness) and her experience was processed. [name] provided verbal consent during today's appointment for this provider to send the handout for today's exercise via e-mail. Notably, this provider discussed her upcoming maternity leave toward the end of November. [name] acknowledged understanding given the uncertain nature of the circumstances, this provider may be out of the office sooner. This provider and [name] discussed referral options and verbal consent was  provided for this provider to send a list of referral options via e-mail. All questions/concerns were addressed. [name] denied any concerns. Overall, [name] was receptive to today's appointment as evidenced by openness to sharing, responsiveness to feedback, and willingness to continue engaging in mindfulness exercises.  Mental Status Examination:  Appearance: well groomed and appropriate hygiene  Behavior: appropriate to circumstances Mood: euthymic Affect: mood congruent Speech: normal in rate, volume, and tone Eye Contact: appropriate Psychomotor Activity: appropriate Gait: unable to assess Thought Process: linear, logical, and goal directed  Thought Content/Perception: no hallucinations, delusions, bizarre thinking or behavior reported or observed and no evidence of suicidal and homicidal ideation, plan, and intent Orientation: time, person, place, and purpose of appointment Memory/Concentration: memory, attention, language, and fund of knowledge intact  Insight/Judgment: good  Interventions:  Conducted a brief chart review Provided empathic reflections and validation Employed supportive psychotherapy interventions to facilitate reduced distress and to improve coping skills with identified stressors Engaged patient in mindfulness exercise(s) Employed acceptance and commitment interventions to emphasize mindfulness and acceptance without struggle  DSM-5 Diagnosis(es): 307.59 (F50.8) Other Specified Feeding or Eating Disorder, Emotional Eating Behaviors and 300.00 (F41.9) Unspecified Anxiety Disorder  Treatment Goal & Progress: During the initial appointment with this provider, the following treatment goal was established: increase coping skills. [name] has demonstrated progress in her goal as evidenced by increased awareness of hunger patterns and increased awareness of triggers for emotional eating. [name] also continues to demonstrate willingness to engage in learned skill(s).  Plan:  Brenae shared she is getting back on track. As such, an additional follow-up appointment was recommended. The next appointment will be scheduled in 3-4 weeks, which will be via MyChart Video Visit. The next session will focus on working towards the established treatment goal and termination. Additionally, [name]  reported she will be initiating individual therapeutic services this Friday to address family dynamics with [name], LCSW.

## 2019-10-18 NOTE — Progress Notes (Signed)
Chief Complaint:   OBESITY Samantha Lucas is here to discuss her progress with her obesity treatment plan along with follow-up of her obesity related diagnoses. Samantha Lucas is on the Category 2 Plan and states she is following her eating plan approximately 85% of the time. Samantha Lucas states she is doing Hospital doctor for 15-30 minutes 3 times per week.  Today's visit was #: 6 Starting weight: 250 lbs Starting date: 07/18/2019 Today's weight: 246 lbs Today's date: 10/16/2019 Total lbs lost to date: 4 lbs Total lbs lost since last in-office visit: 0  Interim History: Samantha Lucas says, "I didn't do well".  "My workouts fell short and I did not meal plan."  Thus, she says she ate quick foods/did more emotional eating.  She missed her last office visit and it has almost been 1 month since she was last seen.  Subjective:   1. Type 2 diabetes mellitus with hyperglycemia, with long-term current use of insulin (HCC) Medications reviewed. Diabetic ROS: no polyuria or polydipsia, no chest pain, dyspnea or TIA's, no numbness, tingling or pain in extremities.  A1c went from 12.6 to 8.4.  She says this is the best it has been in years.  She is taking lisinopril, Trulicity, glipizide, and Basaglar 10 units at night.  Lab Results  Component Value Date   HGBA1C 8.4 (H) 10/04/2019   HGBA1C 12.6 (H) 06/06/2019   HGBA1C 11.1 (H) 10/17/2018   Lab Results  Component Value Date   MICROALBUR <0.2 06/06/2019   LDLCALC 99 10/04/2019   CREATININE 0.60 10/04/2019   2. Vitamin D deficiency Samantha Lucas Vitamin D level was 22.4 on 07/18/2019. She is currently taking prescription vitamin D 50,000 IU each week. She denies nausea, vomiting or muscle weakness.  She has been taking vitamin D consistently for a little less than 3 months.  Tolerating well.  3. At risk for hypoglycemia Samantha Lucas is at increased risk for hypoglycemia due to changes in diet, diagnosis of diabetes, and/or insulin use. Samantha Lucas is currently taking insulin.    Assessment/Plan:   1. Type 2 diabetes mellitus with hyperglycemia, with long-term current use of insulin (HCC) Discussed labs with patient today.  Good blood sugar control is important to decrease the likelihood of diabetic complications such as nephropathy, neuropathy, limb loss, blindness, coronary artery disease, and death. Intensive lifestyle modification including diet, exercise and weight loss are the first line of treatment for diabetes.  Increase Trulicity dose from 1.5 to 3 mg, as per below.  -Increase Dulaglutide (TRULICITY) 3 MG/0.5ML SOPN; Inject 3 mg as directed once a week.  Dispense: 2 mL; Refill: 0  2. Vitamin D deficiency Low Vitamin D level contributes to fatigue and are associated with obesity, breast, and colon cancer. She agrees to continue to take prescription Vitamin D @50 ,000 IU every week and will follow-up for routine testing of Vitamin D, at least 2-3 times per year to avoid over-replacement.  Recheck vitamin D level at next office visit.  Not done with recent labs.  Continue weight loss.  -Refill Vitamin D, Ergocalciferol, (DRISDOL) 1.25 MG (50000 UNIT) CAPS capsule; Take 1 capsule (50,000 Units total) by mouth every 7 (seven) days.  Dispense: 4 capsule; Refill: 0  3. At risk for hypoglycemia Samantha Lucas was given approximately 15 minutes of counseling today regarding prevention of hypoglycemia. She was advised of symptoms of hypoglycemia. Samantha Lucas was instructed to avoid skipping meals, eat regular protein rich meals and schedule low calorie snacks as needed.   4. Class 2 severe obesity with  serious comorbidity and body mass index (BMI) of 36.0 to 36.9 in adult, unspecified obesity type (HCC) Samantha Lucas is currently in the action stage of change. As such, her goal is to continue with weight loss efforts. She has agreed to the Category 2 Plan but gave breakfast options as well.   Exercise goals: As is.  Behavioral modification strategies: increasing lean protein intake,  increasing water intake, meal planning and cooking strategies and planning for success.  Samantha Lucas has agreed to follow-up with our clinic in 2 weeks. She was informed of the importance of frequent follow-up visits to maximize her success with intensive lifestyle modifications for her multiple health conditions.   Objective:   Blood pressure 103/69, pulse 67, temperature 98.1 F (36.7 C), height 5\' 9"  (1.753 m), weight 246 lb (111.6 kg), SpO2 100 %. Body mass index is 36.33 kg/m.  General: Cooperative, alert, well developed, in no acute distress. HEENT: Conjunctivae and lids unremarkable. Cardiovascular: Regular rhythm.  Lungs: Normal work of breathing. Neurologic: No focal deficits.   Lab Results  Component Value Date   CREATININE 0.60 10/04/2019   BUN 12 10/04/2019   NA 136 10/04/2019   K 4.6 10/04/2019   CL 101 10/04/2019   CO2 29 10/04/2019   Lab Results  Component Value Date   ALT 12 10/04/2019   AST 14 10/04/2019   ALKPHOS 108 02/07/2016   BILITOT 0.4 10/04/2019   Lab Results  Component Value Date   HGBA1C 8.4 (H) 10/04/2019   HGBA1C 12.6 (H) 06/06/2019   HGBA1C 11.1 (H) 10/17/2018   HGBA1C 13.2 (H) 09/14/2018   HGBA1C 13.4 (H) 03/07/2018   Lab Results  Component Value Date   TSH 2.100 07/18/2019   Lab Results  Component Value Date   CHOL 159 10/04/2019   HDL 44 (L) 10/04/2019   LDLCALC 99 10/04/2019   TRIG 70 10/04/2019   CHOLHDL 3.6 10/04/2019   Lab Results  Component Value Date   WBC 9.5 10/04/2019   HGB 13.0 10/04/2019   HCT 41.2 10/04/2019   MCV 81.1 10/04/2019   PLT 369 10/04/2019   Lab Results  Component Value Date   IRON 18 (L) 07/17/2013   Attestation Statements:   Reviewed by clinician on day of visit: allergies, medications, problem list, medical history, surgical history, family history, social history, and previous encounter notes.  I, 07/19/2013, CMA, am acting as Insurance claims handler for Energy manager, DO.  I have reviewed the  above documentation for accuracy and completeness, and I agree with the above. -  [employer], DO

## 2019-10-30 ENCOUNTER — Ambulatory Visit (INDEPENDENT_AMBULATORY_CARE_PROVIDER_SITE_OTHER): Payer: No Typology Code available for payment source | Admitting: Family Medicine

## 2019-10-30 ENCOUNTER — Other Ambulatory Visit: Payer: Self-pay

## 2019-10-30 ENCOUNTER — Encounter (INDEPENDENT_AMBULATORY_CARE_PROVIDER_SITE_OTHER): Payer: Self-pay | Admitting: Family Medicine

## 2019-10-30 VITALS — BP 115/74 | HR 71 | Temp 98.6°F | Ht 69.0 in | Wt 245.0 lb

## 2019-10-30 DIAGNOSIS — E559 Vitamin D deficiency, unspecified: Secondary | ICD-10-CM

## 2019-10-30 DIAGNOSIS — E1165 Type 2 diabetes mellitus with hyperglycemia: Secondary | ICD-10-CM

## 2019-10-30 DIAGNOSIS — Z794 Long term (current) use of insulin: Secondary | ICD-10-CM

## 2019-10-30 DIAGNOSIS — Z9189 Other specified personal risk factors, not elsewhere classified: Secondary | ICD-10-CM

## 2019-10-30 DIAGNOSIS — Z6836 Body mass index (BMI) 36.0-36.9, adult: Secondary | ICD-10-CM

## 2019-10-30 NOTE — Progress Notes (Signed)
Chief Complaint:   OBESITY Samantha Lucas is here to discuss her progress with her obesity treatment plan along with follow-up of her obesity related diagnoses. Samantha Lucas is on the Category 2 Plan and states she is following her eating plan approximately 90% of the time. Samantha Lucas states she is doing Hospital doctor for 15-30 minutes 3 times per week.  Today's visit was #: 7 Starting weight: 250 lbs Starting date: 07/18/2019 Today's weight: 245 lbs Today's date: 10/30/2019 Total lbs lost to date: 5 lbs Total lbs lost since last in-office visit: 1 lb  Interim History:  Samantha Lucas says she is doing a lot better with much less emotional eating than prior.   However, she fell off her exercise routine.   Not skipping meals.  She is following the plan better.  She likes it!   Hunger and cravings are controlled.  She says she can follow the plan easily and it works when she follows it.  Tells me she just needs to do it more consistently  Subjective:    1. Type 2 diabetes mellitus with hyperglycemia, with long-term current use of insulin (HCC) Medications reviewed.   FBS averaging around 140s.  No lows at all per pt.    Last office visit, we increased Trulicity to 3 mg, but she has not started the new dose yet.    She will next week.  She was diagnosed with diabetes around 2014.  She has questions about what type of DM she has and is currently being given medications for this condition by PCP.  Lab Results  Component Value Date   HGBA1C 8.4 (H) 10/04/2019   HGBA1C 12.6 (H) 06/06/2019   HGBA1C 11.1 (H) 10/17/2018   Lab Results  Component Value Date   MICROALBUR <0.2 06/06/2019   LDLCALC 99 10/04/2019   CREATININE 0.60 10/04/2019    2. Vitamin D deficiency Samantha Lucas's Vitamin D level was 22.4 on 07/18/2019. She is currently taking prescription vitamin D 50,000 IU each week. She denies nausea, vomiting or muscle weakness.  She has been consistent with weekly vitamin D since the end of June/July.  Tolerating  well without side effects.  She also takes a daily multivitamin.   3. At risk for hypoglycemia Samantha Lucas is at increased risk for hypoglycemia due to changes in diet, diagnosis of diabetes, and insulin use. Samantha Lucas is currently taking insulin.     Assessment/Plan:   1. Type 2 diabetes mellitus with hyperglycemia, with long-term current use of insulin (HCC) Good blood sugar control is important to decrease the likelihood of diabetic complications such as nephropathy, neuropathy, limb loss, blindness, coronary artery disease, and death. Intensive lifestyle modification including diet, exercise and weight loss are the first line of treatment for diabetes.  Continue medications as written.  Continue weight loss and increase Trulicity as planned.  Keep extra vigilant with blood sugars and call with concerns.  Close follow-up planned.   2. Vitamin D deficiency Low Vitamin D level contributes to fatigue and are associated with obesity, breast, and colon cancer. She agrees to continue to take prescription Vitamin D @50 ,000 IU every week and will follow-up for routine testing of Vitamin D, at least 2-3 times per year to avoid over-replacement.    Recheck vitamin D level today.    Orders Placed This Encounter  Procedures  . VITAMIN D 25 Hydroxy (Vit-D Deficiency, Fractures)    3. At risk for hypoglycemia Samantha Lucas was given approximately 9 minutes of counseling today regarding prevention of hypoglycemia.  She was advised of symptoms of hypoglycemia.  Samantha Lucas was instructed to avoid skipping meals, and to eat regular protein-rich meals as prescribed on the meal plan.  Have readily available- low calorie snacks as needed ie- [employer] fruit snack packs if BS goes too low.    4. Class 2 severe obesity with serious comorbidity and body mass index (BMI) of 36.0 to 36.9 in adult, unspecified obesity type (HCC)  Samantha Lucas is currently in the action stage of change. As such, her goal is to continue with weight loss  efforts. She has agreed to the Category 2 Plan.   Exercise goals: As is.  Behavioral modification strategies: increasing lean protein intake and increasing water intake.  Patient will start journaling daily about her "why", things she is proud of that day.  Focus on positives.  Samantha Lucas has agreed to follow-up with our clinic in 2 weeks. She was informed of the importance of frequent follow-up visits to maximize her success with intensive lifestyle modifications for her multiple health conditions.   Samantha Lucas was informed we would discuss her lab results at her next visit unless there is a critical issue that needs to be addressed sooner. Samantha Lucas agreed to keep her next visit at the agreed upon time to discuss these results.  Objective:   Blood pressure 115/74, pulse 71, temperature 98.6 F (37 C), height 5\' 9"  (1.753 m), weight 245 lb (111.1 kg), SpO2 98 %. Body mass index is 36.18 kg/m.  General: Cooperative, alert, well developed, in no acute distress. HEENT: Conjunctivae and lids unremarkable. Cardiovascular: Regular rhythm.  Lungs: Normal work of breathing. Neurologic: No focal deficits.   Lab Results  Component Value Date   CREATININE 0.60 10/04/2019   BUN 12 10/04/2019   NA 136 10/04/2019   K 4.6 10/04/2019   CL 101 10/04/2019   CO2 29 10/04/2019   Lab Results  Component Value Date   ALT 12 10/04/2019   AST 14 10/04/2019   ALKPHOS 108 02/07/2016   BILITOT 0.4 10/04/2019   Lab Results  Component Value Date   HGBA1C 8.4 (H) 10/04/2019   HGBA1C 12.6 (H) 06/06/2019   HGBA1C 11.1 (H) 10/17/2018   HGBA1C 13.2 (H) 09/14/2018   HGBA1C 13.4 (H) 03/07/2018   Lab Results  Component Value Date   TSH 2.100 07/18/2019   Lab Results  Component Value Date   CHOL 159 10/04/2019   HDL 44 (L) 10/04/2019   LDLCALC 99 10/04/2019   TRIG 70 10/04/2019   CHOLHDL 3.6 10/04/2019   Lab Results  Component Value Date   WBC 9.5 10/04/2019   HGB 13.0 10/04/2019   HCT 41.2 10/04/2019     MCV 81.1 10/04/2019   PLT 369 10/04/2019   Lab Results  Component Value Date   IRON 18 (L) 07/17/2013   Attestation Statements:   Reviewed by clinician on day of visit: allergies, medications, problem list, medical history, surgical history, family history, social history, and previous encounter notes.  I, 07/19/2013, CMA, am acting as Insurance claims handler for Energy manager, DO.  I have reviewed the above documentation for accuracy and completeness, and I agree with the above. [employer], DO

## 2019-10-31 LAB — VITAMIN D 25 HYDROXY (VIT D DEFICIENCY, FRACTURES): Vit D, 25-Hydroxy: 47.6 ng/mL (ref 30.0–100.0)

## 2019-11-01 ENCOUNTER — Telehealth (INDEPENDENT_AMBULATORY_CARE_PROVIDER_SITE_OTHER): Payer: No Typology Code available for payment source | Admitting: Psychology

## 2019-11-01 DIAGNOSIS — F419 Anxiety disorder, unspecified: Secondary | ICD-10-CM | POA: Diagnosis not present

## 2019-11-01 DIAGNOSIS — F5089 Other specified eating disorder: Secondary | ICD-10-CM | POA: Diagnosis not present

## 2019-11-08 NOTE — Progress Notes (Signed)
Office: [phone]  /  Fax: [phone]    Date: November 22, 2019   Appointment Start Time: 11:25am Duration: 19 minutes Provider: Lawerance Cruel, Psy.D. Type of Session: Individual Therapy  Location of Patient: Parked in car at work Location of Provider: Provider's Home Type of Contact: Telepsychological Visit via MyChart Video Visit/ Telephone call  Session Content: Suzan called this provider's office prior to her appointment time (11am) due to technical issues. She indicated she was driving to work. Due to safety concerns, Quinnetta was receptive to this provider calling her at 11:20am as she would be at work and parked at that time. This provider called her at 11:21am. Assistance on connecting was provided. Due to ongoing technical difficulties, Sharica provided verbal consent to proceed via telephone.     Koraline is a 29 y.o. female presenting for a follow-up appointment to address the previously established treatment goal of increasing coping skills. Today's appointment was a telepsychological visit due to COVID-19. Meily provided verbal consent for today's telepsychological appointment and she is aware she is responsible for securing confidentiality on her end of the session. Prior to proceeding with today's appointment, Bryona's physical location at the time of this appointment was obtained as well a phone number she could be reached at in the event of technical difficulties. Paizley and this provider participated in today's telepsychological service.   This provider conducted a brief check-in. Myrle shared she did not recently lose weight. It was reportedly identified during her last visit with Dr. Sharee Holster that skipping meals is likely contributing to the aforementioned. The importance of eating all food on the meal plan was reviewed. Reyonna acknowledged she sometimes skips breakfast when she wakes up late. Thus, she was engaged in problem solving to ensure she is eating everything on the plan.  Moreover, a plan was developed to help Devika cope with emotional eating in the future using learned skills. She wrote down the following plan: focus on hydration; be prepared with snacks congruent to the meal plan; pause to ask questions when experiencing cravings/urges (e.g., Am I really hungry?, Is there something bothering me?, and Will I feel better if I eat?); and engage in discussed/learned coping strategies (e.g., praying, journaling, engaging in mindfulness exercises, exercising) after going through the aforementioned questions. Marli was receptive to today's appointment as evidenced by openness to sharing, responsiveness to feedback, and willingness to continue engaging in learned skills.  Mental Status Examination:  Appearance: unable to assess  Behavior: unable to assess Mood: euthymic Affect: unable to fully assess Speech: normal in rate, volume, and tone Eye Contact: unable to assess Psychomotor Activity: unable to assess  Gait: unable to assess Thought Process: linear, logical, and goal directed  Thought Content/Perception: no hallucinations, delusions, bizarre thinking or behavior reported or observed and no evidence of suicidal and homicidal ideation, plan, and intent Orientation: time, person, place, and purpose of appointment Memory/Concentration: memory, attention, language, and fund of knowledge intact  Insight/Judgment: fair  Interventions:  Conducted a brief chart review Provided empathic reflections and validation Employed supportive psychotherapy interventions to facilitate reduced distress and to improve coping skills with identified stressors Engaged patient in problem solving Reviewed learned skills  DSM-5 Diagnosis(es): 307.59 (F50.8) Other Specified Feeding or Eating Disorder, Emotional Eating Behaviors and 300.00 (F41.9) Unspecified Anxiety Disorder  Treatment Goal & Progress: During the initial appointment with this provider, the following treatment goal was  established: increase coping skills. Telesia demonstrated progress in her goal as evidenced by increased awareness of hunger patterns, increased  awareness of triggers for emotional eating and reduction in emotional eating. Ariza also continues to demonstrate willingness to engage in learned skill(s).  Plan: As previously planned, today was Letasha's last appointment with this provider. She acknowledged understanding that she may request a follow-up appointment with this provider in the future (following this provider's maternity leave) as long as she is still established with the clinic. No further follow-up planned by this provider.

## 2019-11-13 ENCOUNTER — Other Ambulatory Visit (INDEPENDENT_AMBULATORY_CARE_PROVIDER_SITE_OTHER): Payer: Self-pay | Admitting: Family Medicine

## 2019-11-13 ENCOUNTER — Ambulatory Visit (INDEPENDENT_AMBULATORY_CARE_PROVIDER_SITE_OTHER): Payer: No Typology Code available for payment source | Admitting: Family Medicine

## 2019-11-13 ENCOUNTER — Other Ambulatory Visit: Payer: Self-pay

## 2019-11-13 ENCOUNTER — Encounter (INDEPENDENT_AMBULATORY_CARE_PROVIDER_SITE_OTHER): Payer: Self-pay | Admitting: Family Medicine

## 2019-11-13 VITALS — BP 111/76 | HR 83 | Temp 98.2°F | Ht 69.0 in | Wt 245.0 lb

## 2019-11-13 DIAGNOSIS — Z6836 Body mass index (BMI) 36.0-36.9, adult: Secondary | ICD-10-CM

## 2019-11-13 DIAGNOSIS — Z794 Long term (current) use of insulin: Secondary | ICD-10-CM

## 2019-11-13 DIAGNOSIS — E1165 Type 2 diabetes mellitus with hyperglycemia: Secondary | ICD-10-CM | POA: Diagnosis not present

## 2019-11-13 DIAGNOSIS — Z9189 Other specified personal risk factors, not elsewhere classified: Secondary | ICD-10-CM | POA: Diagnosis not present

## 2019-11-13 MED ORDER — TRULICITY 3 MG/0.5ML ~~LOC~~ SOAJ: 3.0000 mg | SUBCUTANEOUS | 0 refills | Status: DC

## 2019-11-15 NOTE — Progress Notes (Signed)
Chief Complaint:   OBESITY Samantha Lucas is here to discuss her progress with her obesity treatment plan along with follow-up of her obesity related diagnoses. Samantha Lucas is on the Category 2 Plan and states she is following her eating plan approximately 90-95% of the time. Samantha Lucas states she is doing Hospital doctor for 30 minutes 3-5 times per week.  Today's visit was #: 8 Starting weight: 250 lbs Starting date: 07/18/2019 Today's weight: 245 lbs Today's date: 11/13/2019 Total lbs lost to date: 5 lbs Total lbs lost since last in-office visit: 0  Interim History: Samantha Lucas really followed the plan over the past 2 weeks.  She only missed breakfast on "someday" and was not eating a full lunch either - only buying iceberg lettuce and some shredded cheese with Skinny Girl dressing-  no proteins at times, or very, very little.  Not eating any fruit either.  Still just not getting gall her foods in during the day.  Assessment/Plan:   1. Type 2 diabetes mellitus with hyperglycemia, with long-term current use of insulin (HCC) Bostyn increased her Trulicity dose last Wednesday and is taking it as prescribed and tolerating the increased dose well.  Cravings and hunger are controlled.  Plan:  Continue home blood sugar monitoring, decrease carbs, increase proteins, and increase exercise.  A1c NOT at goal yet.   -Refill Dulaglutide (TRULICITY) 3 MG/0.5ML SOPN; Inject 3 mg as directed once a week.  Dispense: 2 mL; Refill: 0  Lab Results  Component Value Date   HGBA1C 8.4 (H) 10/04/2019   HGBA1C 12.6 (H) 06/06/2019   HGBA1C 11.1 (H) 10/17/2018   Lab Results  Component Value Date   MICROALBUR <0.2 06/06/2019   LDLCALC 99 10/04/2019   CREATININE 0.60 10/04/2019   2. At risk for constipation Samantha Lucas is at increased risk for constipation due to inadequate water intake, changes in diet, and/or use of certain medications.   We discussed preventative OTC therapies such as psyllium husks and miralax prn . I  also encouraged adequate fiber and  for pt to drink 1/2 weight in ounces of water per day, unless otherwise told by a Cardiologist, Nephrologist or other provider otherwise.    3. Class 2 severe obesity with serious comorbidity and body mass index (BMI) of 36.0 to 36.9 in adult, unspecified obesity type (HCC)  Samantha Lucas is currently in the action stage of change. As such, her goal is to continue with weight loss efforts. She has agreed to the Category 2 Plan.   Exercise goals: For substantial health benefits, adults should do at least 150 minutes (2 hours and 30 minutes) a week of moderate-intensity, or 75 minutes (1 hour and 15 minutes) a week of vigorous-intensity aerobic physical activity, or an equivalent combination of moderate- and vigorous-intensity aerobic activity. Aerobic activity should be performed in episodes of at least 10 minutes, and preferably, it should be spread throughout the week.  Behavioral modification strategies: no skipping meals, meal planning and cooking strategies and planning for success.  Samantha Lucas has agreed to follow-up with our clinic in 2 weeks. She was informed of the importance of frequent follow-up visits to maximize her success with intensive lifestyle modifications for her multiple health conditions.   Objective:   Blood pressure 111/76, pulse 83, temperature 98.2 F (36.8 C), height 5\' 9"  (1.753 m), weight 245 lb (111.1 kg), SpO2 99 %. Body mass index is 36.18 kg/m.  General: Cooperative, alert, well developed, in no acute distress. HEENT: Conjunctivae and lids unremarkable. Cardiovascular:  Regular rhythm.  Lungs: Normal work of breathing. Neurologic: No focal deficits.   Lab Results  Component Value Date   CREATININE 0.60 10/04/2019   BUN 12 10/04/2019   NA 136 10/04/2019   K 4.6 10/04/2019   CL 101 10/04/2019   CO2 29 10/04/2019   Lab Results  Component Value Date   ALT 12 10/04/2019   AST 14 10/04/2019   ALKPHOS 108 02/07/2016   BILITOT 0.4  10/04/2019   Lab Results  Component Value Date   HGBA1C 8.4 (H) 10/04/2019   HGBA1C 12.6 (H) 06/06/2019   HGBA1C 11.1 (H) 10/17/2018   HGBA1C 13.2 (H) 09/14/2018   HGBA1C 13.4 (H) 03/07/2018   Lab Results  Component Value Date   TSH 2.100 07/18/2019   Lab Results  Component Value Date   CHOL 159 10/04/2019   HDL 44 (L) 10/04/2019   LDLCALC 99 10/04/2019   TRIG 70 10/04/2019   CHOLHDL 3.6 10/04/2019   Lab Results  Component Value Date   WBC 9.5 10/04/2019   HGB 13.0 10/04/2019   HCT 41.2 10/04/2019   MCV 81.1 10/04/2019   PLT 369 10/04/2019   Lab Results  Component Value Date   IRON 18 (L) 07/17/2013   Attestation Statements:   Reviewed by clinician on day of visit: allergies, medications, problem list, medical history, surgical history, family history, social history, and previous encounter notes.  I, Insurance claims handler, CMA, am acting as Energy manager for [employer], DO.  I have reviewed the above documentation for accuracy and completeness, and I agree with the above. Samantha Lucas, D.O.  The 21st Century Cures Act was signed into law in 2016 which includes the topic of electronic health records.  This provides immediate access to information in MyChart.  This includes consultation notes, operative notes, office notes, lab results and pathology reports.  If you have any questions about what you read please let us know at your next visit so we can discuss your concerns and take corrective action if need be.  We are right here with you.

## 2019-11-22 ENCOUNTER — Telehealth (INDEPENDENT_AMBULATORY_CARE_PROVIDER_SITE_OTHER): Payer: No Typology Code available for payment source | Admitting: Psychology

## 2019-11-22 DIAGNOSIS — F5089 Other specified eating disorder: Secondary | ICD-10-CM

## 2019-11-22 DIAGNOSIS — F419 Anxiety disorder, unspecified: Secondary | ICD-10-CM

## 2019-11-27 ENCOUNTER — Other Ambulatory Visit: Payer: Self-pay

## 2019-11-27 ENCOUNTER — Encounter (INDEPENDENT_AMBULATORY_CARE_PROVIDER_SITE_OTHER): Payer: Self-pay | Admitting: Family Medicine

## 2019-11-27 ENCOUNTER — Ambulatory Visit (INDEPENDENT_AMBULATORY_CARE_PROVIDER_SITE_OTHER): Payer: No Typology Code available for payment source | Admitting: Family Medicine

## 2019-11-27 VITALS — BP 111/71 | HR 75 | Temp 98.3°F | Ht 69.0 in | Wt 245.0 lb

## 2019-11-27 DIAGNOSIS — Z794 Long term (current) use of insulin: Secondary | ICD-10-CM

## 2019-11-27 DIAGNOSIS — E118 Type 2 diabetes mellitus with unspecified complications: Secondary | ICD-10-CM | POA: Insufficient documentation

## 2019-11-27 DIAGNOSIS — E1169 Type 2 diabetes mellitus with other specified complication: Secondary | ICD-10-CM | POA: Diagnosis not present

## 2019-11-27 DIAGNOSIS — Z6836 Body mass index (BMI) 36.0-36.9, adult: Secondary | ICD-10-CM

## 2019-11-27 DIAGNOSIS — Z9189 Other specified personal risk factors, not elsewhere classified: Secondary | ICD-10-CM | POA: Diagnosis not present

## 2019-11-27 DIAGNOSIS — E119 Type 2 diabetes mellitus without complications: Secondary | ICD-10-CM | POA: Insufficient documentation

## 2019-11-27 MED ORDER — TRULICITY 3 MG/0.5ML ~~LOC~~ SOAJ: 3.0000 mg | SUBCUTANEOUS | 0 refills | Status: DC

## 2019-11-28 NOTE — Progress Notes (Signed)
Chief Complaint:   OBESITY Samantha Lucas is here to discuss her progress with her obesity treatment plan along with follow-up of her obesity related diagnoses. Samantha Lucas is on the Category 2 Plan and states she is following her eating plan approximately 95% of the time. Samantha Lucas states she is doing videos on YouTube for 30 minutes 2-3 times per week.  Today's visit was #: 9 Starting weight: 250 lbs Starting date: 07/18/219 Today's weight: 245 lbs Today's date: 11/27/2019 Total lbs lost to date: 5 lbs Total lbs lost since last in-office visit: 0 Total weight loss percentage to date: -2.00%   Interim History:  - Samantha Lucas increased her Trulicity dose at last office visit.  She is tolerating it well without side effects.  Blood sugars are much improved from prior.    - Only drinking 2 bottles of water per day.  Knows she needs more.    - Frustrated she did not lose more, but she is up in water weight today.   Assessment/Plan:    Meds ordered this encounter  Medications  . Dulaglutide (TRULICITY) 3 MG/0.5ML SOPN    Sig: Inject 3 mg as directed once a week.    Dispense:  2 mL    Refill:  0     1. Type 2 diabetes mellitus with other specified complication, with long-term current use of insulin (HCC) Discussed labs with patient today.  FBS 106, 116.  She says this is improved.  She is happy with her sugars.  They are the "best they've been in a long while".  Denies lows or concerns.  Plan:  Refill Trulicy at current dose, as per below.  Continue other medications per other doctors for diabetes as well.  Continue home blood sugar monitoring and follow prudent nutritional plan and weight loss.  Lab Results  Component Value Date   HGBA1C 8.4 (H) 10/04/2019   HGBA1C 12.6 (H) 06/06/2019   HGBA1C 11.1 (H) 10/17/2018   Lab Results  Component Value Date   MICROALBUR <0.2 06/06/2019   LDLCALC 99 10/04/2019   CREATININE 0.60 10/04/2019   -Refill Dulaglutide (TRULICITY) 3 MG/0.5ML SOPN; Inject  3 mg as directed once a week.  Dispense: 2 mL; Refill: 0    2. At risk for dehydration Samantha Lucas is at higher than average risk of dehydration.  Samantha Lucas was given more than 8.5 minutes of proper hydration counseling today.  We discussed the signs and symptoms of dehydration, some of which may include muscle cramping, constipation, or even orthostatic symptoms.  Counseling on the prevention of dehydration was also provided today.  Samantha Lucas is at risk for dehydration due to weight loss, lifestyle and behavorial habits, and possibly due to taking certain medication(s).  She was encouraged to adequately hydrate and monitor fluid status to avoid dehydration as well as weight loss plateaus.  Unless pre-existing renal or cardiopulmonary conditions exist, which pt was told to limit their fluid intake.  I recommended roughly one half of their weight in pounds to be the approximate ounces of non-caloric, non-caffeinated beverages they should drink per day; including more if they are engaging in exercise.    3. Class 2 severe obesity with serious comorbidity and body mass index (BMI) of 36.0 to 36.9 in adult, unspecified obesity type (HCC)  Samantha Lucas is currently in the action stage of change. As such, her goal is to continue with weight loss efforts.   She has agreed to the Category 2 Plan, but with her 200 snack calories per day,  advised to use on Samantha Lucas protein 10:1 ratio options  Exercise goals: For substantial health benefits, adults should do at least 150 minutes (2 hours and 30 minutes) a week of moderate-intensity, or 75 minutes (1 hour and 15 minutes) a week of vigorous-intensity aerobic physical activity, or an equivalent combination of moderate- and vigorous-intensity aerobic activity. Aerobic activity should be performed in episodes of at least 10 minutes, and preferably, it should be spread throughout the week.  Behavioral modification strategies: increasing lean protein intake, decreasing simple carbohydrates,  increasing water intake, meal planning and cooking strategies, better snacking choices and planning for success.  Samantha Lucas has agreed to follow-up with our clinic in 2-3 weeks. She was informed of the importance of frequent follow-up visits to maximize her success with intensive lifestyle modifications for her multiple health conditions.     Objective:   Blood pressure 111/71, pulse 75, temperature 98.3 F (36.8 C), height 5\' 9"  (1.753 m), weight 245 lb (111.1 kg), SpO2 97 %. Body mass index is 36.18 kg/m.  General: Cooperative, alert, well developed, in no acute distress. HEENT: Conjunctivae and lids unremarkable. Cardiovascular: Regular rhythm.  Lungs: Normal work of breathing. Neurologic: No focal deficits.   Lab Results  Component Value Date   CREATININE 0.60 10/04/2019   BUN 12 10/04/2019   NA 136 10/04/2019   K 4.6 10/04/2019   CL 101 10/04/2019   CO2 29 10/04/2019   Lab Results  Component Value Date   ALT 12 10/04/2019   AST 14 10/04/2019   ALKPHOS 108 02/07/2016   BILITOT 0.4 10/04/2019   Lab Results  Component Value Date   HGBA1C 8.4 (H) 10/04/2019   HGBA1C 12.6 (H) 06/06/2019   HGBA1C 11.1 (H) 10/17/2018   HGBA1C 13.2 (H) 09/14/2018   HGBA1C 13.4 (H) 03/07/2018   Lab Results  Component Value Date   TSH 2.100 07/18/2019   Lab Results  Component Value Date   CHOL 159 10/04/2019   HDL 44 (L) 10/04/2019   LDLCALC 99 10/04/2019   TRIG 70 10/04/2019   CHOLHDL 3.6 10/04/2019   Lab Results  Component Value Date   WBC 9.5 10/04/2019   HGB 13.0 10/04/2019   HCT 41.2 10/04/2019   MCV 81.1 10/04/2019   PLT 369 10/04/2019   Lab Results  Component Value Date   IRON 18 (L) 07/17/2013   Attestation Statements:   Reviewed by clinician on day of visit: allergies, medications, problem list, medical history, surgical history, family history, social history, and previous encounter notes.  I, 07/19/2013, CMA, am acting as Insurance claims handler for Energy manager,  DO.  I have reviewed the above documentation for accuracy and completeness, and I agree with the above. [employer], D.O.  The 21st Century Cures Act was signed into law in 2016 which includes the topic of electronic health records.  This provides immediate access to information in MyChart.  This includes consultation notes, operative notes, office notes, lab results and pathology reports.  If you have any questions about what you read please let 2017 know at your next visit so we can discuss your concerns and take corrective action if need be.  We are right here with you.

## 2019-12-04 ENCOUNTER — Other Ambulatory Visit: Payer: Self-pay | Admitting: Emergency Medicine

## 2019-12-04 ENCOUNTER — Ambulatory Visit
Admission: RE | Admit: 2019-12-04 | Discharge: 2019-12-04 | Disposition: A | Discharge status: Home / Self Care | Payer: No Typology Code available for payment source | Source: Ambulatory Visit | Attending: Emergency Medicine | Admitting: Emergency Medicine

## 2019-12-04 ENCOUNTER — Telehealth: Payer: Self-pay | Admitting: Family Medicine

## 2019-12-04 ENCOUNTER — Other Ambulatory Visit: Payer: Self-pay

## 2019-12-04 VITALS — BP 109/65 | HR 95 | Temp 98.3°F | Resp 19 | Ht 69.0 in | Wt 245.0 lb

## 2019-12-04 DIAGNOSIS — M5442 Lumbago with sciatica, left side: Secondary | ICD-10-CM

## 2019-12-04 MED ORDER — CYCLOBENZAPRINE HCL 10 MG PO TABS: 10.0000 mg | ORAL_TABLET | Freq: Every day | ORAL | 0 refills | Status: DC

## 2019-12-04 MED ORDER — DEXAMETHASONE SODIUM PHOSPHATE 10 MG/ML IJ SOLN
10.0000 mg | Freq: Once | INTRAMUSCULAR | Status: AC
  Administered 2019-12-04: 10 mg via INTRAMUSCULAR

## 2019-12-04 MED ORDER — PREDNISONE 20 MG PO TABS: 20.0000 mg | ORAL_TABLET | Freq: Two times a day (BID) | ORAL | 0 refills | Status: DC

## 2019-12-04 NOTE — Discharge Instructions (Signed)
Continue conservative management of rest, ice, and gentle stretches Prednisone prescribed.  Take as directed and to completion Take cyclobenzaprine at nighttime for symptomatic relief. Avoid driving or operating heavy machinery while using medication. Follow up with PCP if symptoms persist Return or go to the ER if you have any new or worsening symptoms (fever, chills, chest pain, abdominal pain, changes in bowel or bladder habits, pain radiating into lower legs, etc...)  

## 2019-12-04 NOTE — ED Triage Notes (Addendum)
Low back pain that is worse on the LT side and radiates down the back of both legs. Pt has tried otc meds and stretches with no relief. Denies any injuries or urinary s/s.  Unable to work today.

## 2019-12-04 NOTE — ED Provider Notes (Signed)
[facility]   [id number] 12/04/19 Arrival Time: 1528  CC: Low back pain  SUBJECTIVE: History from: patient. Samantha Lucas is a 29 y.o. female complains of LT low back pain x 3 days.  Denies a precipitating event or specific injury.  Localizes the pain to the LT low back.  Describes the pain as intermittent and dull/ sharp in character.  Has tried OTC medications without relief.  Symptoms are made worse with sitting.  Denies similar symptoms in the past.  Denies fever, chills, erythema, ecchymosis, effusion, weakness, numbness and tingling, saddle paresthesias, loss of bowel or bladder function.      ROS: As per HPI.  All other pertinent ROS negative.     Past Medical History:  Diagnosis Date  . Back pain   . Diabetes mellitus without complication [facility]) May 2014  . Scoliosis (and kyphoscoliosis), idiopathic   . Viral cardiomyopathy (HCC) 2016   Past Surgical History:  Procedure Laterality Date  . WISDOM TOOTH EXTRACTION  dec 2012   Allergies  Allergen Reactions  . Metformin And Related     GI upset   . Other     [place]    No current facility-administered medications on file prior to encounter.   Current Outpatient Medications on File Prior to Encounter  Medication Sig Dispense Refill  . Dulaglutide (TRULICITY) 3 MG/0.5ML SOPN Inject 3 mg as directed once a week. 2 mL 0  . fluticasone (FLONASE) 50 MCG/ACT nasal spray Place 2 sprays into both nostrils daily. 16 g 6  . glipiZIDE (GLUCOTROL) 5 MG tablet Take 1 tablet (5 mg total) by mouth 2 (two) times daily before a meal. 60 tablet 3  . Glucose Blood (BLOOD GLUCOSE TEST STRIPS) STRP Use as directed to monitor FSBS 2x daily. Dx: E11.9. 100 each 11  . guaiFENesin-codeine (ROBITUSSIN AC) 100-10 MG/5ML syrup Virtussin AC 10 mg-100 mg/5 mL oral liquid    . Insulin Glargine (BASAGLAR KWIKPEN) 100 UNIT/ML Inject  20 units at bedtime (Patient taking differently: Inject  10 units at bedtime) 15 mL 3  . Insulin Pen Needle (BD  PEN NEEDLE NANO U/F) 32G X 4 MM MISC USE AS DIRECTED WITH BASAGLAR 100 each 1  . lisinopril (ZESTRIL) 2.5 MG tablet TAKE 1 TABLET BY MOUTH EVERY DAY TO PROTECT KIDNEYS 30 tablet 6  . Multiple Vitamin (MULITIVITAMIN WITH MINERALS) TABS Take 1 tablet by mouth daily.    Marland Kitchen omeprazole (PRILOSEC) 20 MG capsule TAKE 1 CAPSULE(20 MG) BY MOUTH DAILY 30 capsule 3  . vitamin C (ASCORBIC ACID) 500 MG tablet Take 500 mg by mouth daily.    . Vitamin D, Ergocalciferol, (DRISDOL) 1.25 MG (50000 UNIT) CAPS capsule Take 1 capsule (50,000 Units total) by mouth every 7 (seven) days. 4 capsule 0   Social History   Socioeconomic History  . Marital status: Single    Spouse name: Not on file  . Number of children: Not on file  . Years of education: Not on file  . Highest education level: Not on file  Occupational History  . Occupation: phlebotomist  Tobacco Use  . Smoking status: Never Smoker  . Smokeless tobacco: Never Used  Vaping Use  . Vaping Use: Never used  Substance and Sexual Activity  . Alcohol use: No  . Drug use: No  . Sexual activity: Never  Other Topics Concern  . Not on file  Social History Narrative  . Not on file   Social Determinants of Health   Financial Resource Strain:   .  Difficulty of Paying Living Expenses: Not on file  Food Insecurity:   . Worried About Programme researcher, broadcasting/film/video in the Last Year: Not on file  . Ran Out of Food in the Last Year: Not on file  Transportation Needs:   . Lack of Transportation (Medical): Not on file  . Lack of Transportation (Non-Medical): Not on file  Physical Activity:   . Days of Exercise per Week: Not on file  . Minutes of Exercise per Session: Not on file  Stress:   . Feeling of Stress : Not on file  Social Connections:   . Frequency of Communication with Friends and Family: Not on file  . Frequency of Social Gatherings with Friends and Family: Not on file  . Attends Religious Services: Not on file  . Active Member of Clubs or  Organizations: Not on file  . Attends Banker Meetings: Not on file  . Marital Status: Not on file  Intimate Partner Violence:   . Fear of Current or Ex-Partner: Not on file  . Emotionally Abused: Not on file  . Physically Abused: Not on file  . Sexually Abused: Not on file   Family History  Problem Relation Age of Onset  . Hypertension Mother   . Cancer Mother   . Obesity Mother   . Stroke Father   . Hypertension Father   . Diabetes Father   . Cancer Paternal Grandmother        lung and breast cancer  . Hypertension Paternal Grandmother     OBJECTIVE:  Vitals:   12/04/19 1600 12/04/19 1602  BP:  109/65  Pulse:  95  Resp:  19  Temp:  98.3 F (36.8 C)  TempSrc:  Oral  SpO2:  96%  Weight: 245 lb (111.1 kg)   Height: 5\' 9"  (1.753 m)     General appearance: ALERT; in no acute distress.  Head: NCAT Lungs: Normal respiratory effort Musculoskeletal: Back Inspection: Skin warm, dry, clear and intact without obvious erythema, effusion, or ecchymosis.  Palpation: TTP over LT low back ROM: FROM active and passive Strength: 5/5 shld abduction, 5/5 shld adduction, 5/5 elbow flexion, 5/5 elbow extension, 5/5 grip strength, 5/5 hip flexion, 5/5 hip extension, 5/5 knee flexion, 5/5 knee extension Skin: warm and dry Neurologic: Ambulates without difficulty; Sensation intact about the upper/ lower extremities Psychological: alert and cooperative; normal mood and affect   ASSESSMENT & PLAN:  1. Acute left-sided low back pain with left-sided sciatica     Meds ordered this encounter  Medications  . predniSONE (DELTASONE) 20 MG tablet    Sig: Take 1 tablet (20 mg total) by mouth 2 (two) times daily with a meal for 5 days.    Dispense:  10 tablet    Refill:  0    Order Specific Question:   Supervising Provider    Answer:   Eustace Moore  . cyclobenzaprine (FLEXERIL) 10 MG tablet    Sig: Take 1 tablet (10 mg total) by mouth at bedtime.    Dispense:   15 tablet    Refill:  0    Order Specific Question:   Supervising Provider    Answer:   [[id number]] Eustace Moore  . dexamethasone (DECADRON) injection 10 mg    Continue conservative management of rest, ice, and gentle stretches Prednisone prescribed.  Take as directed and to completion Take cyclobenzaprine at nighttime for symptomatic relief. Avoid driving or operating heavy machinery while using medication. Follow up with PCP  if symptoms persist Return or go to the ER if you have any new or worsening symptoms (fever, chills, chest pain, abdominal pain, changes in bowel or bladder habits, pain radiating into lower legs, etc...)   Reviewed expectations re: course of current medical issues. Questions answered. Outlined signs and symptoms indicating need for more acute intervention. Patient verbalized understanding. After Visit Summary given.    Rennis Harding, PA-C 12/04/19 1636

## 2019-12-04 NOTE — Telephone Encounter (Signed)
Received call from patient.   Reports that she has lower back pain radiating down to her R hip and buttock x2 days. States that she has tried Ice., heat, APAP, IBU, Aleve, and stretches with no relief. Advised that she will need to be seen to be evaluated. Reports that she will go to UC as PCP has no available appointments today.

## 2019-12-04 NOTE — Telephone Encounter (Signed)
Patient called in stating she was having back pain again. Would like to know what she can do for it.  CB# [phone]

## 2019-12-05 ENCOUNTER — Telehealth: Payer: Self-pay | Admitting: *Deleted

## 2019-12-05 NOTE — Telephone Encounter (Signed)
Schedule OV for recheck She was given flexeril and steroids, these help inflammation and the spasm

## 2019-12-05 NOTE — Telephone Encounter (Signed)
Received call from patient.   Reports that she has back pain and was seen by UC on 12/04/2019. Reports that she was given Prednisone, but continues to have increased pain in lower back.   Inquired as to if there is anything else that she can do for relief.

## 2019-12-06 NOTE — Telephone Encounter (Signed)
Sent to [facility] to schedule OV.

## 2019-12-07 ENCOUNTER — Other Ambulatory Visit: Payer: Self-pay

## 2019-12-07 ENCOUNTER — Encounter: Payer: Self-pay | Admitting: Emergency Medicine

## 2019-12-07 ENCOUNTER — Ambulatory Visit (INDEPENDENT_AMBULATORY_CARE_PROVIDER_SITE_OTHER): Payer: No Typology Code available for payment source

## 2019-12-07 ENCOUNTER — Ambulatory Visit
Admission: EM | Admit: 2019-12-07 | Discharge: 2019-12-07 | Disposition: A | Discharge status: Home / Self Care | Payer: No Typology Code available for payment source | Attending: Emergency Medicine | Admitting: Emergency Medicine

## 2019-12-07 DIAGNOSIS — M545 Low back pain, unspecified: Secondary | ICD-10-CM

## 2019-12-07 DIAGNOSIS — M5442 Lumbago with sciatica, left side: Secondary | ICD-10-CM

## 2019-12-07 LAB — POCT URINE PREGNANCY: Preg Test, Ur: NEGATIVE

## 2019-12-07 NOTE — ED Provider Notes (Signed)
[facility] CARE CENTER   [id number] 12/07/19 Arrival Time: 1537   Chief Complaint  Patient presents with  . Back Pain     SUBJECTIVE: History from: patient.  Samantha Lucas is a 29 y.o. female with history of diabetes presented to the urgent care with a complaint of left-sided back pain that radiated down to her left leg for the past 1 week.  Was previously seen on 12/04/2019 and was prescribed prednisone, Flexeril.  Decadron IM was given in office at that time.  She reports worsening symptoms.  Described the pain as constant and achy that radiates to left leg.  Symptoms are made worse with sitting.  Denies similar symptoms in the past.  She denies chills, fever, nausea, vomiting, diarrhea, confusion, weakness, numbness, tingling, paresthesia, loss of control of bowel and  bladder.  ROS: As per HPI.  All other pertinent ROS negative.      Past Medical History:  Diagnosis Date  . Back pain   . Diabetes mellitus without complication [facility]) May 2014  . Scoliosis (and kyphoscoliosis), idiopathic   . Viral cardiomyopathy (HCC) 2016   Past Surgical History:  Procedure Laterality Date  . WISDOM TOOTH EXTRACTION  dec 2012   Allergies  Allergen Reactions  . Metformin And Related     GI upset   . Other     [place]    No current facility-administered medications on file prior to encounter.   Current Outpatient Medications on File Prior to Encounter  Medication Sig Dispense Refill  . cyclobenzaprine (FLEXERIL) 10 MG tablet Take 1 tablet (10 mg total) by mouth at bedtime. 15 tablet 0  . Dulaglutide (TRULICITY) 3 MG/0.5ML SOPN Inject 3 mg as directed once a week. 2 mL 0  . fluticasone (FLONASE) 50 MCG/ACT nasal spray Place 2 sprays into both nostrils daily. 16 g 6  . glipiZIDE (GLUCOTROL) 5 MG tablet Take 1 tablet (5 mg total) by mouth 2 (two) times daily before a meal. 60 tablet 3  . Glucose Blood (BLOOD GLUCOSE TEST STRIPS) STRP Use as directed to monitor FSBS 2x daily. Dx: E11.9.  100 each 11  . guaiFENesin-codeine (ROBITUSSIN AC) 100-10 MG/5ML syrup Virtussin AC 10 mg-100 mg/5 mL oral liquid    . Insulin Glargine (BASAGLAR KWIKPEN) 100 UNIT/ML Inject  20 units at bedtime (Patient taking differently: Inject  10 units at bedtime) 15 mL 3  . Insulin Pen Needle (BD PEN NEEDLE NANO U/F) 32G X 4 MM MISC USE AS DIRECTED WITH BASAGLAR 100 each 1  . lisinopril (ZESTRIL) 2.5 MG tablet TAKE 1 TABLET BY MOUTH EVERY DAY TO PROTECT KIDNEYS 30 tablet 6  . Multiple Vitamin (MULITIVITAMIN WITH MINERALS) TABS Take 1 tablet by mouth daily.    Marland Kitchen omeprazole (PRILOSEC) 20 MG capsule TAKE 1 CAPSULE(20 MG) BY MOUTH DAILY 30 capsule 3  . predniSONE (DELTASONE) 20 MG tablet Take 1 tablet (20 mg total) by mouth 2 (two) times daily with a meal for 5 days. 10 tablet 0  . vitamin C (ASCORBIC ACID) 500 MG tablet Take 500 mg by mouth daily.    . Vitamin D, Ergocalciferol, (DRISDOL) 1.25 MG (50000 UNIT) CAPS capsule Take 1 capsule (50,000 Units total) by mouth every 7 (seven) days. 4 capsule 0   Social History   Socioeconomic History  . Marital status: Single    Spouse name: Not on file  . Number of children: Not on file  . Years of education: Not on file  . Highest education level: Not on  file  Occupational History  . Occupation: phlebotomist  Tobacco Use  . Smoking status: Never Smoker  . Smokeless tobacco: Never Used  Vaping Use  . Vaping Use: Never used  Substance and Sexual Activity  . Alcohol use: No  . Drug use: No  . Sexual activity: Never  Other Topics Concern  . Not on file  Social History Narrative  . Not on file   Social Determinants of Health   Financial Resource Strain:   . Difficulty of Paying Living Expenses: Not on file  Food Insecurity:   . Worried About Programme researcher, broadcasting/film/video in the Last Year: Not on file  . Ran Out of Food in the Last Year: Not on file  Transportation Needs:   . Lack of Transportation (Medical): Not on file  . Lack of Transportation (Non-Medical):  Not on file  Physical Activity:   . Days of Exercise per Week: Not on file  . Minutes of Exercise per Session: Not on file  Stress:   . Feeling of Stress : Not on file  Social Connections:   . Frequency of Communication with Friends and Family: Not on file  . Frequency of Social Gatherings with Friends and Family: Not on file  . Attends Religious Services: Not on file  . Active Member of Clubs or Organizations: Not on file  . Attends Banker Meetings: Not on file  . Marital Status: Not on file  Intimate Partner Violence:   . Fear of Current or Ex-Partner: Not on file  . Emotionally Abused: Not on file  . Physically Abused: Not on file  . Sexually Abused: Not on file   Family History  Problem Relation Age of Onset  . Hypertension Mother   . Cancer Mother   . Obesity Mother   . Stroke Father   . Hypertension Father   . Diabetes Father   . Cancer Paternal Grandmother        lung and breast cancer  . Hypertension Paternal Grandmother     OBJECTIVE:  Vitals:   12/07/19 1547 12/07/19 1549  BP: 112/74   Pulse: 90   Resp: 18   Temp: 98.6 F (37 C)   TempSrc: Oral   SpO2: 98%   Weight:  244 lb 11.4 oz (111 kg)  Height:  5\' 9"  (1.753 m)     Physical Exam Vitals and nursing note reviewed.  Constitutional:      General: She is not in acute distress.    Appearance: Normal appearance. She is normal weight. She is not ill-appearing, toxic-appearing or diaphoretic.  HENT:     Head: Normocephalic.  Cardiovascular:     Rate and Rhythm: Normal rate and regular rhythm.     Pulses: Normal pulses.     Heart sounds: Normal heart sounds. No murmur heard.  No friction rub. No gallop.   Pulmonary:     Effort: Pulmonary effort is normal. No respiratory distress.     Breath sounds: Normal breath sounds. No stridor. No wheezing, rhonchi or rales.  Chest:     Chest wall: No tenderness.  Musculoskeletal:        General: Tenderness present.     Lumbar back: Spasms and  tenderness present. Positive left straight leg raise test.     Comments: Back:  Patient ambulates from chair to exam table without difficulty.  Inspection: Skin clear and intact without obvious swelling, erythema, or ecchymosis. Warm to the touch  Palpation: Vertebral processes nontender. Tenderness about the  paravertebral muscles  ROM: FROM Strength: 5/5 hip flexion, 5/5 knee extension, 5/5 knee flexion, 5/5 plantar flexion, 5/5 dorsiflexion    Neurological:     Mental Status: She is alert and oriented to person, place, and time.     LABS:  Results for orders placed or performed during the hospital encounter of 12/07/19 (from the past 24 hour(s))  POCT urine pregnancy     Status: None   Collection Time: 12/07/19  4:12 PM  Result Value Ref Range   Preg Test, Ur Negative Negative     RADIOLOGY:  DG Lumbar Spine 2-3 Views  Result Date: 12/07/2019 CLINICAL DATA:  29 year old with an approximate 2 week history of low back pain radiating into the LEFT lower extremity. EXAM: LUMBAR SPINE - 2-3 VIEW COMPARISON:  03/25/2011. FINDINGS: Five non-rib-bearing lumbar vertebrae. Straightening of the usual lumbar lordosis. Anatomic posterior alignment. No fractures. Well-preserved disc spaces. No facet arthropathy. Sacroiliac joints anatomically aligned without degenerative changes. IMPRESSION: Straightening of the usual lordosis which may reflect positioning and/or spasm. Otherwise normal examination. Electronically Signed   By: Hulan Saas M.D.   On: 12/07/2019 16:50     lumbar X-ray is negative for bony abnormality including fracture or dislocation.  I have reviewed the x-ray myself and the radiologist interpretation.  I am in agreement with the radiologist interpretation.   ASSESSMENT & PLAN:  1. Acute left-sided low back pain with left-sided sciatica     No orders of the defined types were placed in this encounter.   Discharge instructions  Continue to take Flexeril and  prednisone as prescribed and directed May add OTC Tylenol 500 mg as needed for pain Follow-up with PCP if symptom does not resolve or improve Return or go to ED if you develop any new or worsening of symptoms   Reviewed expectations re: course of current medical issues. Questions answered. Outlined signs and symptoms indicating need for more acute intervention. Patient verbalized understanding. After Visit Summary given.         Durward Parcel, FNP 12/07/19 1700

## 2019-12-07 NOTE — ED Triage Notes (Signed)
Low back pain that is worse on the LT side and radiates down both legs. Seen here on 12/04/19 for same.  Pt states she is taking the meds that were prescribed with no relief.

## 2019-12-07 NOTE — Discharge Instructions (Addendum)
Continue to take Flexeril and prednisone as prescribed and directed May add OTC Tylenol 500 mg as needed for pain Follow-up with PCP if symptom does not resolve or improve Return or go to ED if you develop any new or worsening of symptoms

## 2019-12-11 ENCOUNTER — Ambulatory Visit (INDEPENDENT_AMBULATORY_CARE_PROVIDER_SITE_OTHER): Payer: No Typology Code available for payment source | Admitting: Family Medicine

## 2019-12-12 ENCOUNTER — Other Ambulatory Visit: Payer: Self-pay

## 2019-12-12 ENCOUNTER — Ambulatory Visit (INDEPENDENT_AMBULATORY_CARE_PROVIDER_SITE_OTHER): Payer: No Typology Code available for payment source | Admitting: Family Medicine

## 2019-12-12 ENCOUNTER — Other Ambulatory Visit: Payer: Self-pay | Admitting: Family Medicine

## 2019-12-12 ENCOUNTER — Encounter: Payer: Self-pay | Admitting: Family Medicine

## 2019-12-12 VITALS — BP 120/68 | HR 78 | Temp 97.9°F | Resp 14 | Ht 69.0 in | Wt 245.0 lb

## 2019-12-12 DIAGNOSIS — M5441 Lumbago with sciatica, right side: Secondary | ICD-10-CM | POA: Diagnosis not present

## 2019-12-12 DIAGNOSIS — M5442 Lumbago with sciatica, left side: Secondary | ICD-10-CM | POA: Diagnosis not present

## 2019-12-12 MED ORDER — HYDROCODONE-ACETAMINOPHEN 5-325 MG PO TABS: 1.0000 | ORAL_TABLET | Freq: Four times a day (QID) | ORAL | 0 refills | Status: DC | PRN

## 2019-12-12 MED ORDER — MELOXICAM 7.5 MG PO TABS: 7.5000 mg | ORAL_TABLET | Freq: Every day | ORAL | 1 refills | Status: DC

## 2019-12-12 MED ORDER — METHOCARBAMOL 500 MG PO TABS: 500.0000 mg | ORAL_TABLET | Freq: Three times a day (TID) | ORAL | 1 refills | Status: DC | PRN

## 2019-12-12 NOTE — Patient Instructions (Addendum)
Take Robaxin 2-3 times a day for muscle spasm meloxicam once a day for inflammation Take pain med as needed  Call for your FMLA, fax to my attention Referral to physical therapy  F/U as previous

## 2019-12-12 NOTE — Progress Notes (Signed)
   Subjective:    Patient ID: Samantha Lucas, female    DOB: 09/28/1990, 29 y.o.   MRN: [id number]  Patient presents for Lower Back Pain (back pain R>L, radiating down her legs has been seen at [facility])  Last Monday went to [facility] due to back pain ,then returned on 11/11 due to ongoing pain, she did havve some pain that was shooting down her back into buttocks R > L  She had xray done which showed no acute abnormalities but signs of spasm. Given steroid shot, given prednisone taper , also given Flexeril  This made her sleep, but pack pain unchanged The pain is still mostly in her lower back that is now mostly on the left side compared to the right. No particular injury No tingling or numbness in feet  Urinating normal, bowel movemnets are normal      Review Of Systems:  GEN- denies fatigue, fever, weight loss,weakness, recent illness HEENT- denies eye drainage, change in vision, nasal discharge, CVS- denies chest pain, palpitations RESP- denies SOB, cough, wheeze ABD- denies N/V, change in stools, abd pain GU- denies dysuria, hematuria, dribbling, incontinence MSK- + joint pain, muscle aches, injury Neuro- denies headache, dizziness, syncope, seizure activity       Objective:    BP 120/68   Pulse 78   Temp 97.9 F (36.6 C) (Temporal)   Resp 14   Ht 5\' 9"  (1.753 m)   Wt 245 lb (111.1 kg)   LMP 11/18/2019   SpO2 98%   BMI 36.18 kg/m  GEN- NAD, alert and oriented x3 CVS- RRR, no murmur RESP-CTAB ABD-NABS,soft,NT,ND MSK- TTP bilat paraspinals, spine mild TTP, mild SLR Left side, FROM Hips/Knees  NEURO- mild spasm back, DTR symmetric, antalgic gait, strength in tact LE, sensation in tact  EXT- No edema Pulses- Radial, DP- 2+        Assessment & Plan:      Problem List Items Addressed This Visit    None    Visit Diagnoses    Acute bilateral low back pain with bilateral sciatica    -  Primary   MSK pain, normal xray, works as phelbotomist pushes heavy cars, will remain  out of work rest of week return monday, Change to Saturday, robaxin, norco, Send for PT No red flags PT will hepp with pain and mobility  FMLA to be completed for Nov 8- Nov 21    Relevant Medications   methocarbamol (ROBAXIN) 500 MG tablet   meloxicam (MOBIC) 7.5 MG tablet   HYDROcodone-acetaminophen (NORCO) 5-325 MG tablet      Note: This dictation was prepared with Dragon dictation along with smaller phrase technology. Any transcriptional errors that result from this process are unintentional.

## 2019-12-26 ENCOUNTER — Other Ambulatory Visit (INDEPENDENT_AMBULATORY_CARE_PROVIDER_SITE_OTHER): Payer: Self-pay | Admitting: Family Medicine

## 2019-12-26 ENCOUNTER — Other Ambulatory Visit: Payer: Self-pay

## 2019-12-26 ENCOUNTER — Ambulatory Visit (INDEPENDENT_AMBULATORY_CARE_PROVIDER_SITE_OTHER): Payer: No Typology Code available for payment source | Admitting: Family Medicine

## 2019-12-26 ENCOUNTER — Encounter (INDEPENDENT_AMBULATORY_CARE_PROVIDER_SITE_OTHER): Payer: Self-pay | Admitting: Family Medicine

## 2019-12-26 VITALS — BP 116/82 | HR 68 | Temp 98.0°F | Ht 69.0 in | Wt 243.0 lb

## 2019-12-26 DIAGNOSIS — Z6836 Body mass index (BMI) 36.0-36.9, adult: Secondary | ICD-10-CM

## 2019-12-26 DIAGNOSIS — E1169 Type 2 diabetes mellitus with other specified complication: Secondary | ICD-10-CM

## 2019-12-26 DIAGNOSIS — Z9189 Other specified personal risk factors, not elsewhere classified: Secondary | ICD-10-CM | POA: Diagnosis not present

## 2019-12-26 DIAGNOSIS — E559 Vitamin D deficiency, unspecified: Secondary | ICD-10-CM

## 2019-12-26 MED ORDER — VITAMIN D (ERGOCALCIFEROL) 1.25 MG (50000 UNIT) PO CAPS: 50000.0000 [IU] | ORAL_CAPSULE | ORAL | 0 refills | Status: DC

## 2019-12-27 DIAGNOSIS — E559 Vitamin D deficiency, unspecified: Secondary | ICD-10-CM | POA: Insufficient documentation

## 2019-12-27 DIAGNOSIS — Z9189 Other specified personal risk factors, not elsewhere classified: Secondary | ICD-10-CM | POA: Insufficient documentation

## 2019-12-27 NOTE — Progress Notes (Signed)
Chief Complaint:   OBESITY Samantha Lucas is here to discuss her progress with her obesity treatment plan along with follow-up of her obesity related diagnoses. [name] is on the Category 2 Plan with 200 snack calories and 10:1 ratio and states she is following her eating plan approximately 75% of the time. [name] states she is exercising 0 minutes 0 times per week.  Today's visit was #: 9 Starting weight: 243 lbs Starting date: 07/18/2019 Today's weight: 243 lbs Today's date: 12/26/2019 Total lbs lost to date: 7 lbs Total lbs lost since last in-office visit: 2 lbs Total weight loss percentage to date: -2.80%  Interim History: Samantha Lucas has been dealing with sciatic nerve pain lately and is starting physical therapy next week. Currently being treated by PCP. She notes that some days she misses dinner or eats chicken nuggets or "unhealthy foods." Samantha Lucas is not eating her snack calories, besides a muscle milk at 60 calories plus 30g of protein. She is not exercising and recognizes that she can do better.  Assessment/Plan:   1. Type 2 diabetes mellitus with other specified complication, without long-term current use of insulin (HCC) We increased Hatsue's Trulicity in mid-October and she is tolerating it well. Her fasting blood sugar over the last couple of weeks have been between 140-190. Occasionally she misses Basaglar injection but otherwise takes Trulicity and Glucotrol daily.  Plan: Check A1c and CMP at next office visit, and if her A1c is not less than 7.0, I highly recommend that she be referred to endocrinology for future care. I advised that she discuss this with her PCP as well. I discussed determinants of poorly controlled diabetes in detail with Samantha Lucas today. She needs to work on prudent nutritional plan and weight loss, along with exercise to aid with blood sugar control.      Obtain at next office visit: - Comprehensive metabolic panel - Hemoglobin A1c  2. Vitamin D deficiency Annisa's  Vitamin D level was 47.6 on 10/30/2019. She is currently taking prescription vitamin D 50,000 IU each week. She denies nausea, vomiting or muscle weakness.  Plan: Refill Vit D for 1 month, as per below.  Refill- Vitamin D, Ergocalciferol, (DRISDOL) 1.25 MG (50000 UNIT) CAPS capsule; Take 1 capsule (50,000 Units total) by mouth every 7 (seven) days.  Dispense: 4 capsule; Refill: 0  3. At risk for heart disease Due to Samantha Lucas's current state of health and medical condition(s), she is at a higher risk for heart disease. This puts the patient at much greater risk to subsequently develop cardiopulmonary conditions that can significantly affect patient's quality of life in a negative manner as well.    At least 15 minutes was spent on counseling Trany about these concerns today and I stressed the importance of reversing risks factors of obesity, esp truncal and visceral fat, hypertension, hyperlipidemia, pre-diabetes.   Initial goal is to lose at least 5-10% of starting weight to help reduce these risk factors. Counseling: Intensive lifestyle modifications discussed with Samantha Lucas as most appropriate first line treatment. She will continue to work on diet, exercise and weight loss efforts.  We will continue to reassess these conditions on a fairly regular basis in an attempt to decrease patient's overall morbidity and mortality.  Evidence-based interventions for health behavior change were utilized today including the discussion of self monitoring techniques, problem-solving barriers and SMART goal setting techniques. Specifically regarding patient's less desirable eating habits and patterns, we employed the technique of small changes when Samantha Lucas has not been able  to fully commit to her prudent nutritional plan.  4. Class 2 severe obesity with serious comorbidity and body mass index (BMI) of 36.0 to 36.9 in adult, unspecified obesity type (HCC) Samantha Lucas is currently in the action stage of change. As such, her goal is  to continue with weight loss efforts. She has agreed to the Category 2 Plan with 200 snack calories and 10:1 ratio.   Exercise goals: All adults should avoid inactivity. Some physical activity is better than none, and adults who participate in any amount of physical activity gain some health benefits.  Behavioral modification strategies: increasing lean protein intake, decreasing simple carbohydrates, decreasing eating out, no skipping meals, meal planning and cooking strategies and planning for success.  Samantha Lucas has agreed to follow-up with our clinic in 2-3 weeks. We will need to recheck A1c and CMP at that time. She was informed of the importance of frequent follow-up visits to maximize her success with intensive lifestyle modifications for her multiple health conditions.   Objective:   Blood pressure 116/82, pulse 68, temperature 98 F (36.7 C), height 5\' 9"  (1.753 m), weight 243 lb (110.2 kg), SpO2 99 %. Body mass index is 35.88 kg/m.  General: Cooperative, alert, well developed, in no acute distress. HEENT: Conjunctivae and lids unremarkable. Cardiovascular: Regular rhythm.  Lungs: Normal work of breathing. Neurologic: No focal deficits.   Lab Results  Component Value Date   CREATININE 0.60 10/04/2019   BUN 12 10/04/2019   NA 136 10/04/2019   K 4.6 10/04/2019   CL 101 10/04/2019   CO2 29 10/04/2019   Lab Results  Component Value Date   ALT 12 10/04/2019   AST 14 10/04/2019   ALKPHOS 108 02/07/2016   BILITOT 0.4 10/04/2019   Lab Results  Component Value Date   HGBA1C 8.4 (H) 10/04/2019   HGBA1C 12.6 (H) 06/06/2019   HGBA1C 11.1 (H) 10/17/2018   HGBA1C 13.2 (H) 09/14/2018   HGBA1C 13.4 (H) 03/07/2018   Lab Results  Component Value Date   TSH 2.100 07/18/2019   Lab Results  Component Value Date   CHOL 159 10/04/2019   HDL 44 (L) 10/04/2019   LDLCALC 99 10/04/2019   TRIG 70 10/04/2019   CHOLHDL 3.6 10/04/2019   Lab Results  Component Value Date   WBC 9.5  10/04/2019   HGB 13.0 10/04/2019   HCT 41.2 10/04/2019   MCV 81.1 10/04/2019   PLT 369 10/04/2019   Lab Results  Component Value Date   IRON 18 (L) 07/17/2013   Attestation Statements:   Reviewed by clinician on day of visit: allergies, medications, problem list, medical history, surgical history, family history, social history, and previous encounter notes.  07/19/2013, am acting as Edmund Hilda for Energy manager, DO.  I have reviewed the above documentation for accuracy and completeness, and I agree with the above. [employer], D.O.  The 21st Century Cures Act was signed into law in 2016 which includes the topic of electronic health records.  This provides immediate access to information in MyChart.  This includes consultation notes, operative notes, office notes, lab results and pathology reports.  If you have any questions about what you read please let 2017 know at your next visit so we can discuss your concerns and take corrective action if need be.  We are right here with you.

## 2020-01-03 ENCOUNTER — Other Ambulatory Visit: Payer: Self-pay

## 2020-01-03 ENCOUNTER — Ambulatory Visit (HOSPITAL_COMMUNITY): Payer: No Typology Code available for payment source | Attending: Family Medicine | Admitting: Physical Therapy

## 2020-01-03 ENCOUNTER — Encounter (HOSPITAL_COMMUNITY): Payer: Self-pay | Admitting: Physical Therapy

## 2020-01-03 DIAGNOSIS — R2689 Other abnormalities of gait and mobility: Secondary | ICD-10-CM | POA: Insufficient documentation

## 2020-01-03 DIAGNOSIS — M545 Low back pain, unspecified: Secondary | ICD-10-CM | POA: Diagnosis present

## 2020-01-03 DIAGNOSIS — R29898 Other symptoms and signs involving the musculoskeletal system: Secondary | ICD-10-CM | POA: Insufficient documentation

## 2020-01-03 NOTE — Patient Instructions (Signed)
Access Code: [email or web address] URL: [email or web address] Date: 01/03/2020 Prepared by: Greig Castilla Sharlot Sturkey  Exercises Prone Hip Extension - 1 x daily - 7 x weekly - 2 sets - 10 reps Hooklying Single Knee to Chest Stretch - 1 x daily - 7 x weekly - 3 reps - 20-30 second holds hold Standing massage with ball at wall on low back - 1 x daily - 7 x weekly

## 2020-01-03 NOTE — Therapy (Addendum)
[place], [place], [address] Phone: [phone]   Fax:  [phone]  Physical Therapy Evaluation  Patient Details  Name: Samantha Lucas MRN: [id number] Date of Birth: [birth year] Referring Provider (PT): Vic Blackbird MD   Encounter Date: 01/03/2020  PHYSICAL THERAPY DISCHARGE SUMMARY  Visits from Start of Care: 1  Current functional level related to goals / functional outcomes: Unknown   Remaining deficits: Unknown   Education / Equipment: Unknown    Patient agrees to discharge. Patient goals were not met. Patient is being discharged due to not returning since the last visit.    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    Subjective Assessment - 01/03/20 1040     Subjective Patient is 29 y.o. female who presents to physical therapy with c/o low back pain on L side. She has history of back pain. She was having sciatica symptoms with sitting but that has resolved. She states symptoms with insidious onset. Patient states symptoms worse with bending. Back support makes it better and so does muscles relaxers. She wants to strengthen her back and decrease pain.    Limitations --   bending   Patient Stated Goals get  back stronger    Currently in Pain? No/denies                [facility] PT Assessment - 01/03/20 0001       Assessment   Medical Diagnosis LBP with bilateral sciatica    Referring Provider (PT) Vic Blackbird MD    Onset Date/Surgical Date 12/03/19    Next MD Visit January 2022    Prior Therapy years ago      Precautions   Precautions None      Restrictions   Weight Bearing Restrictions No      Balance Screen   Has the patient fallen in the past 6 months No    Has the patient had a decrease in activity level because of a fear of falling?  No    Is the patient reluctant to leave their home because of a fear of falling?  No      Prior Function   Level of Independence Independent    Vocation Full time employment      Cognition   Overall Cognitive Status Within Functional Limits for tasks assessed      Observation/Other Assessments   Observations Ambulates without AD    Focus on Therapeutic Outcomes (FOTO)  35% limited      ROM / Strength   AROM / PROM / Strength AROM;Strength  AROM   AROM Assessment Site Lumbar    Lumbar Flexion 0% limited    Lumbar Extension 0% limited    Lumbar - Right Side Bend 0% limited    Lumbar - Left Side Bend 0% limited   slight pain in left lower lumbar   Lumbar - Right Rotation 0% limited    Lumbar - Left Rotation 0% limited      Strength   Strength Assessment Site Hip;Knee;Ankle    Right/Left Hip Right;Left    Right Hip Flexion 4/5    Right Hip Extension 4+/5    Right Hip ABduction 4/5    Left Hip Flexion 4/5   pain in low back   Left Hip Extension 4+/5    Left Hip ABduction 4+/5    Right/Left Knee Right;Left    Right Knee Flexion 5/5    Right Knee Extension 5/5    Left Knee Flexion 5/5    Left Knee Extension 5/5    Right/Left Ankle Right;Left      Palpation   Palpation comment TTP L lower lumbar paraspinals/glutes near L PSIS                        Objective measurements completed on examination: See  above findings.       [facility] Adult PT Treatment/Exercise - 01/03/20 0001       Exercises   Exercises Lumbar      Lumbar Exercises: Stretches   Single Knee to Chest Stretch 3 reps;20 seconds;Right;Left      Lumbar Exercises: Prone   Straight Leg Raise 10 reps    Straight Leg Raises Limitations bilateral       Manual Therapy   Manual Therapy Soft tissue mobilization    Manual therapy comments completed independently from all other aspects of treatment    Soft tissue mobilization self STM to L glute/lumbar paraspinals                     PT Education - 01/03/20 1041     Education Details Patient educated on exam findings, POC, scope of PT, initial HEP    Person(s) Educated Patient    Methods Explanation;Demonstration    Comprehension Verbalized understanding;Returned demonstration              PT Short Term Goals - 01/03/20 1122       PT SHORT TERM GOAL #1   Title Patient will be independent with HEP in order to improve functional outcomes.    Time 5    Period Weeks    Status New    Target Date 02/07/20      PT SHORT TERM GOAL #2   Title Patient will report at least 75% improvement in symptoms for improved quality of life.    Time 5    Period Weeks    Status New    Target Date 02/07/20      PT SHORT TERM GOAL #3   Title Patient will demonstrate 5/5 MMT throughout hips to demonstrate improved strength for work.    Time 5    Period Weeks    Status New    Target Date 02/07/20                       Plan - 01/03/20 1119     Clinical Impression Statement Patient is 29 y.o. female who presents to physical therapy with c/o low back pain on L side.  She presents with pain limited deficits in LE strength, ROM, postural impairments, tender musculature, and functional mobility with ADL. She is having to modify and restrict ADL as indicated by FOTO score as well as subjective information and objective measures which is affecting overall  participation. Patient will benefit from skilled physical therapy in order to improve function and reduce impairment.    Personal Factors and Comorbidities Fitness;Comorbidity 1;Finances;Other   work   Comorbidities DM    Examination-Activity Limitations Caring for Others;Bend;Lift    Examination-Participation Restrictions Occupation;Cleaning;Yard Work    Stability/Clinical Decision Making Stable/Uncomplicated    Designer, jewellery Low    Rehab Potential Good    PT Frequency Other (comment)   1 follow up in 4-5 weeks   PT Duration Other (comment)   1 follow up in 4-5 weeks   PT Treatment/Interventions ADLs/Self Care Home Management;Neuromuscular re-education;Gait training;Stair training;Functional mobility training;Therapeutic activities;Therapeutic exercise;Balance training;Spinal Manipulations;Joint Manipulations;Dry needling;Energy conservation;Patient/family education    PT Next Visit Plan assess response to HEP, possibly d/c, add core strengthening exercises for HEP    PT Home Exercise Plan self STM, prone hip ext, SKTC    Consulted and Agree with Plan of Care Patient             Patient will benefit from skilled therapeutic intervention in order to improve the following deficits and impairments:  Decreased endurance, Increased muscle spasms, Pain, Decreased activity tolerance, Decreased strength, Decreased mobility  Visit Diagnosis: Low back pain, unspecified back pain laterality, unspecified chronicity, unspecified whether sciatica present  Other abnormalities of gait and mobility  Other symptoms and signs involving the musculoskeletal system     Problem List Patient Active Problem List   Diagnosis Date Noted   Vitamin D deficiency 12/27/2019   At risk for heart disease 12/27/2019   Diabetes mellitus ([place]) 11/27/2019   At risk for dehydration 11/27/2019   At risk for constipation 11/13/2019   Routine general medical examination at a health care facility  11/14/2013   Microcytic anemia 07/17/2013   Candidiasis, intertrigo 06/24/2012   Type 2 diabetes mellitus with hyperglycemia ([place]) 06/24/2012   Class 2 obesity 03/23/2011   SCOLIOSIS-IDIOPATHIC 09/05/2007    11:25 AM, 01/03/20 [name] PT, DPT Physical Therapist at [place], [place], [address] Phone: [phone]   Fax:  [phone]  Name: Samantha Lucas MRN: [id number] Date of Birth: January 03, 1991

## 2020-01-16 ENCOUNTER — Encounter (INDEPENDENT_AMBULATORY_CARE_PROVIDER_SITE_OTHER): Payer: Self-pay | Admitting: Family Medicine

## 2020-01-16 ENCOUNTER — Ambulatory Visit (INDEPENDENT_AMBULATORY_CARE_PROVIDER_SITE_OTHER): Payer: No Typology Code available for payment source | Admitting: Family Medicine

## 2020-01-16 ENCOUNTER — Other Ambulatory Visit (INDEPENDENT_AMBULATORY_CARE_PROVIDER_SITE_OTHER): Payer: Self-pay | Admitting: Family Medicine

## 2020-01-16 ENCOUNTER — Other Ambulatory Visit: Payer: Self-pay

## 2020-01-16 VITALS — BP 108/72 | HR 81 | Temp 98.0°F | Ht 69.0 in | Wt 240.0 lb

## 2020-01-16 DIAGNOSIS — Z9189 Other specified personal risk factors, not elsewhere classified: Secondary | ICD-10-CM | POA: Diagnosis not present

## 2020-01-16 DIAGNOSIS — Z794 Long term (current) use of insulin: Secondary | ICD-10-CM

## 2020-01-16 DIAGNOSIS — Z6835 Body mass index (BMI) 35.0-35.9, adult: Secondary | ICD-10-CM

## 2020-01-16 DIAGNOSIS — E559 Vitamin D deficiency, unspecified: Secondary | ICD-10-CM

## 2020-01-16 DIAGNOSIS — E1169 Type 2 diabetes mellitus with other specified complication: Secondary | ICD-10-CM

## 2020-01-16 MED ORDER — TRULICITY 3 MG/0.5ML ~~LOC~~ SOAJ: 3.0000 mg | SUBCUTANEOUS | 0 refills | Status: DC

## 2020-01-16 MED ORDER — VITAMIN D (ERGOCALCIFEROL) 1.25 MG (50000 UNIT) PO CAPS: 50000.0000 [IU] | ORAL_CAPSULE | ORAL | 0 refills | Status: DC

## 2020-01-16 NOTE — Progress Notes (Signed)
Chief Complaint:   OBESITY Samantha Lucas is here to discuss her progress with her obesity treatment plan along with follow-up of her obesity related diagnoses. Samantha Lucas is on the Category 2 Plan with 200 snacks calories and 10:1 ratio and states she is following her eating plan approximately 100% of the time. Samantha Lucas states she is doing YouTube videos 15-30 minutes 2-3 times per week.  Today's visit was #: 10 Starting weight: 243 lbs Starting date: 07/18/2019 Today's weight: 240 lbs Today's date: 01/17/2020 Total lbs lost to date: 3 lbs Total lbs lost since last in-office visit: 3 lbs Total weight loss percentage to date: -1.23%  Interim History: Samantha Lucas is consistently eating 3 meals a day and drinking more water. She denies hunger or cravings and has no issues with the plan. She is doing much better with eating foods on the plan and is not skipping meals. Samantha Lucas reports little to no "off-plan" eating.   Assessment/Plan:   1. Type 2 diabetes mellitus with other specified complication, with long-term current use of insulin (HCC) Discussed labs with patient today. Samantha Lucas reports her "sugars have been great." Her fasting blood sugars range from 70's-110's. She has no symptoms of lows like she normally would is she were in the 70's. Samantha Lucas has been more consistent with taking all diabetes medications.  Plan: Refill Trulicity for 1 month, as per below. Continue prudent nutritional plan, weight loss, and exercise. Be vigilant with blood sugars and keep a log.  Refill- Dulaglutide (TRULICITY) 3 MG/0.5ML SOPN; Inject 3 mg as directed once a week.  Dispense: 2 mL; Refill: 0  2. Vitamin D deficiency Samantha Lucas's Vitamin D level was 47.6 on 10/30/2019. She is currently taking prescription vitamin D 50,000 IU each week. She denies nausea, vomiting or muscle weakness.   Ref. Range 10/30/2019 12:28  Vitamin D, 25-Hydroxy Latest Ref Range: 30.0 - 100.0 ng/mL 47.6   Plan: Refill Vit D for 1 month, as per below. Low  Vitamin D level contributes to fatigue and are associated with obesity, breast, and colon cancer. She agrees to continue to take prescription Vitamin D @50 ,000 IU every week and will follow-up for routine testing of Vitamin D, at least 2-3 times per year to avoid over-replacement.  Refill- Vitamin D, Ergocalciferol, (DRISDOL) 1.25 MG (50000 UNIT) CAPS capsule; Take 1 capsule (50,000 Units total) by mouth every 7 (seven) days.  Dispense: 4 capsule; Refill: 0  3. At risk for hypoglycemia Samantha Lucas was given approximately 15 minutes of counseling today regarding prevention of hypoglycemia. She was advised of symptoms of hypoglycemia. Samantha Lucas was instructed to avoid skipping meals, and to eat regular protein-rich meals as prescribed on the meal plan. Have readily available- low calorie snacks as needed ie- [employer] fruit snack packs if BS goes too low.   4. Class 2 severe obesity with serious comorbidity and body mass index (BMI) of 35.0 to 35.9 in adult, unspecified obesity type (HCC) Samantha Lucas is currently in the action stage of change. As such, her goal is to continue with weight loss efforts. She has agreed to the Category 2 Plan with 200 snacks calories and 10:1 ratio.   Exercise goals: For substantial health benefits, adults should do at least 150 minutes (2 hours and 30 minutes) a week of moderate-intensity, or 75 minutes (1 hour and 15 minutes) a week of vigorous-intensity aerobic physical activity, or an equivalent combination of moderate- and vigorous-intensity aerobic activity. Aerobic activity should be performed in episodes of at least 10 minutes, and preferably,  it should be spread throughout the week.  Behavioral modification strategies: holiday eating strategies , celebration eating strategies and avoiding temptations.  Samantha Lucas has agreed to follow-up with our clinic in 2 weeks. She was informed of the importance of frequent follow-up visits to maximize her success with intensive lifestyle modifications  for her multiple health conditions.   Samantha Lucas was informed we would discuss her lab results at her next visit unless there is a critical issue that needs to be addressed sooner. Samantha Lucas agreed to keep her next visit at the agreed upon time to discuss these results.  Objective:   Blood pressure 108/72, pulse 81, temperature 98 F (36.7 C), height 5\' 9"  (1.753 m), weight 240 lb (108.9 kg), SpO2 97 %. Body mass index is 35.44 kg/m.  General: Cooperative, alert, well developed, in no acute distress. HEENT: Conjunctivae and lids unremarkable. Cardiovascular: Regular rhythm.  Lungs: Normal work of breathing. Neurologic: No focal deficits.   Lab Results  Component Value Date   CREATININE 0.60 01/16/2020   BUN 16 01/16/2020   NA 138 01/16/2020   K 4.4 01/16/2020   CL 101 01/16/2020   CO2 23 01/16/2020   Lab Results  Component Value Date   ALT 10 01/16/2020   AST 11 01/16/2020   ALKPHOS 127 (H) 01/16/2020   BILITOT 0.3 01/16/2020   Lab Results  Component Value Date   HGBA1C 8.1 (H) 01/16/2020   HGBA1C 8.4 (H) 10/04/2019   HGBA1C 12.6 (H) 06/06/2019   HGBA1C 11.1 (H) 10/17/2018   HGBA1C 13.2 (H) 09/14/2018   No results found for: INSULIN Lab Results  Component Value Date   TSH 2.100 07/18/2019   Lab Results  Component Value Date   CHOL 159 10/04/2019   HDL 44 (L) 10/04/2019   LDLCALC 99 10/04/2019   TRIG 70 10/04/2019   CHOLHDL 3.6 10/04/2019   Lab Results  Component Value Date   WBC 9.5 10/04/2019   HGB 13.0 10/04/2019   HCT 41.2 10/04/2019   MCV 81.1 10/04/2019   PLT 369 10/04/2019   Lab Results  Component Value Date   IRON 18 (L) 07/17/2013   Attestation Statements:   Reviewed by clinician on day of visit: allergies, medications, problem list, medical history, surgical history, family history, social history, and previous encounter notes.  07/19/2013, am acting as Edmund Hilda for Energy manager, DO.  I have reviewed the above documentation  for accuracy and completeness, and I agree with the above. [employer], D.O.  The 21st Century Cures Act was signed into law in 2016 which includes the topic of electronic health records.  This provides immediate access to information in MyChart.  This includes consultation notes, operative notes, office notes, lab results and pathology reports.  If you have any questions about what you read please let 2017 know at your next visit so we can discuss your concerns and take corrective action if need be.  We are right here with you.

## 2020-01-17 LAB — COMPREHENSIVE METABOLIC PANEL
ALT: 10 IU/L (ref 0–32)
AST: 11 IU/L (ref 0–40)
Albumin/Globulin Ratio: 1.3 (ref 1.2–2.2)
Albumin: 4.2 g/dL (ref 3.9–5.0)
Alkaline Phosphatase: 127 IU/L — ABNORMAL HIGH (ref 44–121)
BUN/Creatinine Ratio: 27 — ABNORMAL HIGH (ref 9–23)
BUN: 16 mg/dL (ref 6–20)
Bilirubin Total: 0.3 mg/dL (ref 0.0–1.2)
CO2: 23 mmol/L (ref 20–29)
Calcium: 9.2 mg/dL (ref 8.7–10.2)
Chloride: 101 mmol/L (ref 96–106)
Creatinine, Ser: 0.6 mg/dL (ref 0.57–1.00)
GFR calc Af Amer: 144 mL/min/{1.73_m2} (ref >59)
GFR calc non Af Amer: 125 mL/min/{1.73_m2} (ref >59)
Globulin, Total: 3.2 g/dL (ref 1.5–4.5)
Glucose: 126 mg/dL — ABNORMAL HIGH (ref 65–99)
Potassium: 4.4 mmol/L (ref 3.5–5.2)
Sodium: 138 mmol/L (ref 134–144)
Total Protein: 7.4 g/dL (ref 6.0–8.5)

## 2020-01-17 LAB — HEMOGLOBIN A1C
Est. average glucose Bld gHb Est-mCnc: 186 mg/dL
Hgb A1c MFr Bld: 8.1 % — ABNORMAL HIGH (ref 4.8–5.6)

## 2020-01-22 ENCOUNTER — Telehealth: Payer: Self-pay | Admitting: Family Medicine

## 2020-01-22 NOTE — Telephone Encounter (Signed)
Please schedule appointment. She can see Shanda Bumps, NP.

## 2020-01-22 NOTE — Telephone Encounter (Signed)
UTI burning, cloudy urine

## 2020-01-31 ENCOUNTER — Other Ambulatory Visit: Payer: Self-pay | Admitting: Family Medicine

## 2020-02-01 ENCOUNTER — Other Ambulatory Visit: Payer: Self-pay

## 2020-02-01 ENCOUNTER — Other Ambulatory Visit: Payer: Self-pay | Admitting: Nurse Practitioner

## 2020-02-01 ENCOUNTER — Encounter: Payer: Self-pay | Admitting: Nurse Practitioner

## 2020-02-01 ENCOUNTER — Ambulatory Visit: Payer: No Typology Code available for payment source | Admitting: Nurse Practitioner

## 2020-02-01 VITALS — BP 122/80 | HR 89 | Temp 97.7°F | Ht 69.0 in | Wt 245.4 lb

## 2020-02-01 DIAGNOSIS — R300 Dysuria: Secondary | ICD-10-CM | POA: Insufficient documentation

## 2020-02-01 DIAGNOSIS — M5441 Lumbago with sciatica, right side: Secondary | ICD-10-CM

## 2020-02-01 DIAGNOSIS — M5442 Lumbago with sciatica, left side: Secondary | ICD-10-CM

## 2020-02-01 LAB — URINALYSIS, ROUTINE W REFLEX MICROSCOPIC
Bilirubin Urine: NEGATIVE
Glucose, UA: NEGATIVE
Hyaline Cast: NONE SEEN /LPF
Ketones, ur: NEGATIVE
Nitrite: NEGATIVE
Protein, ur: NEGATIVE
Specific Gravity, Urine: 1.025 (ref 1.001–1.03)
pH: 5.5 (ref 5.0–8.0)

## 2020-02-01 LAB — MICROSCOPIC MESSAGE

## 2020-02-01 MED ORDER — NITROFURANTOIN MONOHYD MACRO 100 MG PO CAPS: 100.0000 mg | ORAL_CAPSULE | Freq: Two times a day (BID) | ORAL | 0 refills | Status: DC

## 2020-02-01 NOTE — Assessment & Plan Note (Signed)
Acute, ongoing.  Dipstick in office showed 3+ blood and 3+ leukocytes, although patient is on menses.  Microscopy showed moderate WBC and bacteria; no casts or crystals.  Will send for culture and in meantime, treat cystitis with macrobid x 7 days. Encouraged to eat with yogurt to prevent diarrhea.  With nausea/vomiting and unable to keep fluids down, go to ER.

## 2020-02-01 NOTE — Patient Instructions (Signed)

## 2020-02-01 NOTE — Assessment & Plan Note (Addendum)
Acute on chronic.  Reports similar problem late last year that got better with Physical Therapy.  I do not think this is related to urinary symptoms as the pain is bilateral and near SI joint.  Treat with back exercises, stretching, and can return to physical therapy as desired.  Use support during long car rides.  If pain worsens, return to clinic.

## 2020-02-01 NOTE — Progress Notes (Signed)
Subjective:    Patient ID: Samantha Lucas, female    DOB: December 14, 1990, 30 y.o.   MRN: [id number]  HPI: Samantha Lucas is a 30 y.o. female presenting for urinary tract infection symptoms.  Chief Complaint  Patient presents with  . Urinary Tract Infection  . Dysuria    Onset for one month ago off and on, pt is on cycle, having some pain while urination and back pain. Not sure if she caused this by keeping pad on too long while working. having pain and odor sometime while urination as well.   URINARY SYMPTOMS Duration: started in December; burning has gone away.  Dysuria: no Urinary frequency: yes Urgency: yes Small volume voids: no Symptom severity: moderate Urinary incontinence: no Foul odor: yes Hematuria: no Abdominal pain: no Back pain: yes Suprapubic pain/pressure: no Flank pain: yes Fever:  No Nausea: no Vomiting: no Relief with cranberry juice: yes Relief with pyridium: no Status: stable  Previous urinary tract infection: no Recurrent urinary tract infection: no Sexual activity: no History of sexually transmitted disease: no Vaginal discharge: no Treatments attempted: water, cranberry   BACK PAIN Duration: chronic, got better, and came back Monday after a long car ride Sunday - 7 hours Mechanism of injury: unknown Location: bilateral Onset: sudden Severity: 8/10 Quality: squeezing Frequency: constant Radiation: yes; radiation down both legs to upper thigh Aggravating factors: nothing Alleviating factors: nothing Status: worse Treatments attempted:  Muscle relaxer, pillows, icy hot, biofreeze Relief with NSAIDs?: not tried Nighttime pain:  no Paresthesias / decreased sensation:  no Bowel / bladder incontinence:  no Fevers:  no Dysuria / urinary frequency:  yes  Allergies  Allergen Reactions  . Metformin And Related     GI upset   . Other     [place]     Outpatient Encounter Medications as of 02/01/2020  Medication Sig  . cyclobenzaprine  (FLEXERIL) 10 MG tablet Take 1 tablet (10 mg total) by mouth at bedtime.  . Dulaglutide (TRULICITY) 3 MG/0.5ML SOPN Inject 3 mg as directed once a week.  . fluticasone (FLONASE) 50 MCG/ACT nasal spray Place 2 sprays into both nostrils daily.  Marland Kitchen glipiZIDE (GLUCOTROL) 5 MG tablet Take 1 tablet (5 mg total) by mouth 2 (two) times daily before a meal.  . Glucose Blood (BLOOD GLUCOSE TEST STRIPS) STRP Use as directed to monitor FSBS 2x daily. Dx: E11.9.  . guaiFENesin-codeine (ROBITUSSIN AC) 100-10 MG/5ML syrup Virtussin AC 10 mg-100 mg/5 mL oral liquid  . HYDROcodone-acetaminophen (NORCO) 5-325 MG tablet Take 1 tablet by mouth every 6 (six) hours as needed for moderate pain.  . Insulin Glargine (BASAGLAR KWIKPEN) 100 UNIT/ML Inject  20 units at bedtime (Patient taking differently: Inject  10 units at bedtime)  . Insulin Pen Needle (BD PEN NEEDLE NANO U/F) 32G X 4 MM MISC USE AS DIRECTED WITH BASAGLAR  . lisinopril (ZESTRIL) 2.5 MG tablet TAKE 1 TABLET BY MOUTH EVERY DAY TO PROTECT KIDNEYS  . meloxicam (MOBIC) 7.5 MG tablet Take 1 tablet (7.5 mg total) by mouth daily.  . methocarbamol (ROBAXIN) 500 MG tablet Take 1 tablet (500 mg total) by mouth every 8 (eight) hours as needed for muscle spasms.  . Multiple Vitamin (MULITIVITAMIN WITH MINERALS) TABS Take 1 tablet by mouth daily.  . nitrofurantoin, macrocrystal-monohydrate, (MACROBID) 100 MG capsule Take 1 capsule (100 mg total) by mouth 2 (two) times daily.  Marland Kitchen omeprazole (PRILOSEC) 20 MG capsule TAKE 1 CAPSULE(20 MG) BY MOUTH DAILY  . vitamin C (ASCORBIC  ACID) 500 MG tablet Take 500 mg by mouth daily.  . Vitamin D, Ergocalciferol, (DRISDOL) 1.25 MG (50000 UNIT) CAPS capsule Take 1 capsule (50,000 Units total) by mouth every 7 (seven) days.   No facility-administered encounter medications on file as of 02/01/2020.    Patient Active Problem List   Diagnosis Date Noted  . Dysuria 02/01/2020  . At risk for hypoglycemia 01/16/2020  . Vitamin D  deficiency 12/27/2019  . At risk for heart disease 12/27/2019  . Diabetes mellitus ([place]) 11/27/2019  . At risk for dehydration 11/27/2019  . At risk for constipation 11/13/2019  . Routine general medical examination at a health care facility 11/14/2013  . Microcytic anemia 07/17/2013  . Candidiasis, intertrigo 06/24/2012  . Type 2 diabetes mellitus with hyperglycemia ([place]) 06/24/2012  . Acute bilateral low back pain with bilateral sciatica 03/23/2011  . Class 2 obesity 03/23/2011  . SCOLIOSIS-IDIOPATHIC 09/05/2007    Past Medical History:  Diagnosis Date  . Back pain   . Diabetes mellitus without complication [facility]) May 1025  . Scoliosis (and kyphoscoliosis), idiopathic   . 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 Interim medical history since our last visit reviewed.  Review of Systems  Constitutional: Negative.  Negative for diaphoresis, fatigue and fever.  Gastrointestinal: Negative.  Negative for abdominal pain and blood in stool.  Genitourinary: Positive for dysuria, frequency, urgency and vaginal bleeding (currently on menses). Negative for decreased urine volume, difficulty urinating, flank pain, hematuria and menstrual problem.  Musculoskeletal: Positive for back pain. Negative for neck pain.  Skin: Negative.  Negative for color change and rash.  Neurological: Negative.   Psychiatric/Behavioral: Negative.     Per HPI unless specifically indicated above     Objective:    BP 122/80   Pulse 89   Temp 97.7 F (36.5 C)   Ht 5\' 9"  (1.753 m)   Wt 245 lb 6.4 oz (111.3 kg)   LMP 02/07/2019   SpO2 100%   BMI 36.24 kg/m   Wt Readings from Last 3 Encounters:  02/01/20 245 lb 6.4 oz (111.3 kg)  01/16/20 240 lb (108.9 kg)  12/26/19 243 lb (110.2 kg)    Physical Exam Vitals and nursing note reviewed.  Constitutional:      General: She is not in acute distress.    Appearance:  Normal appearance. She is not toxic-appearing.  Abdominal:     General: Abdomen is flat. Bowel sounds are normal. There is no distension.     Palpations: Abdomen is soft.  Musculoskeletal:        General: Normal range of motion.       Back:     Comments: Tenderness with deep palpation to areas marked with red circle  Skin:    General: Skin is warm and dry.     Coloration: Skin is not jaundiced or pale.     Findings: No erythema.  Neurological:     Mental Status: She is alert.     Motor: No weakness.     Gait: Gait normal.  Psychiatric:        Mood and Affect: Mood normal.        Behavior: Behavior normal.        Thought Content: Thought content normal.        Judgment: Judgment normal.       Assessment & Plan:   Problem List Items Addressed This Visit  Nervous and Auditory   Acute bilateral low back pain with bilateral sciatica    Acute on chronic.  Reports similar problem late last year that got better with Physical Therapy.  I do not think this is related to urinary symptoms as the pain is bilateral and near SI joint.  Treat with back exercises, stretching, and can return to physical therapy as desired.  Use support during long car rides.  If pain worsens, return to clinic.        Other   Dysuria - Primary    Acute, ongoing.  Dipstick in office showed 3+ blood and 3+ leukocytes, although patient is on menses.  Microscopy showed moderate WBC and bacteria; no casts or crystals.  Will send for culture and in meantime, treat cystitis with macrobid x 7 days. Encouraged to eat with yogurt to prevent diarrhea.  With nausea/vomiting and unable to keep fluids down, go to ER.      Relevant Orders   Urinalysis, Routine w reflex microscopic (Completed)   Urine Culture       Follow up plan: Return if symptoms worsen or fail to improve.

## 2020-02-03 LAB — URINE CULTURE
MICRO NUMBER:: 11389951
SPECIMEN QUALITY:: ADEQUATE

## 2020-02-05 ENCOUNTER — Ambulatory Visit: Payer: No Typology Code available for payment source | Admitting: Family Medicine

## 2020-02-07 ENCOUNTER — Encounter (INDEPENDENT_AMBULATORY_CARE_PROVIDER_SITE_OTHER): Payer: Self-pay

## 2020-02-08 ENCOUNTER — Other Ambulatory Visit: Payer: Self-pay

## 2020-02-08 ENCOUNTER — Telehealth (INDEPENDENT_AMBULATORY_CARE_PROVIDER_SITE_OTHER): Payer: No Typology Code available for payment source | Admitting: Family Medicine

## 2020-02-08 ENCOUNTER — Other Ambulatory Visit (INDEPENDENT_AMBULATORY_CARE_PROVIDER_SITE_OTHER): Payer: Self-pay | Admitting: Family Medicine

## 2020-02-08 ENCOUNTER — Encounter (INDEPENDENT_AMBULATORY_CARE_PROVIDER_SITE_OTHER): Payer: Self-pay | Admitting: Family Medicine

## 2020-02-08 ENCOUNTER — Telehealth (INDEPENDENT_AMBULATORY_CARE_PROVIDER_SITE_OTHER): Payer: Self-pay

## 2020-02-08 DIAGNOSIS — E559 Vitamin D deficiency, unspecified: Secondary | ICD-10-CM | POA: Diagnosis not present

## 2020-02-08 DIAGNOSIS — E1169 Type 2 diabetes mellitus with other specified complication: Secondary | ICD-10-CM | POA: Diagnosis not present

## 2020-02-08 DIAGNOSIS — Z794 Long term (current) use of insulin: Secondary | ICD-10-CM | POA: Diagnosis not present

## 2020-02-08 DIAGNOSIS — Z9189 Other specified personal risk factors, not elsewhere classified: Secondary | ICD-10-CM | POA: Diagnosis not present

## 2020-02-08 DIAGNOSIS — E66812 Obesity, class 2: Secondary | ICD-10-CM

## 2020-02-08 DIAGNOSIS — Z6835 Body mass index (BMI) 35.0-35.9, adult: Secondary | ICD-10-CM

## 2020-02-08 MED ORDER — TRULICITY 3 MG/0.5ML ~~LOC~~ SOAJ: 3.0000 mg | SUBCUTANEOUS | 0 refills | Status: DC

## 2020-02-08 MED ORDER — VITAMIN D (ERGOCALCIFEROL) 1.25 MG (50000 UNIT) PO CAPS: 50000.0000 [IU] | ORAL_CAPSULE | ORAL | 0 refills | Status: DC

## 2020-02-08 NOTE — Telephone Encounter (Signed)
Verbal consent obtained to conduct video visit, via telehealth 

## 2020-02-12 NOTE — Progress Notes (Signed)
TeleHealth Visit:  Due to the COVID-19 pandemic, this visit was completed with telemedicine (audio/video) technology to reduce patient and provider exposure as well as to preserve personal protective equipment.   Samantha Lucas has verbally consented to this TeleHealth visit. The patient is located at home, the provider is located at the [facility] office. The participants in this visit include the listed provider and patient. The visit was conducted today via video.  Chief Complaint: OBESITY Samantha Lucas is here to discuss her progress with her obesity treatment plan along with follow-up of her obesity related diagnoses. Samantha Lucas is on the Category 2 Plan and states she is following her eating plan approximately 90% of the time. Samantha Lucas states she is exercising 0 minutes 0 times per week due to back pain.  Today's visit was #: 11 Starting weight: 243 lbs Starting date: 07/18/2019  Interim History: Samantha Lucas reports everything is going well. She has no issues with foods/meal plan. Cravings and hunger are well controlled. Patient recently found out her sister, who is 37 years old, also has poorly controlled blood sugar, and they committed to going to the gym together and patient is helping her sister with diet.  Assessment/Plan:   1. Type 2 diabetes mellitus with other specified complication, with long-term current use of insulin (HCC) Samantha Lucas reports highest fasting blood sugar 154 and lowest 96. She denies concerns or symptoms. Tolerating Trulicity well and patient feels it helps with hunger. She is prescribed Basaglar and glipizide by Endo and Trulicity by [place]. Lab Results  Component Value Date   HGBA1C 8.1 (H) 01/16/2020   Plan: Refill Trulicity for 1 month, as per below. Continue home blood sugar monitoring, increase exercise as tolerated, and follow  Meal plan.  Refill- Dulaglutide (TRULICITY) 3 MG/0.5ML SOPN; Inject 3 mg as directed once a week.  Dispense: 2 mL; Refill: 0  2. Vitamin D  deficiency Samantha Lucas's Vitamin D level was 47.6 on 10/30/2019. She is currently taking prescription vitamin D 50,000 IU each week. She denies nausea, vomiting or muscle weakness.  Ref. Range 10/30/2019 12:28  Vitamin D, 25-Hydroxy Latest Ref Range: 30.0 - 100.0 ng/mL 47.6   Plan: Refill Vit D for 1 month, as per below. - Reiterated importance of vitamin D (as well as calcium) to their health and wellbeing.  - Reminded Samantha Lucas that weight loss will likely improve availability of vitamin D, thus encouraged her to continue with meal plan and their weight loss efforts to further improve this condition. - I recommend patient continue to take weekly prescription vit D 50,000 IU - Informed patient this may be a lifelong thing, and she was encouraged to continue to take the medicine until told otherwise.   - we will need to monitor levels regularly (every 3-4 mo on average) to keep levels within normal limits.  - weight loss will likely improve availability of vitamin D, thus encouraged Samantha Lucas to continue with meal plan and their weight loss efforts to further improve this condition - pt's questions and concerns regarding this condition addressed.  Refill- Vitamin D, Ergocalciferol, (DRISDOL) 1.25 MG (50000 UNIT) CAPS capsule; Take 1 capsule (50,000 Units total) by mouth every 7 (seven) days.  Dispense: 4 capsule; Refill: 0  3. At risk for deficient intake of food Samantha Lucas was given approximately 9 minutes of deficit intake of food prevention counseling today. Jenet is at risk for eating too few calories based on current food recall. She was encouraged to focus on meeting caloric and  protein goals according to her recommended meal plan.   4. Class 2 severe obesity with serious comorbidity and body mass index (BMI) of 35.0 to 35.9 in adult, unspecified obesity type (HCC) Samantha Lucas is currently in the action stage of change. As such, her goal is to continue with weight loss efforts. She has agreed to the Category 2  Plan.   Exercise goals: As is until acute episode is resolved.  Behavioral modification strategies: increasing lean protein intake, decreasing simple carbohydrates, increasing water intake and planning for success.  Samantha Lucas has agreed to follow-up with our clinic in 3 weeks on 02/29/2020 at 1240. She was informed of the importance of frequent follow-up visits to maximize her success with intensive lifestyle modifications for her multiple health conditions.  Objective:   VITALS: Per patient if applicable, see vitals. GENERAL: Alert and in no acute distress. CARDIOPULMONARY: No increased WOB. Speaking in clear sentences.  PSYCH: Pleasant and cooperative. Speech normal rate and rhythm. Affect is appropriate. Insight and judgement are appropriate. Attention is focused, linear, and appropriate.  NEURO: Oriented as arrived to appointment on time with no prompting.   Lab Results  Component Value Date   CREATININE 0.60 01/16/2020   BUN 16 01/16/2020   NA 138 01/16/2020   K 4.4 01/16/2020   CL 101 01/16/2020   CO2 23 01/16/2020   Lab Results  Component Value Date   ALT 10 01/16/2020   AST 11 01/16/2020   ALKPHOS 127 (H) 01/16/2020   BILITOT 0.3 01/16/2020   Lab Results  Component Value Date   HGBA1C 8.1 (H) 01/16/2020   HGBA1C 8.4 (H) 10/04/2019   HGBA1C 12.6 (H) 06/06/2019   HGBA1C 11.1 (H) 10/17/2018   HGBA1C 13.2 (H) 09/14/2018   No results found for: INSULIN Lab Results  Component Value Date   TSH 2.100 07/18/2019   Lab Results  Component Value Date   CHOL 159 10/04/2019   HDL 44 (L) 10/04/2019   LDLCALC 99 10/04/2019   TRIG 70 10/04/2019   CHOLHDL 3.6 10/04/2019   Lab Results  Component Value Date   WBC 9.5 10/04/2019   HGB 13.0 10/04/2019   HCT 41.2 10/04/2019   MCV 81.1 10/04/2019   PLT 369 10/04/2019   Lab Results  Component Value Date   IRON 18 (L) 07/17/2013    Attestation Statements:   Reviewed by clinician on day of visit: allergies, medications,  problem list, medical history, surgical history, family history, social history, and previous encounter notes.  Edmund Hilda, am acting as Energy manager for [employer], DO.  I have reviewed the above documentation for accuracy and completeness, and I agree with the above. Carlye Grippe, D.O.  The 21st Century Cures Act was signed into law in 2016 which includes the topic of electronic health records.  This provides immediate access to information in MyChart.  This includes consultation notes, operative notes, office notes, lab results and pathology reports.  If you have any questions about what you read please let us know at your next visit so we can discuss your concerns and take corrective action if need be.  We are right here with you.

## 2020-02-29 ENCOUNTER — Other Ambulatory Visit (INDEPENDENT_AMBULATORY_CARE_PROVIDER_SITE_OTHER): Payer: Self-pay | Admitting: Family Medicine

## 2020-02-29 ENCOUNTER — Encounter (INDEPENDENT_AMBULATORY_CARE_PROVIDER_SITE_OTHER): Payer: Self-pay | Admitting: Family Medicine

## 2020-02-29 ENCOUNTER — Other Ambulatory Visit: Payer: Self-pay

## 2020-02-29 ENCOUNTER — Ambulatory Visit (INDEPENDENT_AMBULATORY_CARE_PROVIDER_SITE_OTHER): Payer: No Typology Code available for payment source | Admitting: Family Medicine

## 2020-02-29 VITALS — BP 117/70 | HR 76 | Temp 98.0°F | Ht 69.0 in | Wt 239.0 lb

## 2020-02-29 DIAGNOSIS — E559 Vitamin D deficiency, unspecified: Secondary | ICD-10-CM | POA: Diagnosis not present

## 2020-02-29 DIAGNOSIS — Z6835 Body mass index (BMI) 35.0-35.9, adult: Secondary | ICD-10-CM

## 2020-02-29 DIAGNOSIS — Z9189 Other specified personal risk factors, not elsewhere classified: Secondary | ICD-10-CM | POA: Insufficient documentation

## 2020-02-29 DIAGNOSIS — E1169 Type 2 diabetes mellitus with other specified complication: Secondary | ICD-10-CM

## 2020-02-29 DIAGNOSIS — Z794 Long term (current) use of insulin: Secondary | ICD-10-CM | POA: Diagnosis not present

## 2020-02-29 MED ORDER — VITAMIN D (ERGOCALCIFEROL) 1.25 MG (50000 UNIT) PO CAPS: 50000.0000 [IU] | ORAL_CAPSULE | ORAL | 0 refills | Status: DC

## 2020-03-05 NOTE — Progress Notes (Signed)
Chief Complaint:   OBESITY Samantha Lucas is here to discuss her progress with her obesity treatment plan along with follow-up of her obesity related diagnoses.   Today's visit was #: 12 Starting weight: 243 lbs Starting date: 07/18/2019 Today's weight: 239 lbs Today's date: 02/29/2020 Total lbs lost to date: 4 lbs Body mass index is 35.29 kg/m.  Total weight loss percentage to date: -1.65%  Interim History:  Samantha Lucas is here for a follow up office visit.  she is following the meal plan without concern or issues.  Patient's meal and food recall appears to be accurate and consistent with what is on the plan.  When on plan, her hunger and cravings are well controlled.    Nutrition Plan: Category 2 Plan for 85% of the time. Anti-obesity medications: Trulicity. Reported side effects: None. Activity: None at this time.    Assessment/Plan:   Medications Discontinued During This Encounter  Medication Reason  . Vitamin D, Ergocalciferol, (DRISDOL) 1.25 MG (50000 UNIT) CAPS capsule Reorder     Meds ordered this encounter  Medications  . Vitamin D, Ergocalciferol, (DRISDOL) 1.25 MG (50000 UNIT) CAPS capsule    Sig: Take 1 capsule (50,000 Units total) by mouth every 7 (seven) days.    Dispense:  4 capsule    Refill:  0     1. Type 2 diabetes mellitus with other specified complication, with long-term current use of insulin (HCC) Diabetes Mellitus: Not at goal.   Medication: Trulicity 3 mg subcutaneously weekly and Glucotrol 5 mg twice daily.  FBS <150s-200s and 110s.  Denies symptoms or concerns.  She says she has more energy now.  Issues reviewed: blood sugar goals, complications of diabetes mellitus, hypoglycemia prevention and treatment, exercise, and nutrition.   Plan: The patient was encouraged to monitor blood sugars regularly and to bring their log to the next appointment for review.  The importance of regular follow up with PCP and all other specialists as scheduled was stressed  to patient today.  Lab Results  Component Value Date   HGBA1C 8.1 (H) 01/16/2020   HGBA1C 8.4 (H) 10/04/2019   HGBA1C 12.6 (H) 06/06/2019   Lab Results  Component Value Date   MICROALBUR <0.2 06/06/2019   LDLCALC 99 10/04/2019   CREATININE 0.60 01/16/2020   2. Vitamin D deficiency Improving, but not optimized. Current vitamin D is 47.6, tested on 10/30/2019. Optimal goal > 50 ng/dL.   Plan: Continue to take prescription Vitamin D @50 ,000 IU every week as prescribed.  Follow-up for routine testing of Vitamin D, at least 2-3 times per year to avoid over-replacement.  - Refill Vitamin D, Ergocalciferol, (DRISDOL) 1.25 MG (50000 UNIT) CAPS capsule; Take 1 capsule (50,000 Units total) by mouth every 7 (seven) days.  Dispense: 4 capsule; Refill: 0  3. At risk for impaired metabolic function Due to [name]'s current state of health and medical condition(s), she is at a significantly higher risk for impaired metabolic function.  This places the patient at a much greater risk to subsequently develop cardiopulmonary conditions that can negatively affect the patient's quality of life.  At least 9 minutes was spent on counseling [name] about these concerns today, and I stressed the importance of reversing these risks factors.  The initial goal is to lose at least 5-10% of starting weight to help reduce risk factors.  Counseling: Intensive lifestyle modifications discussed with [name] as the most appropriate first line treatment.  she will continue to work on diet, exercise, and  weight loss efforts.  We will continue to reassess these conditions on a fairly regular basis in an attempt to decrease the patient's overall morbidity and mortality.  4. Class 2 severe obesity with serious comorbidity and body mass index (BMI) of 35.0 to 35.9 in adult, unspecified obesity type (HCC)  Course: [name] is currently in the action stage of change. As such, her goal is to continue with weight loss efforts.   Nutrition  goals: She has agreed to the Category 2 Plan but change to 6 ounces of protein at lunch  and  8-10 ounces of protein at dinner.   Exercise goals: For substantial health benefits, adults should do at least 150 minutes (2 hours and 30 minutes) a week of moderate-intensity, or 75 minutes (1 hour and 15 minutes) a week of vigorous-intensity aerobic physical activity, or an equivalent combination of moderate- and vigorous-intensity aerobic activity. Aerobic activity should be performed in episodes of at least 10 minutes, and preferably, it should be spread throughout the week.  Behavioral modification strategies: meal planning and cooking strategies, keeping healthy foods in the home, avoiding temptations and planning for success.  [name] has agreed to follow-up with our clinic in 2 weeks. She was informed of the importance of frequent follow-up visits to maximize her success with intensive lifestyle modifications for her multiple health conditions.   Objective:   Blood pressure 117/70, pulse 76, temperature 98 F (36.7 C), height 5\' 9"  (1.753 m), weight 239 lb (108.4 kg), SpO2 97 %. Body mass index is 35.29 kg/m.  General: Cooperative, alert, well developed, in no acute distress. HEENT: Conjunctivae and lids unremarkable. Cardiovascular: Regular rhythm.  Lungs: Normal work of breathing. Neurologic: No focal deficits.   Lab Results  Component Value Date   CREATININE 0.60 01/16/2020   BUN 16 01/16/2020   NA 138 01/16/2020   K 4.4 01/16/2020   CL 101 01/16/2020   CO2 23 01/16/2020   Lab Results  Component Value Date   ALT 10 01/16/2020   AST 11 01/16/2020   ALKPHOS 127 (H) 01/16/2020   BILITOT 0.3 01/16/2020   Lab Results  Component Value Date   HGBA1C 8.1 (H) 01/16/2020   HGBA1C 8.4 (H) 10/04/2019   HGBA1C 12.6 (H) 06/06/2019   HGBA1C 11.1 (H) 10/17/2018   HGBA1C 13.2 (H) 09/14/2018   Lab Results  Component Value Date   TSH 2.100 07/18/2019   Lab Results  Component Value  Date   CHOL 159 10/04/2019   HDL 44 (L) 10/04/2019   LDLCALC 99 10/04/2019   TRIG 70 10/04/2019   CHOLHDL 3.6 10/04/2019   Lab Results  Component Value Date   WBC 9.5 10/04/2019   HGB 13.0 10/04/2019   HCT 41.2 10/04/2019   MCV 81.1 10/04/2019   PLT 369 10/04/2019   Lab Results  Component Value Date   IRON 18 (L) 07/17/2013   Attestation Statements:   Reviewed by clinician on day of visit: allergies, medications, problem list, medical history, surgical history, family history, social history, and previous encounter notes.  I, 07/19/2013, CMA, am acting as Insurance claims handler for Energy manager, DO.  I have reviewed the above documentation for accuracy and completeness, and I agree with the above. [employer], D.O.  The 21st Century Cures Act was signed into law in 2016 which includes the topic of electronic health records.  This provides immediate access to information in MyChart.  This includes consultation notes, operative notes, office notes, lab results and pathology reports.  If you have any questions about what you read please let us know at your next visit so we can discuss your concerns and take corrective action if need be.  We are right here with you.

## 2020-03-21 ENCOUNTER — Encounter (INDEPENDENT_AMBULATORY_CARE_PROVIDER_SITE_OTHER): Payer: Self-pay | Admitting: Family Medicine

## 2020-03-21 ENCOUNTER — Other Ambulatory Visit (INDEPENDENT_AMBULATORY_CARE_PROVIDER_SITE_OTHER): Payer: Self-pay | Admitting: Family Medicine

## 2020-03-21 ENCOUNTER — Ambulatory Visit (INDEPENDENT_AMBULATORY_CARE_PROVIDER_SITE_OTHER): Payer: No Typology Code available for payment source | Admitting: Family Medicine

## 2020-03-21 ENCOUNTER — Other Ambulatory Visit: Payer: Self-pay

## 2020-03-21 VITALS — BP 123/69 | HR 82 | Temp 98.3°F | Ht 69.0 in | Wt 238.0 lb

## 2020-03-21 DIAGNOSIS — E1169 Type 2 diabetes mellitus with other specified complication: Secondary | ICD-10-CM

## 2020-03-21 DIAGNOSIS — Z9189 Other specified personal risk factors, not elsewhere classified: Secondary | ICD-10-CM

## 2020-03-21 DIAGNOSIS — Z794 Long term (current) use of insulin: Secondary | ICD-10-CM | POA: Diagnosis not present

## 2020-03-21 DIAGNOSIS — Z6835 Body mass index (BMI) 35.0-35.9, adult: Secondary | ICD-10-CM

## 2020-03-21 DIAGNOSIS — E559 Vitamin D deficiency, unspecified: Secondary | ICD-10-CM

## 2020-03-21 MED ORDER — VITAMIN D (ERGOCALCIFEROL) 1.25 MG (50000 UNIT) PO CAPS: 50000.0000 [IU] | ORAL_CAPSULE | ORAL | 0 refills | Status: DC

## 2020-03-21 MED ORDER — TRULICITY 3 MG/0.5ML ~~LOC~~ SOAJ: 3.0000 mg | SUBCUTANEOUS | 0 refills | Status: DC

## 2020-03-25 NOTE — Progress Notes (Signed)
Chief Complaint:   OBESITY Samantha Lucas is here to discuss her progress with her obesity treatment plan along with follow-up of her obesity related diagnoses.   Today's visit was #: 13 Starting weight: 243 lbs Starting date: 07/18/2019 Today's weight: 238 lbs Today's date: 03/21/2020 Total lbs lost to date: 5 lbs Body mass index is 35.15 kg/m.  Total weight loss percentage to date: -2.06%  Interim History:  Samantha Lucas says that she is doing breakfast and lunch on plan.  Not eating well at dinnertime.  Fast food is her go to for dinner.  She knows she needs to meal prep more and be intentional with meal planning.  Current Meal Plan: the Category 2 Plan with 6 ounces of protein at lunch and 8-10 ounces at dinner for 75% of the time.  Current Exercise Plan: Walking for 60 minutes 1 time per week. Current Anti-Obesity Medications: Trulicity 3 mg subcutaneously weekly. Side effects: None.  Assessment/Plan:   No orders of the defined types were placed in this encounter.   Medications Discontinued During This Encounter  Medication Reason  . Dulaglutide (TRULICITY) 3 MG/0.5ML SOPN Reorder  . Vitamin D, Ergocalciferol, (DRISDOL) 1.25 MG (50000 UNIT) CAPS capsule Reorder     Meds ordered this encounter  Medications  . Dulaglutide (TRULICITY) 3 MG/0.5ML SOPN    Sig: Inject 3 mg as directed once a week.    Dispense:  2 mL    Refill:  0  . Vitamin D, Ergocalciferol, (DRISDOL) 1.25 MG (50000 UNIT) CAPS capsule    Sig: Take 1 capsule (50,000 Units total) by mouth every 7 (seven) days.    Dispense:  4 capsule    Refill:  0     1. Type 2 diabetes mellitus with other specified complication, with long-term current use of insulin (HCC) Diabetes Mellitus: Not at goal. Medication: Trulicity 3 mg subcutaneously weekly, glipizide 5 mg twice daily, Basaglar 10 units at bedtime. FBS 180s-200s.  Per patient, she is eating poorly.  A1c was 8.1 on 01/16/2020.  Issues reviewed: blood sugar goals,  complications of diabetes mellitus, hypoglycemia prevention and treatment, exercise, and nutrition.   Plan: The patient was encouraged to monitor blood sugars regularly and to bring their log to the next appointment for review.  The importance of regular follow up with PCP and all other specialists as scheduled was stressed to patient today.  Will refill Trulicity today, as per below.  Lab Results  Component Value Date   HGBA1C 8.1 (H) 01/16/2020   HGBA1C 8.4 (H) 10/04/2019   HGBA1C 12.6 (H) 06/06/2019   Lab Results  Component Value Date   MICROALBUR <0.2 06/06/2019   LDLCALC 99 10/04/2019   CREATININE 0.60 01/16/2020   - Refill Dulaglutide (TRULICITY) 3 MG/0.5ML SOPN; Inject 3 mg as directed once a week.  Dispense: 2 mL; Refill: 0  2. Vitamin D deficiency Improving, but not optimized. Current vitamin D is 47.6, tested on 10/30/2019. Optimal goal > 50 ng/dL.  She is taking vitamin D 50,000 IU weekly.  Plan: Continue to take prescription Vitamin D @50 ,000 IU every week as prescribed.  Follow-up for routine testing of Vitamin D, at least 2-3 times per year to avoid over-replacement.  - Refill Vitamin D, Ergocalciferol, (DRISDOL) 1.25 MG (50000 UNIT) CAPS capsule; Take 1 capsule (50,000 Units total) by mouth every 7 (seven) days.  Dispense: 4 capsule; Refill: 0  3. At risk for hypoglycemia Samantha Lucas was given approximately 8 minutes of counseling today regarding prevention of hypoglycemia.  She was advised of symptoms of hypoglycemia.  Samantha Lucas was instructed to avoid skipping meals and to eat regular protein-rich meals as prescribed on the meal plan.  The patient should have readily available low calorie snacks as needed, such as [employer] fruit snack packs, if blood sugar goes too low.   4. Class 2 severe obesity with serious comorbidity and body mass index (BMI) of 35.0 to 35.9 in adult, unspecified obesity type (HCC)  Course: Samantha Lucas is currently in the action stage of change. As such, her goal is  to continue with weight loss efforts.   Nutrition goals: She has agreed to the Category 2 Plan with 6 ounces of protein at lunch and 8-10 ounces at dinner.   Exercise goals: Increase exercise.  Behavioral modification strategies: meal planning and cooking strategies, keeping healthy foods in the home, avoiding temptations and planning for success.  Samantha Lucas has agreed to follow-up with our clinic in 3-4 weeks. She was informed of the importance of frequent follow-up visits to maximize her success with intensive lifestyle modifications for her multiple health conditions.   Objective:   Blood pressure 123/69, pulse 82, temperature 98.3 F (36.8 C), height 5\' 9"  (1.753 m), weight 238 lb (108 kg), SpO2 97 %. Body mass index is 35.15 kg/m.  General: Cooperative, alert, well developed, in no acute distress. HEENT: Conjunctivae and lids unremarkable. Cardiovascular: Regular rhythm.  Lungs: Normal work of breathing. Neurologic: No focal deficits.   Lab Results  Component Value Date   CREATININE 0.60 01/16/2020   BUN 16 01/16/2020   NA 138 01/16/2020   K 4.4 01/16/2020   CL 101 01/16/2020   CO2 23 01/16/2020   Lab Results  Component Value Date   ALT 10 01/16/2020   AST 11 01/16/2020   ALKPHOS 127 (H) 01/16/2020   BILITOT 0.3 01/16/2020   Lab Results  Component Value Date   HGBA1C 8.1 (H) 01/16/2020   HGBA1C 8.4 (H) 10/04/2019   HGBA1C 12.6 (H) 06/06/2019   HGBA1C 11.1 (H) 10/17/2018   HGBA1C 13.2 (H) 09/14/2018   Lab Results  Component Value Date   TSH 2.100 07/18/2019   Lab Results  Component Value Date   CHOL 159 10/04/2019   HDL 44 (L) 10/04/2019   LDLCALC 99 10/04/2019   TRIG 70 10/04/2019   CHOLHDL 3.6 10/04/2019   Lab Results  Component Value Date   WBC 9.5 10/04/2019   HGB 13.0 10/04/2019   HCT 41.2 10/04/2019   MCV 81.1 10/04/2019   PLT 369 10/04/2019   Lab Results  Component Value Date   IRON 18 (L) 07/17/2013   Attestation Statements:   Reviewed  by clinician on day of visit: allergies, medications, problem list, medical history, surgical history, family history, social history, and previous encounter notes.  I, 07/19/2013, CMA, am acting as Insurance claims handler for Energy manager, DO.  I have reviewed the above documentation for accuracy and completeness, and I agree with the above. [employer], D.O.  The 21st Century Cures Act was signed into law in 2016 which includes the topic of electronic health records.  This provides immediate access to information in MyChart.  This includes consultation notes, operative notes, office notes, lab results and pathology reports.  If you have any questions about what you read please let 2017 know at your next visit so we can discuss your concerns and take corrective action if need be.  We are right here with you.

## 2020-03-27 ENCOUNTER — Telehealth: Payer: Self-pay | Admitting: Family Medicine

## 2020-03-27 ENCOUNTER — Other Ambulatory Visit (HOSPITAL_COMMUNITY): Payer: Self-pay | Admitting: Family Medicine

## 2020-03-27 MED ORDER — TRESIBA 100 UNIT/ML ~~LOC~~ SOLN: 10.0000 [IU] | Freq: Every day | SUBCUTANEOUS | 11 refills | Status: DC

## 2020-03-27 NOTE — Telephone Encounter (Signed)
Agree with change

## 2020-03-27 NOTE — Telephone Encounter (Signed)
Call placed to patient.   Reports that Mariella Saa will no longer be covered.   Alternative is [place].   Prescription sent to pharmacy for tresiba with same directions as Basaglar.

## 2020-03-27 NOTE — Telephone Encounter (Signed)
Pt called concerned that this med Insulin Glargine (BASAGLAR KWIKPEN) 100 UNIT/ML  will be discontinued and wanting to know of an alternative   Cb#: [phone]

## 2020-03-29 NOTE — Telephone Encounter (Signed)
Patient called in states that she has picked up her Evaristo Bury but it was over $70 and she isn't sure she can afford that and she knows that Dr. Jeanice Lim is leaving and she hasn't found another provider.  CB# [phone]

## 2020-04-01 NOTE — Telephone Encounter (Signed)
She was switched to [place] due to basaglar not being covered  She can switch to Lantus or Levemir She may need to call insurance and see which one they prefer

## 2020-04-03 NOTE — Telephone Encounter (Signed)
Patient mother made aware and states that they will contact insurance in AM.

## 2020-04-09 ENCOUNTER — Other Ambulatory Visit: Payer: Self-pay | Admitting: Family Medicine

## 2020-04-10 ENCOUNTER — Encounter (INDEPENDENT_AMBULATORY_CARE_PROVIDER_SITE_OTHER): Payer: Self-pay | Admitting: Family Medicine

## 2020-04-10 ENCOUNTER — Other Ambulatory Visit: Payer: Self-pay

## 2020-04-10 ENCOUNTER — Ambulatory Visit (INDEPENDENT_AMBULATORY_CARE_PROVIDER_SITE_OTHER): Payer: No Typology Code available for payment source | Admitting: Family Medicine

## 2020-04-10 ENCOUNTER — Other Ambulatory Visit (INDEPENDENT_AMBULATORY_CARE_PROVIDER_SITE_OTHER): Payer: Self-pay | Admitting: Family Medicine

## 2020-04-10 VITALS — BP 112/69 | HR 79 | Temp 97.8°F | Ht 69.0 in | Wt 237.0 lb

## 2020-04-10 DIAGNOSIS — Z794 Long term (current) use of insulin: Secondary | ICD-10-CM

## 2020-04-10 DIAGNOSIS — E1169 Type 2 diabetes mellitus with other specified complication: Secondary | ICD-10-CM | POA: Diagnosis not present

## 2020-04-10 DIAGNOSIS — E559 Vitamin D deficiency, unspecified: Secondary | ICD-10-CM | POA: Diagnosis not present

## 2020-04-10 DIAGNOSIS — Z9189 Other specified personal risk factors, not elsewhere classified: Secondary | ICD-10-CM

## 2020-04-10 DIAGNOSIS — Z6835 Body mass index (BMI) 35.0-35.9, adult: Secondary | ICD-10-CM

## 2020-04-10 MED ORDER — VITAMIN D (ERGOCALCIFEROL) 1.25 MG (50000 UNIT) PO CAPS: 50000.0000 [IU] | ORAL_CAPSULE | ORAL | 0 refills | Status: DC

## 2020-04-10 MED ORDER — GLIPIZIDE 5 MG PO TABS: 5.0000 mg | ORAL_TABLET | Freq: Two times a day (BID) | ORAL | 0 refills | Status: DC

## 2020-04-11 DIAGNOSIS — Z9189 Other specified personal risk factors, not elsewhere classified: Secondary | ICD-10-CM | POA: Insufficient documentation

## 2020-04-17 NOTE — Progress Notes (Signed)
Chief Complaint:   OBESITY Samantha Lucas is here to discuss her progress with her obesity treatment plan along with follow-up of her obesity related diagnoses.   Today's visit was #: 14 Starting weight: 243 lbs Starting date: 07/18/2019 Today's weight: 237 lbs Today's date: 04/10/2020 Total lbs lost to date: 6 lbs Body mass index is 35 kg/m.  Total weight loss percentage to date: -2.47%  Interim History:  Samantha Lucas says she has no motivation to exercise/move.  Does not eat all protein as she should.  Drinks 1-2 bottles of water per day.  She states she is disappointed in herself and wants to do better.  Plan:  Focus on increasing water intake and increasing protein intake.  Current Meal Plan: the Category 2 Plan with 6 ounces of protein at lunch and 8-10 ounces at dinner for 80% of the time.  Current Exercise Plan: None. Current Anti-Obesity Medications: Trulicity 3 mg weekly. Side effects: None.  Assessment/Plan:   No orders of the defined types were placed in this encounter.   Medications Discontinued During This Encounter  Medication Reason  . Insulin Glargine (BASAGLAR KWIKPEN) 100 UNIT/ML Not available  . glipiZIDE (GLUCOTROL) 5 MG tablet Reorder  . Vitamin D, Ergocalciferol, (DRISDOL) 1.25 MG (50000 UNIT) CAPS capsule Reorder    Meds ordered this encounter  Medications  . glipiZIDE (GLUCOTROL) 5 MG tablet    Sig: Take 1 tablet (5 mg total) by mouth 2 (two) times daily before a meal.    Dispense:  60 tablet    Refill:  0  . Vitamin D, Ergocalciferol, (DRISDOL) 1.25 MG (50000 UNIT) CAPS capsule    Sig: Take 1 capsule (50,000 Units total) by mouth every 7 (seven) days.    Dispense:  4 capsule    Refill:  0   1. Type 2 diabetes mellitus with other specified complication, with long-term current use of insulin (HCC) Diabetes Mellitus: Not at goal. Medication: Tresiba 10 units at bedtime, Trulicity 3 mg subcutaneously weekly, glipizide 5 mg twice daily. Issues reviewed: blood  sugar goals, complications of diabetes mellitus, hypoglycemia prevention and treatment, exercise, and nutrition.  FBS 108.  Highest 150s.  No concerns.  Plan:  Will refill glipizide today.  Other medications per PCP.  She will ask her PCP for a referral to Endo since she wants to see them, and I think it would be best at this time.  The importance of regular follow up with PCP and all other specialists as scheduled was stressed to patient today.  Lab Results  Component Value Date   HGBA1C 8.1 (H) 01/16/2020   HGBA1C 8.4 (H) 10/04/2019   HGBA1C 12.6 (H) 06/06/2019   Lab Results  Component Value Date   MICROALBUR <0.2 06/06/2019   LDLCALC 99 10/04/2019   CREATININE 0.60 01/16/2020   - Refill glipiZIDE (GLUCOTROL) 5 MG tablet; Take 1 tablet (5 mg total) by mouth 2 (two) times daily before a meal.  Dispense: 60 tablet; Refill: 0  2. Vitamin D deficiency Improving, but not optimized. Current vitamin D is 47.6, tested on 10/30/2019. Optimal goal > 50 ng/dL.  She is taking vitamin D 50,000 IU weekly.  Plan: Continue to take prescription Vitamin D @50 ,000 IU every week as prescribed.  Follow-up for routine testing of Vitamin D, at least 2-3 times per year to avoid over-replacement.  - Refill Vitamin D, Ergocalciferol, (DRISDOL) 1.25 MG (50000 UNIT) CAPS capsule; Take 1 capsule (50,000 Units total) by mouth every 7 (seven) days.  Dispense:  4 capsule; Refill: 0  3. At risk for hyperglycemia Samantha Lucas was given approximately 10 minutes of counseling today regarding detriments to health with hyperglycemia.  She was advised of symptoms of hyperglycemia.  Samantha Lucas was instructed to avoid skipping proteins in meals and eating only carb-rich foods.  The patient should be checking FBS, 2 hr PP BS's and also any time they do not feel well- especially with new onset nausea/ vomiting and abdominal pain, excessive thirst or hunger, tachycardia etc.  Importance of regular follow-ups with their PCP or Endocrinologist, in  addition to our visits was stressed to patient.  4. Class 2 severe obesity with serious comorbidity and body mass index (BMI) of 35.0 to 35.9 in adult, unspecified obesity type (HCC)  Course: Samantha Lucas is currently in the action stage of change. As such, her goal is to continue with weight loss efforts.   Nutrition goals: She has agreed to the Category 2 Plan with 6 ounces of protein at lunch and 8-10 ounces at dinner, except may substitute 1 protein shake for 6 ounces of protein.   Exercise goals: All adults should avoid inactivity. Some physical activity is better than none, and adults who participate in any amount of physical activity gain some health benefits.  Behavioral modification strategies: increasing lean protein intake, decreasing simple carbohydrates, increasing water intake and planning for success.  Samantha Lucas has agreed to follow-up with our clinic in 2-3 weeks. She was informed of the importance of frequent follow-up visits to maximize her success with intensive lifestyle modifications for her multiple health conditions.   Objective:   Blood pressure 112/69, pulse 79, temperature 97.8 F (36.6 C), height 5\' 9"  (1.753 m), weight 237 lb (107.5 kg), SpO2 98 %. Body mass index is 35 kg/m.  General: Cooperative, alert, well developed, in no acute distress. HEENT: Conjunctivae and lids unremarkable. Cardiovascular: Regular rhythm.  Lungs: Normal work of breathing. Neurologic: No focal deficits.   Lab Results  Component Value Date   CREATININE 0.60 01/16/2020   BUN 16 01/16/2020   NA 138 01/16/2020   K 4.4 01/16/2020   CL 101 01/16/2020   CO2 23 01/16/2020   Lab Results  Component Value Date   ALT 10 01/16/2020   AST 11 01/16/2020   ALKPHOS 127 (H) 01/16/2020   BILITOT 0.3 01/16/2020   Lab Results  Component Value Date   HGBA1C 8.1 (H) 01/16/2020   HGBA1C 8.4 (H) 10/04/2019   HGBA1C 12.6 (H) 06/06/2019   HGBA1C 11.1 (H) 10/17/2018   HGBA1C 13.2 (H) 09/14/2018   Lab  Results  Component Value Date   TSH 2.100 07/18/2019   Lab Results  Component Value Date   CHOL 159 10/04/2019   HDL 44 (L) 10/04/2019   LDLCALC 99 10/04/2019   TRIG 70 10/04/2019   CHOLHDL 3.6 10/04/2019   Lab Results  Component Value Date   WBC 9.5 10/04/2019   HGB 13.0 10/04/2019   HCT 41.2 10/04/2019   MCV 81.1 10/04/2019   PLT 369 10/04/2019   Lab Results  Component Value Date   IRON 18 (L) 07/17/2013   Attestation Statements:   Reviewed by clinician on day of visit: allergies, medications, problem list, medical history, surgical history, family history, social history, and previous encounter notes.  I, 07/19/2013, CMA, am acting as Insurance claims handler for Energy manager, DO.  I have reviewed the above documentation for accuracy and completeness, and I agree with the above. [employer], D.O.  The 21st Century Cures Act  was signed into law in 2016 which includes the topic of electronic health records.  This provides immediate access to information in MyChart.  This includes consultation notes, operative notes, office notes, lab results and pathology reports.  If you have any questions about what you read please let us know at your next visit so we can discuss your concerns and take corrective action if need be.  We are right here with you. No orders of the defined types were placed in this encounter.   Medications Discontinued During This Encounter  Medication Reason  . Insulin Glargine (BASAGLAR KWIKPEN) 100 UNIT/ML Not available  . glipiZIDE (GLUCOTROL) 5 MG tablet Reorder  . Vitamin D, Ergocalciferol, (DRISDOL) 1.25 MG (50000 UNIT) CAPS capsule Reorder     Meds ordered this encounter  Medications  . glipiZIDE (GLUCOTROL) 5 MG tablet    Sig: Take 1 tablet (5 mg total) by mouth 2 (two) times daily before a meal.    Dispense:  60 tablet    Refill:  0  . Vitamin D, Ergocalciferol, (DRISDOL) 1.25 MG (50000 UNIT) CAPS capsule    Sig: Take 1 capsule (50,000  Units total) by mouth every 7 (seven) days.    Dispense:  4 capsule    Refill:  0

## 2020-04-24 ENCOUNTER — Other Ambulatory Visit (INDEPENDENT_AMBULATORY_CARE_PROVIDER_SITE_OTHER): Payer: Self-pay | Admitting: Family Medicine

## 2020-04-24 ENCOUNTER — Ambulatory Visit (INDEPENDENT_AMBULATORY_CARE_PROVIDER_SITE_OTHER): Payer: No Typology Code available for payment source | Admitting: Family Medicine

## 2020-04-24 ENCOUNTER — Encounter (INDEPENDENT_AMBULATORY_CARE_PROVIDER_SITE_OTHER): Payer: Self-pay | Admitting: Family Medicine

## 2020-04-24 ENCOUNTER — Other Ambulatory Visit: Payer: Self-pay

## 2020-04-24 VITALS — BP 121/78 | HR 71 | Temp 98.1°F | Ht 69.0 in | Wt 239.0 lb

## 2020-04-24 DIAGNOSIS — Z6836 Body mass index (BMI) 36.0-36.9, adult: Secondary | ICD-10-CM

## 2020-04-24 DIAGNOSIS — E1169 Type 2 diabetes mellitus with other specified complication: Secondary | ICD-10-CM | POA: Diagnosis not present

## 2020-04-24 DIAGNOSIS — Z9189 Other specified personal risk factors, not elsewhere classified: Secondary | ICD-10-CM | POA: Diagnosis not present

## 2020-04-24 DIAGNOSIS — Z794 Long term (current) use of insulin: Secondary | ICD-10-CM | POA: Diagnosis not present

## 2020-04-24 MED ORDER — RYBELSUS 14 MG PO TABS: ORAL_TABLET | ORAL | 0 refills | Status: DC

## 2020-04-30 ENCOUNTER — Other Ambulatory Visit: Payer: Self-pay

## 2020-05-05 NOTE — Progress Notes (Signed)
Chief Complaint:   OBESITY Samantha Lucas is here to discuss her progress with her obesity treatment plan along with follow-up of her obesity related diagnoses.   Today's visit was #: 15 Starting weight: 243 lbs Starting date: 07/18/2019 Today's weight: 239 lbs Today's date: 04/24/2020 Total lbs lost to date: 4 lbs Body mass index is 35.29 kg/m.  Total weight loss percentage to date: -1.65%  Interim History:  Samantha Lucas denies issues or concerns.  She is frustrated that she is not losing weight.  After we discussed changing her plan to journaling, she admitted she does not eat enough protein, skips 1-2 meals per day, and eats off plan foods.  Current Meal Plan: Category 2 Plan with 6 ounces of protein at lunch and 8-10 ounces at dinner for 60% of the time.  Current Exercise Plan:  YouTube videos, floor exercises for 15-30 minutes 3-4 times per week. Current Anti-Obesity Medications: Trulicity 3 mg subcutaneously weekly. Side effects: None.  Assessment/Plan:   No orders of the defined types were placed in this encounter.   Medications Discontinued During This Encounter  Medication Reason  . Dulaglutide (TRULICITY) 3 MG/0.5ML SOPN      Meds ordered this encounter  Medications  . DISCONTD: Semaglutide (RYBELSUS) 14 MG TABS    Sig: 1 po qd    Dispense:  30 tablet    Refill:  0     1. Type 2 diabetes mellitus with other specified complication, with long-term current use of insulin (HCC) Diabetes Mellitus: Not at goal. Medication: Trulicity 3 mg subcutaneously weekly, glipizide 5 mg twice daily, Tresiba 10 units at bedtime.  Issues reviewed: blood sugar goals, complications of diabetes mellitus, hypoglycemia prevention and treatment, exercise, and nutrition.   Plan:  Discontinue Trulicity.   Start Rybelsus 14 mg dailiy.  She will start at 1/2 tab po qd.  Monitor blood sugars daily.  Continue prudent nutritional plan and weight loss (needs to follow plan).  The importance of regular follow  up with PCP and all other specialists as scheduled was stressed to patient today.  Lab Results  Component Value Date   HGBA1C 8.1 (H) 01/16/2020   HGBA1C 8.4 (H) 10/04/2019   HGBA1C 12.6 (H) 06/06/2019   Lab Results  Component Value Date   MICROALBUR <0.2 06/06/2019   LDLCALC 99 10/04/2019   CREATININE 0.60 01/16/2020   2. At risk for hyperglycemia Samantha Lucas was given approximately 10 minutes of counseling today regarding detriments to health with hyperglycemia.  She was advised of symptoms of hyperglycemia.  Samantha Lucas was instructed to avoid skipping proteins in meals and eating only carb-rich foods.  The patient should be checking FBS, 2 hr PP BS's and also any time they do not feel well- especially with new onset nausea/ vomiting and abdominal pain, excessive thirst or hunger, tachycardia etc.  Importance of regular follow-ups with their PCP or Endocrinologist, in addition to our visits was stressed to patient.  3. Obesity, current BMI 35.4  Course: Samantha Lucas is currently in the action stage of change. As such, her goal is to continue with weight loss efforts.   Nutrition goals: She has agreed to keeping a food journal and adhering to recommended goals of 1700 calories and 120+ grams of protein.   Exercise goals: As is.  Behavioral modification strategies: increasing lean protein intake, decreasing simple carbohydrates, decreasing eating out, meal planning and cooking strategies, keeping healthy foods in the home, avoiding temptations and decreasing junk food.  Samantha Lucas has agreed to follow-up with  our clinic in 2-3 weeks. She was informed of the importance of frequent follow-up visits to maximize her success with intensive lifestyle modifications for her multiple health conditions.   Objective:   Blood pressure 121/78, pulse 71, temperature 98.1 F (36.7 C), height 5\' 9"  (1.753 m), weight 239 lb (108.4 kg), SpO2 99 %. Body mass index is 35.29 kg/m.  General: Cooperative, alert, well  developed, in no acute distress. HEENT: Conjunctivae and lids unremarkable. Cardiovascular: Regular rhythm.  Lungs: Normal work of breathing. Neurologic: No focal deficits.   Lab Results  Component Value Date   CREATININE 0.60 01/16/2020   BUN 16 01/16/2020   NA 138 01/16/2020   K 4.4 01/16/2020   CL 101 01/16/2020   CO2 23 01/16/2020   Lab Results  Component Value Date   ALT 10 01/16/2020   AST 11 01/16/2020   ALKPHOS 127 (H) 01/16/2020   BILITOT 0.3 01/16/2020   Lab Results  Component Value Date   HGBA1C 8.1 (H) 01/16/2020   HGBA1C 8.4 (H) 10/04/2019   HGBA1C 12.6 (H) 06/06/2019   HGBA1C 11.1 (H) 10/17/2018   HGBA1C 13.2 (H) 09/14/2018   Lab Results  Component Value Date   TSH 2.100 07/18/2019   Lab Results  Component Value Date   CHOL 159 10/04/2019   HDL 44 (L) 10/04/2019   LDLCALC 99 10/04/2019   TRIG 70 10/04/2019   CHOLHDL 3.6 10/04/2019   Lab Results  Component Value Date   WBC 9.5 10/04/2019   HGB 13.0 10/04/2019   HCT 41.2 10/04/2019   MCV 81.1 10/04/2019   PLT 369 10/04/2019   Lab Results  Component Value Date   IRON 18 (L) 07/17/2013   Attestation Statements:   Reviewed by clinician on day of visit: allergies, medications, problem list, medical history, surgical history, family history, social history, and previous encounter notes.  I, 07/19/2013, CMA, am acting as Insurance claims handler for Energy manager, DO.  I have reviewed the above documentation for accuracy and completeness, and I agree with the above. [employer], D.O.  The 21st Century Cures Act was signed into law in 2016 which includes the topic of electronic health records.  This provides immediate access to information in MyChart.  This includes consultation notes, operative notes, office notes, lab results and pathology reports.  If you have any questions about what you read please let 2017 know at your next visit so we can discuss your concerns and take corrective action if  need be.  We are right here with you.

## 2020-05-07 ENCOUNTER — Telehealth (INDEPENDENT_AMBULATORY_CARE_PROVIDER_SITE_OTHER): Payer: Self-pay | Admitting: Family Medicine

## 2020-05-07 DIAGNOSIS — E559 Vitamin D deficiency, unspecified: Secondary | ICD-10-CM

## 2020-05-08 ENCOUNTER — Ambulatory Visit (INDEPENDENT_AMBULATORY_CARE_PROVIDER_SITE_OTHER): Payer: No Typology Code available for payment source | Admitting: Family Medicine

## 2020-05-08 ENCOUNTER — Other Ambulatory Visit: Payer: Self-pay

## 2020-05-08 MED FILL — Ergocalciferol Cap 1.25 MG (50000 Unit): ORAL | 28 days supply | Qty: 4 | Fill #0 | Status: CN

## 2020-05-08 NOTE — Telephone Encounter (Signed)
Refill request Last vitamin d level 10/30/19  47.6

## 2020-05-08 NOTE — Telephone Encounter (Signed)
Ok x 1

## 2020-05-13 ENCOUNTER — Other Ambulatory Visit: Payer: Self-pay

## 2020-05-13 MED FILL — Lisinopril Tab 2.5 MG: ORAL | 30 days supply | Qty: 30 | Fill #0 | Status: CN

## 2020-05-28 ENCOUNTER — Other Ambulatory Visit: Payer: Self-pay

## 2020-05-29 ENCOUNTER — Encounter (INDEPENDENT_AMBULATORY_CARE_PROVIDER_SITE_OTHER): Payer: Self-pay | Admitting: Family Medicine

## 2020-05-29 ENCOUNTER — Other Ambulatory Visit: Payer: Self-pay

## 2020-05-29 ENCOUNTER — Ambulatory Visit (INDEPENDENT_AMBULATORY_CARE_PROVIDER_SITE_OTHER): Payer: No Typology Code available for payment source | Admitting: Family Medicine

## 2020-05-29 VITALS — BP 129/76 | HR 76 | Temp 97.9°F | Ht 69.0 in | Wt 238.0 lb

## 2020-05-29 DIAGNOSIS — Z9189 Other specified personal risk factors, not elsewhere classified: Secondary | ICD-10-CM | POA: Diagnosis not present

## 2020-05-29 DIAGNOSIS — E1169 Type 2 diabetes mellitus with other specified complication: Secondary | ICD-10-CM

## 2020-05-29 DIAGNOSIS — Z6836 Body mass index (BMI) 36.0-36.9, adult: Secondary | ICD-10-CM

## 2020-05-29 DIAGNOSIS — E559 Vitamin D deficiency, unspecified: Secondary | ICD-10-CM | POA: Diagnosis not present

## 2020-05-29 DIAGNOSIS — Z794 Long term (current) use of insulin: Secondary | ICD-10-CM | POA: Diagnosis not present

## 2020-05-29 MED ORDER — SEMAGLUTIDE 14 MG PO TABS
1.0000 | ORAL_TABLET | Freq: Every day | ORAL | 0 refills | Status: DC
  Filled 2020-05-29: qty 30, 30d supply, fill #0

## 2020-05-29 MED ORDER — VITAMIN D (ERGOCALCIFEROL) 1.25 MG (50000 UNIT) PO CAPS
ORAL_CAPSULE | ORAL | 0 refills | Status: DC
Start: 2020-05-29 — End: 2020-06-26
  Filled 2020-05-29: qty 4, 28d supply, fill #0

## 2020-05-29 MED ORDER — LISINOPRIL 2.5 MG PO TABS
ORAL_TABLET | ORAL | 0 refills | Status: DC
  Filled 2020-05-29: qty 30, 30d supply, fill #0

## 2020-05-29 MED ORDER — GLIPIZIDE 5 MG PO TABS
ORAL_TABLET | Freq: Two times a day (BID) | ORAL | 0 refills | Status: DC
  Filled 2020-05-29: qty 60, 30d supply, fill #0

## 2020-06-03 ENCOUNTER — Other Ambulatory Visit: Payer: Self-pay

## 2020-06-06 NOTE — Progress Notes (Signed)
Chief Complaint:   OBESITY [name] is here to discuss her progress with her obesity treatment plan along with follow-up of her obesity related diagnoses.   Today's visit was #: 16 Starting weight: 243 lbs Starting date: 07/18/2019 Today's weight: 238 lbs Today's date: 05/29/2020 Weight change since last visit: 1 lb Total lbs lost to date: 5 lbs Body mass index is 35.15 kg/m.  Total weight loss percentage to date: -2.06%  Interim History:  Samantha Lucas's last office visit was 4+ weeks ago.  Lost 1 pound since last office visit.  At last office visit we changed the plan to journaling.  She says that she journaled for 50% of the time or less over the last 2 weeks.  She usually ate less than 1000 calories per day and average protein amount was 50 grams per day.  Current Meal Plan: keeping a food journal and adhering to recommended goals of 1700 calories and 120 grams of protein for 80% of the time.  Current Exercise Plan: YouTube videos for 15-30 minutes 2-3 times per week. Current Anti-Obesity Medications: Rybelsus 14 mg daily. Side effects: None.  Assessment/Plan:   No orders of the defined types were placed in this encounter.   Medications Discontinued During This Encounter  Medication Reason  . Semaglutide 14 MG TABS Reorder  . glipiZIDE (GLUCOTROL) 5 MG tablet Reorder  . lisinopril (ZESTRIL) 2.5 MG tablet Reorder  . Vitamin D, Ergocalciferol, (DRISDOL) 1.25 MG (50000 UNIT) CAPS capsule Reorder     Meds ordered this encounter  Medications  . glipiZIDE (GLUCOTROL) 5 MG tablet    Sig: TAKE 1 TABLET BY MOUTH TWICE DAILY BEFORE A MEAL.    Dispense:  60 tablet    Refill:  0  . Semaglutide 14 MG TABS    Sig: TAKE 1 TABLET BY MOUTH ONCE DAILY    Dispense:  30 tablet    Refill:  0  . Vitamin D, Ergocalciferol, (DRISDOL) 1.25 MG (50000 UNIT) CAPS capsule    Sig: TAKE 1 CAPSULE BY MOUTH EVERY 7 (SEVEN) DAYS.    Dispense:  4 capsule    Refill:  0  . lisinopril (ZESTRIL) 2.5 MG  tablet    Sig: TAKE 1 TABLET BY MOUTH EVERY DAY TO PROTECT KIDNEYS    Dispense:  30 tablet    Refill:  0    1. Type 2 diabetes mellitus with other specified complication, with long-term current use of insulin (HCC) Diabetes Mellitus: Not at goal. Medication: glipizide 5 mg twice daily, Rybelsus 14 mg daily, Tresiba 10 units at bedtime. Issues reviewed: blood sugar goals, complications of diabetes mellitus, hypoglycemia prevention and treatment, exercise, and nutrition.  At last office visit, we changed from Trulicity to Rybelsus 14 mg.  Tolerating well.  Working well without side effects.  Less carb cravings.  Plan:  Will refill Rybelsus and glipizide today, as per below.  She still does not have a PCP.  Her first appointment is in July.  Will also refill lisinopril, which she takes for renal protection due to her diabetes- not for HTN per pt. - The patient will continue to focus on protein-rich, low simple carbohydrate foods. We reviewed the importance of hydration, regular exercise for stress reduction, and restorative sleep.   Lab Results  Component Value Date   HGBA1C 8.1 (H) 01/16/2020   HGBA1C 8.4 (H) 10/04/2019   HGBA1C 12.6 (H) 06/06/2019   Lab Results  Component Value Date   MICROALBUR <0.2 06/06/2019   LDLCALC 99  10/04/2019   CREATININE 0.60 01/16/2020   - Refill glipiZIDE (GLUCOTROL) 5 MG tablet; TAKE 1 TABLET BY MOUTH TWICE DAILY BEFORE A MEAL.  Dispense: 60 tablet; Refill: 0 - Refill Semaglutide 14 MG TABS; TAKE 1 TABLET BY MOUTH ONCE DAILY  Dispense: 30 tablet; Refill: 0 - Refill lisinopril (ZESTRIL) 2.5 MG tablet; TAKE 1 TABLET BY MOUTH EVERY DAY TO PROTECT KIDNEYS  Dispense: 30 tablet; Refill: 0  2. Vitamin D deficiency Improving, but not optimized. Current vitamin D is 47.6, tested on 10/30/2019. Optimal goal > 50 ng/dL.  She is taking vitamin D 50,000 IU weekly.   Plan: Continue to take prescription Vitamin D @50 ,000 IU every week as prescribed.  Follow-up for routine  testing of Vitamin D, at least 2-3 times per year to avoid over-replacement.  - Refill Vitamin D, Ergocalciferol, (DRISDOL) 1.25 MG (50000 UNIT) CAPS capsule; TAKE 1 CAPSULE BY MOUTH EVERY 7 (SEVEN) DAYS.  Dispense: 4 capsule; Refill: 0  3. At risk for impaired metabolic function Due to [name]'s current state of health and medical condition(s), she is at a significantly higher risk for impaired metabolic function.   At least 12 minutes was spent on counseling [name] about these concerns today.  This places the patient at a much greater risk to subsequently develop cardio-pulmonary conditions that can negatively affect the patient's quality of life.  I stressed the importance of reversing these risks factors.  The initial goal is to lose at least 5-10% of starting weight to help reduce risk factors.  Counseling:  Intensive lifestyle modifications discussed with [name] as the most appropriate first line treatment.  she will continue to work on diet, exercise, and weight loss efforts.  We will continue to reassess these conditions on a fairly regular basis in an attempt to decrease the patient's overall morbidity and mortality.  4. Obesity, current BMI 35.1  Course: Vicie is currently in the action stage of change. As such, her goal is to continue with weight loss efforts.   Nutrition goals: She has agreed to keeping a food journal and adhering to recommended goals of 1700 calories and 120 grams of protein.   Exercise goals: As is.  Behavioral modification strategies: increasing lean protein intake, decreasing simple carbohydrates, meal planning and cooking strategies, keeping healthy foods in the home, planning for success and keeping a strict food journal.  [name] has agreed to follow-up with our clinic in 2-3 weeks. She was informed of the importance of frequent follow-up visits to maximize her success with intensive lifestyle modifications for her multiple health conditions.   Objective:   Blood  pressure 129/76, pulse 76, temperature 97.9 F (36.6 C), height 5\' 9"  (1.753 m), weight 238 lb (108 kg), SpO2 99 %. Body mass index is 35.15 kg/m.  General: Cooperative, alert, well developed, in no acute distress. HEENT: Conjunctivae and lids unremarkable. Cardiovascular: Regular rhythm.  Lungs: Normal work of breathing. Neurologic: No focal deficits.   Lab Results  Component Value Date   CREATININE 0.60 01/16/2020   BUN 16 01/16/2020   NA 138 01/16/2020   K 4.4 01/16/2020   CL 101 01/16/2020   CO2 23 01/16/2020   Lab Results  Component Value Date   ALT 10 01/16/2020   AST 11 01/16/2020   ALKPHOS 127 (H) 01/16/2020   BILITOT 0.3 01/16/2020   Lab Results  Component Value Date   HGBA1C 8.1 (H) 01/16/2020   HGBA1C 8.4 (H) 10/04/2019   HGBA1C 12.6 (H) 06/06/2019   HGBA1C 11.1 (H)  10/17/2018   HGBA1C 13.2 (H) 09/14/2018   Lab Results  Component Value Date   TSH 2.100 07/18/2019   Lab Results  Component Value Date   CHOL 159 10/04/2019   HDL 44 (L) 10/04/2019   LDLCALC 99 10/04/2019   TRIG 70 10/04/2019   CHOLHDL 3.6 10/04/2019   Lab Results  Component Value Date   WBC 9.5 10/04/2019   HGB 13.0 10/04/2019   HCT 41.2 10/04/2019   MCV 81.1 10/04/2019   PLT 369 10/04/2019   Lab Results  Component Value Date   IRON 18 (L) 07/17/2013   Attestation Statements:   Reviewed by clinician on day of visit: allergies, medications, problem list, medical history, surgical history, family history, social history, and previous encounter notes.  I, Insurance claims handler, CMA, am acting as Energy manager for [employer], DO.  I have reviewed the above documentation for accuracy and completeness, and I agree with the above. Carlye Grippe, D.O.  The 21st Century Cures Act was signed into law in 2016 which includes the topic of electronic health records.  This provides immediate access to information in MyChart.  This includes consultation notes, operative notes, office  notes, lab results and pathology reports.  If you have any questions about what you read please let us know at your next visit so we can discuss your concerns and take corrective action if need be.  We are right here with you.

## 2020-06-13 ENCOUNTER — Ambulatory Visit (INDEPENDENT_AMBULATORY_CARE_PROVIDER_SITE_OTHER): Payer: No Typology Code available for payment source | Admitting: Family Medicine

## 2020-06-26 ENCOUNTER — Encounter (INDEPENDENT_AMBULATORY_CARE_PROVIDER_SITE_OTHER): Payer: Self-pay | Admitting: Family Medicine

## 2020-06-26 ENCOUNTER — Other Ambulatory Visit: Payer: Self-pay

## 2020-06-26 ENCOUNTER — Ambulatory Visit (INDEPENDENT_AMBULATORY_CARE_PROVIDER_SITE_OTHER): Payer: No Typology Code available for payment source | Admitting: Family Medicine

## 2020-06-26 VITALS — BP 137/80 | HR 88 | Temp 98.5°F | Ht 69.0 in | Wt 238.0 lb

## 2020-06-26 DIAGNOSIS — Z9189 Other specified personal risk factors, not elsewhere classified: Secondary | ICD-10-CM

## 2020-06-26 DIAGNOSIS — Z794 Long term (current) use of insulin: Secondary | ICD-10-CM

## 2020-06-26 DIAGNOSIS — E559 Vitamin D deficiency, unspecified: Secondary | ICD-10-CM | POA: Diagnosis not present

## 2020-06-26 DIAGNOSIS — E1169 Type 2 diabetes mellitus with other specified complication: Secondary | ICD-10-CM

## 2020-06-26 DIAGNOSIS — E1159 Type 2 diabetes mellitus with other circulatory complications: Secondary | ICD-10-CM | POA: Diagnosis not present

## 2020-06-26 DIAGNOSIS — Z6836 Body mass index (BMI) 36.0-36.9, adult: Secondary | ICD-10-CM

## 2020-06-26 DIAGNOSIS — I10 Essential (primary) hypertension: Secondary | ICD-10-CM | POA: Insufficient documentation

## 2020-06-26 DIAGNOSIS — I152 Hypertension secondary to endocrine disorders: Secondary | ICD-10-CM

## 2020-06-26 MED ORDER — VITAMIN D (ERGOCALCIFEROL) 1.25 MG (50000 UNIT) PO CAPS
ORAL_CAPSULE | ORAL | 0 refills | Status: DC
  Filled 2020-06-26: qty 4, 28d supply, fill #0

## 2020-06-26 MED ORDER — GLIPIZIDE 5 MG PO TABS
ORAL_TABLET | Freq: Two times a day (BID) | ORAL | 0 refills | Status: DC
  Filled 2020-06-26: qty 60, 30d supply, fill #0

## 2020-06-26 MED ORDER — SEMAGLUTIDE 14 MG PO TABS
1.0000 | ORAL_TABLET | Freq: Every day | ORAL | 0 refills | Status: DC
  Filled 2020-06-26: qty 30, 30d supply, fill #0

## 2020-06-26 MED ORDER — LISINOPRIL 2.5 MG PO TABS
ORAL_TABLET | ORAL | 0 refills | Status: DC
  Filled 2020-06-26: qty 30, 30d supply, fill #0

## 2020-06-27 LAB — HEMOGLOBIN A1C
Est. average glucose Bld gHb Est-mCnc: 163 mg/dL
Hgb A1c MFr Bld: 7.3 % — ABNORMAL HIGH (ref 4.8–5.6)

## 2020-06-27 LAB — VITAMIN D 25 HYDROXY (VIT D DEFICIENCY, FRACTURES): Vit D, 25-Hydroxy: 42.2 ng/mL (ref 30.0–100.0)

## 2020-07-01 ENCOUNTER — Other Ambulatory Visit (HOSPITAL_COMMUNITY): Payer: Self-pay

## 2020-07-08 NOTE — Progress Notes (Signed)
Chief Complaint:   OBESITY Zahraa is here to discuss her progress with her obesity treatment plan along with follow-up of her obesity related diagnoses.   Today's visit was #: 17 Starting weight: 243 lbs Starting date: 07/18/2019 Today's weight: 238 lbs Today's date: 06/26/2020 Weight change since last visit: 0 Total lbs lost to date: 5 lbs Body mass index is 35.15 kg/m.  Total weight loss percentage to date: -2.06%  Interim History:  Nioma joined a gym and is going 1-2 days per week.  He ate on plan 2 out of 3 meals per day.  She usually eats out at dinnertime and occasionally skips a meal.  Only drinking 3-4 cups of water per day.  Plan:  New PCP appointment in July.  Eating Out guide discussed and given as a reference.  Current Meal Plan: keeping a food journal and adhering to recommended goals of 1700 calories and 120 grams of protein for 65% of the time.  Current Exercise Plan: Going to the gym for 120 minutes 2 times per week. Current Anti-Obesity Medications: Rybelsus 14 mg daily. Side effects: None.  Assessment/Plan:   Orders Placed This Encounter  Procedures   Hemoglobin A1c   VITAMIN D 25 Hydroxy (Vit-D Deficiency, Fractures)    Medications Discontinued During This Encounter  Medication Reason   glipiZIDE (GLUCOTROL) 5 MG tablet Reorder   Semaglutide 14 MG TABS Reorder   Vitamin D, Ergocalciferol, (DRISDOL) 1.25 MG (50000 UNIT) CAPS capsule Reorder   lisinopril (ZESTRIL) 2.5 MG tablet Reorder    Meds ordered this encounter  Medications   glipiZIDE (GLUCOTROL) 5 MG tablet    Sig: TAKE 1 TABLET BY MOUTH TWICE DAILY BEFORE A MEAL.    Dispense:  60 tablet    Refill:  0   lisinopril (ZESTRIL) 2.5 MG tablet    Sig: TAKE 1 TABLET BY MOUTH EVERY DAY TO PROTECT KIDNEYS    Dispense:  30 tablet    Refill:  0   Semaglutide 14 MG TABS    Sig: TAKE 1 TABLET BY MOUTH ONCE DAILY    Dispense:  30 tablet    Refill:  0   Vitamin D, Ergocalciferol, (DRISDOL) 1.25 MG  (50000 UNIT) CAPS capsule    Sig: TAKE 1 CAPSULE BY MOUTH EVERY 7 (SEVEN) DAYS.    Dispense:  4 capsule    Refill:  0    1. Type 2 diabetes mellitus with other specified complication, with long-term current use of insulin (HCC) Diabetes Mellitus: Not at goal. Medication: glipizide 5 twice daily, Rybelsus 14 mg daily, Tresiba. Issues reviewed: blood sugar goals, complications of diabetes mellitus, hypoglycemia prevention and treatment, exercise, and nutrition.  FBS <160 most days.  No lows or [place] blood sugars.  Highest 160 but no symptoms or concerns.  Plan: The importance of regular follow up with PCP and all other specialists as scheduled was stressed to patient today. The patient will continue to focus on protein-rich, low simple carbohydrate foods. We reviewed the importance of hydration, regular exercise for stress reduction, and restorative sleep. Will refill Glucotrol and Rybelsus today.  Will check A1c today.  Lab Results  Component Value Date   HGBA1C 7.3 (H) 06/26/2020   HGBA1C 8.1 (H) 01/16/2020   HGBA1C 8.4 (H) 10/04/2019   Lab Results  Component Value Date   MICROALBUR <0.2 06/06/2019   LDLCALC 99 10/04/2019   CREATININE 0.60 01/16/2020   - Refill glipiZIDE (GLUCOTROL) 5 MG tablet; TAKE 1 TABLET BY MOUTH TWICE  DAILY BEFORE A MEAL.  Dispense: 60 tablet; Refill: 0 - Refill lisinopril (ZESTRIL) 2.5 MG tablet; TAKE 1 TABLET BY MOUTH EVERY DAY TO PROTECT KIDNEYS  Dispense: 30 tablet; Refill: 0 - Refill Semaglutide 14 MG TABS; TAKE 1 TABLET BY MOUTH ONCE DAILY  Dispense: 30 tablet; Refill: 0 - Hemoglobin A1c  2. Vitamin D deficiency Improving, but not optimized. Current vitamin D is 47.6, tested on 10/30/2019. Optimal goal > 50 ng/dL.  She is taking vitamin D 50,000 IU weekly.  Plan: Continue to take prescription Vitamin D @50 ,000 IU every week as prescribed.  Will check vitamin D level today.  - Refill Vitamin D, Ergocalciferol, (DRISDOL) 1.25 MG (50000 UNIT) CAPS capsule; TAKE  1 CAPSULE BY MOUTH EVERY 7 (SEVEN) DAYS.  Dispense: 4 capsule; Refill: 0 - VITAMIN D 25 Hydroxy (Vit-D Deficiency, Fractures)  3. Hypertension associated with type 2 diabetes mellitus (HCC) At goal. Medications: lisinopril 2.5 mg daily.   Plan:  At goal.  Avoid buying foods that are: processed, frozen, or prepackaged to avoid excess salt. We will watch for signs of hypotension as she continues lifestyle modifications. We will continue to monitor closely alongside her PCP and/or Specialist.  Regular follow up with PCP and specialists was also encouraged.   BP Readings from Last 3 Encounters:  06/26/20 137/80  05/29/20 129/76  04/24/20 121/78   Lab Results  Component Value Date   CREATININE 0.60 01/16/2020   4. At risk for hypoglycemia Ameli was given approximately 9 minutes of counseling today regarding prevention of hypoglycemia.  She was advised of symptoms of hypoglycemia.  Frida was instructed to avoid skipping meals and to eat regular protein-rich meals as prescribed on the meal plan.  The patient should have readily available low calorie snacks as needed, such as Welch's fruit snack packs, if blood sugar goes too low.    5. Obesity, current BMI 35.1  Course: Eleri is currently in the action stage of change. As such, her goal is to continue with weight loss efforts.   Nutrition goals: She has agreed to the Category 2 Plan and keeping a food journal and adhering to recommended goals of 1700 calories and 120+ grams of protein.   Exercise goals: For substantial health benefits, adults should do at least 150 minutes (2 hours and 30 minutes) a week of moderate-intensity, or 75 minutes (1 hour and 15 minutes) a week of vigorous-intensity aerobic physical activity, or an equivalent combination of moderate- and vigorous-intensity aerobic activity. Aerobic activity should be performed in episodes of at least 10 minutes, and preferably, it should be spread throughout the week.  Behavioral  modification strategies: increasing water intake and planning for success.  Vineta has agreed to follow-up with our clinic in 3 weeks. She was informed of the importance of frequent follow-up visits to maximize her success with intensive lifestyle modifications for her multiple health conditions.   Objective:   Blood pressure 137/80, pulse 88, temperature 98.5 F (36.9 C), height 5\' 9"  (1.753 m), weight 238 lb (108 kg), SpO2 97 %. Body mass index is 35.15 kg/m.  General: Cooperative, alert, well developed, in no acute distress. HEENT: Conjunctivae and lids unremarkable. Cardiovascular: Regular rhythm.  Lungs: Normal work of breathing. Neurologic: No focal deficits.   Lab Results  Component Value Date   CREATININE 0.60 01/16/2020   BUN 16 01/16/2020   NA 138 01/16/2020   K 4.4 01/16/2020   CL 101 01/16/2020   CO2 23 01/16/2020   Lab Results  Component  Value Date   ALT 10 01/16/2020   AST 11 01/16/2020   ALKPHOS 127 (H) 01/16/2020   BILITOT 0.3 01/16/2020   Lab Results  Component Value Date   HGBA1C 7.3 (H) 06/26/2020   HGBA1C 8.1 (H) 01/16/2020   HGBA1C 8.4 (H) 10/04/2019   HGBA1C 12.6 (H) 06/06/2019   HGBA1C 11.1 (H) 10/17/2018   Lab Results  Component Value Date   TSH 2.100 07/18/2019   Lab Results  Component Value Date   CHOL 159 10/04/2019   HDL 44 (L) 10/04/2019   LDLCALC 99 10/04/2019   TRIG 70 10/04/2019   CHOLHDL 3.6 10/04/2019   Lab Results  Component Value Date   WBC 9.5 10/04/2019   HGB 13.0 10/04/2019   HCT 41.2 10/04/2019   MCV 81.1 10/04/2019   PLT 369 10/04/2019   Lab Results  Component Value Date   IRON 18 (L) 07/17/2013   Attestation Statements:   Reviewed by clinician on day of visit: allergies, medications, problem list, medical history, surgical history, family history, social history, and previous encounter notes.  I, Insurance claims handler, CMA, am acting as Energy manager for [employer], DO.  I have reviewed the above  documentation for accuracy and completeness, and I agree with the above. Carlye Grippe, D.O.  The 21st Century Cures Act was signed into law in 2016 which includes the topic of electronic health records.  This provides immediate access to information in MyChart.  This includes consultation notes, operative notes, office notes, lab results and pathology reports.  If you have any questions about what you read please let us know at your next visit so we can discuss your concerns and take corrective action if need be.  We are right here with you.

## 2020-07-17 ENCOUNTER — Ambulatory Visit (INDEPENDENT_AMBULATORY_CARE_PROVIDER_SITE_OTHER): Payer: No Typology Code available for payment source | Admitting: Family Medicine

## 2020-07-17 ENCOUNTER — Other Ambulatory Visit: Payer: Self-pay

## 2020-07-17 ENCOUNTER — Encounter (INDEPENDENT_AMBULATORY_CARE_PROVIDER_SITE_OTHER): Payer: Self-pay | Admitting: Family Medicine

## 2020-07-17 VITALS — BP 124/76 | HR 73 | Temp 98.5°F | Ht 69.0 in | Wt 237.0 lb

## 2020-07-17 DIAGNOSIS — Z9189 Other specified personal risk factors, not elsewhere classified: Secondary | ICD-10-CM

## 2020-07-17 DIAGNOSIS — E559 Vitamin D deficiency, unspecified: Secondary | ICD-10-CM

## 2020-07-17 DIAGNOSIS — Z794 Long term (current) use of insulin: Secondary | ICD-10-CM

## 2020-07-17 DIAGNOSIS — E1169 Type 2 diabetes mellitus with other specified complication: Secondary | ICD-10-CM

## 2020-07-17 DIAGNOSIS — E860 Dehydration: Secondary | ICD-10-CM | POA: Diagnosis not present

## 2020-07-17 DIAGNOSIS — Z6835 Body mass index (BMI) 35.0-35.9, adult: Secondary | ICD-10-CM

## 2020-07-17 MED ORDER — SEMAGLUTIDE 14 MG PO TABS
1.0000 | ORAL_TABLET | Freq: Every day | ORAL | 0 refills | Status: DC
  Filled 2020-07-17 – 2020-08-05 (×2): qty 30, 30d supply, fill #0

## 2020-07-17 MED ORDER — LISINOPRIL 2.5 MG PO TABS
ORAL_TABLET | ORAL | 0 refills | Status: DC
  Filled 2020-07-17 – 2020-08-06 (×2): qty 30, 30d supply, fill #0

## 2020-07-22 ENCOUNTER — Other Ambulatory Visit: Payer: Self-pay

## 2020-07-24 NOTE — Progress Notes (Signed)
Chief Complaint:   OBESITY Samantha Lucas is here to discuss her progress with her obesity treatment plan along with follow-up of her obesity related diagnoses.   Today's visit was #: 18 Starting weight: 243 lbs Starting date: 07/18/2019 Today's weight: 237 lbs Today's date: 07/17/2020 Weight change since last visit: 1 lb Total lbs lost to date: 6 lbs Body mass index is 35 kg/m.  Total weight loss percentage to date: -2.47%  Interim History:  Samantha Lucas has been journaling 50% of the time.  When she is journaling, she hits her protein goals 80% of the time and her calories 50% of the time, usually less calories than she needs.  Plan:  Write down date, total calories per day, and grams of protein on a separate sheet.  Be consistent with foods/journaling.  Current Meal Plan: the Category 2 Plan or keeping a food journal and adhering to recommended goals of 1700 calories and 120+ grams of protein for 50% of the time.  Current Exercise Plan: None. Current Anti-Obesity Medications: Rybelsus 14 mg daily. Side effects: None.  Assessment/Plan:   Medications Discontinued During This Encounter  Medication Reason   lisinopril (ZESTRIL) 2.5 MG tablet Reorder   Semaglutide 14 MG TABS Reorder   Meds ordered this encounter  Medications   lisinopril (ZESTRIL) 2.5 MG tablet    Sig: TAKE 1 TABLET BY MOUTH EVERY DAY TO PROTECT KIDNEYS    Dispense:  30 tablet    Refill:  0   Semaglutide 14 MG TABS    Sig: TAKE 1 TABLET BY MOUTH ONCE DAILY    Dispense:  30 tablet    Refill:  0   1. Mild dehydration She is drinking 4 bottles of water per day consistently.  Plan:  Increase to half her weight in ounces of water per day.  2. Type 2 diabetes mellitus with other specified complication, with long-term current use of insulin (HCC) Diabetes Mellitus:  Discussed labs with patient today.  Not at goal. Medication: glipizide 5 mg twice daily, Tresiba 10 units at bedtime, Rybelsus 14 mg daily. Issues reviewed:  blood sugar goals, complications of diabetes mellitus, hypoglycemia prevention and treatment, exercise, and nutrition.  This is the best her A1c has been in several years.  A1c is 7.3.  Plan:  Discussed labs with patient today.  Will refill lisinopril and Rybelsus today. The importance of regular follow up with PCP and all other specialists as scheduled was stressed to patient today. The patient will continue to focus on protein-rich, low simple carbohydrate foods. We reviewed the importance of hydration, regular exercise for stress reduction, and restorative sleep.   Lab Results  Component Value Date   HGBA1C 7.3 (H) 06/26/2020   HGBA1C 8.1 (H) 01/16/2020   HGBA1C 8.4 (H) 10/04/2019   Lab Results  Component Value Date   MICROALBUR <0.2 06/06/2019   LDLCALC 99 10/04/2019   CREATININE 0.60 01/16/2020   - Refill lisinopril (ZESTRIL) 2.5 MG tablet; TAKE 1 TABLET BY MOUTH EVERY DAY TO PROTECT KIDNEYS  Dispense: 30 tablet; Refill: 0 - Refill  Semaglutide 14 MG TABS; TAKE 1 TABLET BY MOUTH ONCE DAILY  Dispense: 30 tablet; Refill: 0  3. Vitamin D deficiency Improving, but not optimized.  She is taking vitamin D 50,000 IU weekly.  She has skipped some doses here and there.  Plan:  Discussed labs with patient today.  Not at goal.  Be consistent with medications.  Get pill box.  Continue to take prescription Vitamin D @50 ,000  IU every week as prescribed.  Follow-up for routine testing of Vitamin D, at least 2-3 times per year to avoid over-replacement.  Lab Results  Component Value Date   VD25OH 42.2 06/26/2020   VD25OH 47.6 10/30/2019   VD25OH 22.4 (L) 07/18/2019   4. At risk for hypoglycemia Samantha Lucas was given approximately 9 minutes of counseling today regarding prevention of hypoglycemia.  She was advised of symptoms of hypoglycemia.  Samantha Lucas was instructed to avoid skipping meals and to eat regular protein-rich meals as prescribed on the meal plan.  The patient should have readily available low  calorie snacks as needed, such as Welch's fruit snack packs, if blood sugar goes too low.    5. Class 2 severe obesity with serious comorbidity and body mass index (BMI) of 35.0 to 35.9 in adult, unspecified obesity type (HCC)  Course: Samantha Lucas is currently in the action stage of change. As such, her goal is to continue with weight loss efforts.   Nutrition goals: She has agreed to the Category 2 Plan or keeping a food journal and adhering to recommended goals of 1500-1700 calories and 120 grams of protein.   Exercise goals: All adults should avoid inactivity. Some physical activity is better than none, and adults who participate in any amount of physical activity gain some health benefits.  Behavioral modification strategies: increasing lean protein intake, no skipping meals, meal planning and cooking strategies, and planning for success.  Samantha Lucas has agreed to follow-up with our clinic in 2-3 weeks. She was informed of the importance of frequent follow-up visits to maximize her success with intensive lifestyle modifications for her multiple health conditions.   Objective:   Blood pressure 124/76, pulse 73, temperature 98.5 F (36.9 C), height 5\' 9"  (1.753 m), weight 237 lb (107.5 kg), SpO2 97 %. Body mass index is 35 kg/m.  General: Cooperative, alert, well developed, in no acute distress. HEENT: Conjunctivae and lids unremarkable. Cardiovascular: Regular rhythm.  Lungs: Normal work of breathing. Neurologic: No focal deficits.   Lab Results  Component Value Date   CREATININE 0.60 01/16/2020   BUN 16 01/16/2020   NA 138 01/16/2020   K 4.4 01/16/2020   CL 101 01/16/2020   CO2 23 01/16/2020   Lab Results  Component Value Date   ALT 10 01/16/2020   AST 11 01/16/2020   ALKPHOS 127 (H) 01/16/2020   BILITOT 0.3 01/16/2020   Lab Results  Component Value Date   HGBA1C 7.3 (H) 06/26/2020   HGBA1C 8.1 (H) 01/16/2020   HGBA1C 8.4 (H) 10/04/2019   HGBA1C 12.6 (H) 06/06/2019   HGBA1C  11.1 (H) 10/17/2018   Lab Results  Component Value Date   TSH 2.100 07/18/2019   Lab Results  Component Value Date   CHOL 159 10/04/2019   HDL 44 (L) 10/04/2019   LDLCALC 99 10/04/2019   TRIG 70 10/04/2019   CHOLHDL 3.6 10/04/2019   Lab Results  Component Value Date   VD25OH 42.2 06/26/2020   VD25OH 47.6 10/30/2019   VD25OH 22.4 (L) 07/18/2019   Lab Results  Component Value Date   WBC 9.5 10/04/2019   HGB 13.0 10/04/2019   HCT 41.2 10/04/2019   MCV 81.1 10/04/2019   PLT 369 10/04/2019   Lab Results  Component Value Date   IRON 18 (L) 07/17/2013   Attestation Statements:   Reviewed by clinician on day of visit: allergies, medications, problem list, medical history, surgical history, family history, social history, and previous encounter notes.  I, 07/19/2013,  CMA, am acting as transcriptionist for [employer], DO.  I have reviewed the above documentation for accuracy and completeness, and I agree with the above. Carlye Grippe, D.O.  The 21st Century Cures Act was signed into law in 2016 which includes the topic of electronic health records.  This provides immediate access to information in MyChart.  This includes consultation notes, operative notes, office notes, lab results and pathology reports.  If you have any questions about what you read please let us know at your next visit so we can discuss your concerns and take corrective action if need be.  We are right here with you.

## 2020-08-02 ENCOUNTER — Ambulatory Visit (INDEPENDENT_AMBULATORY_CARE_PROVIDER_SITE_OTHER): Payer: No Typology Code available for payment source | Admitting: Internal Medicine

## 2020-08-02 ENCOUNTER — Other Ambulatory Visit: Payer: Self-pay

## 2020-08-02 ENCOUNTER — Encounter: Payer: Self-pay | Admitting: Internal Medicine

## 2020-08-02 VITALS — BP 122/72 | HR 76 | Temp 98.5°F | Resp 18 | Ht 69.0 in | Wt 245.2 lb

## 2020-08-02 DIAGNOSIS — I152 Hypertension secondary to endocrine disorders: Secondary | ICD-10-CM

## 2020-08-02 DIAGNOSIS — Z9189 Other specified personal risk factors, not elsewhere classified: Secondary | ICD-10-CM | POA: Diagnosis not present

## 2020-08-02 DIAGNOSIS — E1169 Type 2 diabetes mellitus with other specified complication: Secondary | ICD-10-CM

## 2020-08-02 DIAGNOSIS — E118 Type 2 diabetes mellitus with unspecified complications: Secondary | ICD-10-CM | POA: Diagnosis not present

## 2020-08-02 DIAGNOSIS — Z794 Long term (current) use of insulin: Secondary | ICD-10-CM

## 2020-08-02 DIAGNOSIS — E1159 Type 2 diabetes mellitus with other circulatory complications: Secondary | ICD-10-CM

## 2020-08-02 DIAGNOSIS — E559 Vitamin D deficiency, unspecified: Secondary | ICD-10-CM | POA: Diagnosis not present

## 2020-08-02 NOTE — Progress Notes (Signed)
   Subjective:   Patient ID: Samantha Lucas, female    DOB: 05-21-1990, 30 y.o.   MRN: [id number]  HPI The patient is a new 30 YO female coming in for diabetes (taking rybelsus and glipizide and tresiba 10 units daily, denies low sugars, previously [place] HgA1c >12 at diagnosis, last 7.3, denies neuropathy, yearly eye exam up to date),and obesity (working on weight loss with weight clinic, overall satisfied with how she is doing lately)  PMH, [facility], social history reviewed and updated  Review of Systems  Constitutional: Negative.   HENT: Negative.    Eyes: Negative.   Respiratory:  Negative for cough, chest tightness and shortness of breath.   Cardiovascular:  Negative for chest pain, palpitations and leg swelling.  Gastrointestinal:  Negative for abdominal distention, abdominal pain, constipation, diarrhea, nausea and vomiting.  Musculoskeletal: Negative.   Skin: Negative.   Neurological: Negative.   Psychiatric/Behavioral: Negative.     Objective:  Physical Exam Constitutional:      Appearance: She is well-developed.  HENT:     Head: Normocephalic and atraumatic.  Cardiovascular:     Rate and Rhythm: Normal rate and regular rhythm.  Pulmonary:     Effort: Pulmonary effort is normal. No respiratory distress.     Breath sounds: Normal breath sounds. No wheezing or rales.  Abdominal:     General: Bowel sounds are normal. There is no distension.     Palpations: Abdomen is soft.     Tenderness: There is no abdominal tenderness. There is no rebound.  Musculoskeletal:     Cervical back: Normal range of motion.  Skin:    General: Skin is warm and dry.     Comments: Foot exam done  Neurological:     Mental Status: She is alert and oriented to person, place, and time.     Coordination: Coordination normal.    Vitals:   08/02/20 1337  BP: 122/72  Pulse: 76  Resp: 18  Temp: 98.5 F (36.9 C)  TempSrc: Oral  SpO2: 99%  Weight: 245 lb 3.2 oz (111.2 kg)  Height: 5\' 9"  (1.753 m)     This visit occurred during the SARS-CoV-2 public health emergency.  Safety protocols were in place, including screening questions prior to the visit, additional usage of staff PPE, and extensive cleaning of exam room while observing appropriate contact time as indicated for disinfecting solutions.   Assessment & Plan:

## 2020-08-02 NOTE — Patient Instructions (Signed)
Think about getting the covid-19 booster this fall.

## 2020-08-02 NOTE — Assessment & Plan Note (Signed)
Taking vitamin D replacement.

## 2020-08-02 NOTE — Assessment & Plan Note (Signed)
Taking lisinopril for renal protection. Last microalbumin to creatinine ratio normal. Recent HgA1c 7.3. Taking tresiba 10 units daily, glipizide 5 mg daily, rybelsus 14 mg daily. Will continue and should have monitoring every 3 months given prior [place]iercio levels.

## 2020-08-02 NOTE — Assessment & Plan Note (Signed)
BP at goal today on lisinopril 2.5 mg daily.

## 2020-08-02 NOTE — Assessment & Plan Note (Signed)
BMI 36 complicated by diabetes, hypertension, back pain (intermittent).

## 2020-08-05 ENCOUNTER — Encounter (INDEPENDENT_AMBULATORY_CARE_PROVIDER_SITE_OTHER): Payer: Self-pay | Admitting: Family Medicine

## 2020-08-05 ENCOUNTER — Other Ambulatory Visit (HOSPITAL_COMMUNITY): Payer: Self-pay

## 2020-08-05 ENCOUNTER — Other Ambulatory Visit: Payer: Self-pay

## 2020-08-05 ENCOUNTER — Ambulatory Visit (INDEPENDENT_AMBULATORY_CARE_PROVIDER_SITE_OTHER): Payer: No Typology Code available for payment source | Admitting: Family Medicine

## 2020-08-05 VITALS — BP 116/73 | HR 91 | Temp 98.6°F | Ht 69.0 in | Wt 238.0 lb

## 2020-08-05 DIAGNOSIS — Z9189 Other specified personal risk factors, not elsewhere classified: Secondary | ICD-10-CM | POA: Diagnosis not present

## 2020-08-05 DIAGNOSIS — E559 Vitamin D deficiency, unspecified: Secondary | ICD-10-CM

## 2020-08-05 DIAGNOSIS — E1169 Type 2 diabetes mellitus with other specified complication: Secondary | ICD-10-CM

## 2020-08-05 DIAGNOSIS — Z794 Long term (current) use of insulin: Secondary | ICD-10-CM

## 2020-08-05 DIAGNOSIS — Z6836 Body mass index (BMI) 36.0-36.9, adult: Secondary | ICD-10-CM

## 2020-08-05 MED ORDER — VITAMIN D (ERGOCALCIFEROL) 1.25 MG (50000 UNIT) PO CAPS
ORAL_CAPSULE | ORAL | 0 refills | Status: DC
  Filled 2020-08-05: qty 4, 28d supply, fill #0

## 2020-08-06 ENCOUNTER — Other Ambulatory Visit: Payer: Self-pay

## 2020-08-06 ENCOUNTER — Other Ambulatory Visit: Payer: Self-pay | Admitting: Internal Medicine

## 2020-08-06 DIAGNOSIS — E1169 Type 2 diabetes mellitus with other specified complication: Secondary | ICD-10-CM

## 2020-08-06 MED FILL — Glipizide Tab 5 MG: ORAL | 90 days supply | Qty: 180 | Fill #0 | Status: AC

## 2020-08-15 NOTE — Progress Notes (Signed)
Chief Complaint:   OBESITY Samantha Lucas is here to discuss her progress with her obesity treatment plan along with follow-up of her obesity related diagnoses.   Today's visit was #: 19 Starting weight: 243 lbs Starting date: 07/18/2019 Today's weight: 238 lbs Today's date: 08/05/2020 Weight change since last visit: +1 lb Total lbs lost to date: 5 lbs Body mass index is 35.15 kg/m.  Total weight loss percentage to date: -2.06%  Interim History:  Samantha Lucas is still eating fast foods for dinner 5 nights per week on average.  She likes McDonald[employer], Samantha Lucas, [employer].  Plan:  Bring in a log of date, total calories, total grams of protein.  Journal everything you eat.  Only eat out 3 days per week.  Current Meal Plan: the Category 2 Plan or keeping a food journal and adhering to recommended goals of 1500-1700 calories and 120 grams of protein for 70% of the time.  Current Exercise Plan: Walking, gym for 60 minutes 2 times per week.  Assessment/Plan:   Medications Discontinued During This Encounter  Medication Reason   Vitamin D, Ergocalciferol, (DRISDOL) 1.25 MG (50000 UNIT) CAPS capsule Reorder    Meds ordered this encounter  Medications   Vitamin D, Ergocalciferol, (DRISDOL) 1.25 MG (50000 UNIT) CAPS capsule    Sig: TAKE 1 CAPSULE BY MOUTH EVERY 7 (SEVEN) DAYS.    Dispense:  4 capsule    Refill:  0    30 d supply; ov for rf   1. Type 2 diabetes mellitus with other specified complication, with long-term current use of insulin (HCC) Diabetes Mellitus: Not at goal. Medication: Samantha Lucas, Rybelsus 14 mg daily, glipizide 5 mg twice daily. Issues reviewed: blood sugar goals, complications of diabetes mellitus, hypoglycemia prevention and treatment, exercise, and nutrition. FBS 105.  Highest is 141.  Denies lows or concerns.  Plan:  Continue insulin, continue glipizide, continue Rybelsus.  Follow prudent nutritional plan, exercise, weight loss. The importance of regular follow up with PCP and  all other specialists as scheduled was stressed to patient today. The patient will continue to focus on protein-rich, low simple carbohydrate foods. We reviewed the importance of hydration, regular exercise for stress reduction, and restorative sleep.   Lab Results  Component Value Date   HGBA1C 7.3 (H) 06/26/2020   HGBA1C 8.1 (H) 01/16/2020   HGBA1C 8.4 (H) 10/04/2019   Lab Results  Component Value Date   MICROALBUR <0.2 06/06/2019   LDLCALC 99 10/04/2019   CREATININE 0.60 01/16/2020   2. Vitamin D deficiency Not at goal.  She is taking vitamin D 50,000 IU weekly.  She has been taking this consistently since mid June 2022.  Tolerating well.  Plan: Continue to take prescription Vitamin D @50 ,000 IU every week as prescribed.  Follow-up for routine testing of Vitamin D, at least 2-3 times per year to avoid over-replacement.  Lab Results  Component Value Date   VD25OH 42.2 06/26/2020   VD25OH 47.6 10/30/2019   VD25OH 22.4 (L) 07/18/2019   - Refill Vitamin D, Ergocalciferol, (DRISDOL) 1.25 MG (50000 UNIT) CAPS capsule; TAKE 1 CAPSULE BY MOUTH EVERY 7 (SEVEN) DAYS.  Dispense: 4 capsule; Refill: 0  3. At risk for hypoglycemia Samantha Lucas was given approximately 8 minutes of counseling today regarding prevention of hypoglycemia.  She has dextrose tablets in her purse.  She was advised of symptoms of hypoglycemia.  Samantha Lucas was instructed to avoid skipping meals and to eat regular protein-rich meals as prescribed on the meal plan.  The  patient should have readily available low calorie snacks as needed, such as [employer] fruit snack packs, if blood sugar goes too low.    4. Obesity BMI today 35  Course: Samantha Lucas is currently in the action stage of change. As such, her goal is to continue with weight loss efforts.   Nutrition goals: She has agreed to the Category 2 Plan or keeping a food journal and adhering to recommended goals of 1500-1700 calories and 120 grams of protein.   Exercise goals:  As  is.  Behavioral modification strategies: decreasing simple carbohydrates, decreasing eating out, and no skipping meals.  Samantha Lucas has agreed to follow-up with our clinic in 3 weeks. She was informed of the importance of frequent follow-up visits to maximize her success with intensive lifestyle modifications for her multiple health conditions.   Objective:   Blood pressure 116/73, pulse 91, temperature 98.6 F (37 C), height 5\' 9"  (1.753 m), weight 238 lb (108 kg), last menstrual period 07/30/2020, SpO2 99 %. Body mass index is 35.15 kg/m.  General: Cooperative, alert, well developed, in no acute distress. HEENT: Conjunctivae and lids unremarkable. Cardiovascular: Regular rhythm.  Lungs: Normal work of breathing. Neurologic: No focal deficits.   Lab Results  Component Value Date   CREATININE 0.60 01/16/2020   BUN 16 01/16/2020   NA 138 01/16/2020   K 4.4 01/16/2020   CL 101 01/16/2020   CO2 23 01/16/2020   Lab Results  Component Value Date   ALT 10 01/16/2020   AST 11 01/16/2020   ALKPHOS 127 (H) 01/16/2020   BILITOT 0.3 01/16/2020   Lab Results  Component Value Date   HGBA1C 7.3 (H) 06/26/2020   HGBA1C 8.1 (H) 01/16/2020   HGBA1C 8.4 (H) 10/04/2019   HGBA1C 12.6 (H) 06/06/2019   HGBA1C 11.1 (H) 10/17/2018   Lab Results  Component Value Date   TSH 2.100 07/18/2019   Lab Results  Component Value Date   CHOL 159 10/04/2019   HDL 44 (L) 10/04/2019   LDLCALC 99 10/04/2019   TRIG 70 10/04/2019   CHOLHDL 3.6 10/04/2019   Lab Results  Component Value Date   VD25OH 42.2 06/26/2020   VD25OH 47.6 10/30/2019   VD25OH 22.4 (L) 07/18/2019   Lab Results  Component Value Date   WBC 9.5 10/04/2019   HGB 13.0 10/04/2019   HCT 41.2 10/04/2019   MCV 81.1 10/04/2019   PLT 369 10/04/2019   Lab Results  Component Value Date   IRON 18 (L) 07/17/2013   Attestation Statements:   Reviewed by clinician on day of visit: allergies, medications, problem list, medical history,  surgical history, family history, social history, and previous encounter notes.  I, 07/19/2013, CMA, am acting as Insurance claims handler for Energy manager, DO.  I have reviewed the above documentation for accuracy and completeness, and I agree with the above. [employer], D.O.  The 21st Century Cures Act was signed into law in 2016 which includes the topic of electronic health records.  This provides immediate access to information in MyChart.  This includes consultation notes, operative notes, office notes, lab results and pathology reports.  If you have any questions about what you read please let 2017 know at your next visit so we can discuss your concerns and take corrective action if need be.  We are right here with you.

## 2020-08-20 ENCOUNTER — Telehealth: Payer: No Typology Code available for payment source | Admitting: Physician Assistant

## 2020-08-20 ENCOUNTER — Other Ambulatory Visit: Payer: Self-pay

## 2020-08-20 DIAGNOSIS — Z20822 Contact with and (suspected) exposure to covid-19: Secondary | ICD-10-CM

## 2020-08-20 MED ORDER — BENZONATATE 100 MG PO CAPS
100.0000 mg | ORAL_CAPSULE | Freq: Three times a day (TID) | ORAL | 0 refills | Status: DC | PRN
  Filled 2020-08-20: qty 30, 10d supply, fill #0

## 2020-08-20 NOTE — Progress Notes (Signed)
E-Visit for Corona Virus Screening ° °Your current symptoms could be consistent with the coronavirus.  Many health care providers can now test patients at their office but not all are.  [facility] has multiple testing sites. For information on our COVID testing locations and hours go to [email or web address] ° °We are enrolling you in our MyChart Home Monitoring for COVID19 . Daily you will receive a questionnaire within the MyChart website. Our COVID 19 response team will be monitoring your responses daily. ° °Testing Information: °The COVID-19 Community Testing sites are testing BY APPOINTMENT ONLY.  You can schedule online at [email or web address]  If you do not have access to a smart phone or computer you may call [phone] for an appointment. ° ° °Additional testing sites in the Community: ° °For [facility] Testing sites in [place]  https://www.[facility]vs.com/minuteclinic/covid-19-testing ° °For Pop-up testing sites in [place]  https://covid19.ncdhhs.gov/about-covid-19/testing/find-my-testing-place/pop-testing-sites ° °For [facility] https://[place].gov[place]/human-services/health-department/coronavirus-covid-19-info/covid-19-testing ° °For [place] testing in [place] and [place] https://www[place].gov/[place]/human-services/health-department/coronavirus-covid-19-info/covid-19-testing ° °For [facility] testing in [place]   https://[facility].care/covidtesting ° °For  more information about community testing call [phone] ° ° °Please quarantine yourself while awaiting your test results. Please stay home for a minimum of 10 days from the first day of illness with improving symptoms and you have had 24 hours of no fever (without the use of Tylenol (Acetaminophen) Motrin (Ibuprofen) or any fever reducing medication).  Also - Do not get tested prior to returning to work because once you have had a positive test the test can stay positive for  more than a month in some cases.  ° °You should wear a mask or cloth face covering over your nose and mouth if you must be around other people or animals, including pets (even at home). Try to stay at least 6 feet away from other people. This will protect the people around you.  Please continue good preventive care measures, including:  frequent hand-washing, avoid touching your face, cover coughs/sneezes, stay out of crowds and keep a 6 foot distance from others.  COVID-19 is a respiratory illness with symptoms that are similar to the flu. Symptoms are typically mild to moderate, but there have been cases of severe illness and death due to the virus.  ° °The following symptoms may appear 2-14 days after exposure: °Fever °Cough °Shortness of breath or difficulty breathing °Chills °Repeated shaking with chills °Muscle pain °Headache °Sore throat °New loss of taste or smell °Fatigue °Congestion or runny nose °Nausea or vomiting °Diarrhea ° °Go to the nearest hospital ED for assessment if fever/cough/breathlessness are severe or illness seems like a threat to life.  It is vitally important that if you feel that you have an infection such as this virus or any other virus that you stay home and away from places where you may spread it to others.  You should avoid contact with people age 65 and older.  ° °You can use medication such as prescription cough medication called Tessalon Perles 100 mg. You may take 1-2 capsules every 8 hours as needed for cough ° °You may also take acetaminophen (Tylenol) as needed for fever. ° °Reduce your risk of any infection by using the same precautions used for avoiding the common cold or flu:  °Wash your hands often with soap and warm water for at least 20 seconds.  If soap and water are not readily available, use an alcohol-based hand sanitizer with at least 60% alcohol.  °If coughing or sneezing, cover your mouth   and nose by coughing or sneezing into the elbow areas of your shirt or  coat, into a tissue or into your sleeve (not your hands). °Avoid shaking hands with others and consider head nods or verbal greetings only. °Avoid touching your eyes, nose, or mouth with unwashed hands.  °Avoid close contact with people who are sick. °Avoid places or events with large numbers of people in one location, like concerts or sporting events. °Carefully consider travel plans you have or are making. °If you are planning any travel outside or inside the [place], visit the CDC's Travelers' Health webpage for the latest health notices. °If you have some symptoms but not all symptoms, continue to monitor at home and seek medical attention if your symptoms worsen. °If you are having a medical emergency, call 911. ° °HOME CARE °Only take medications as instructed by your medical team. °Drink plenty of fluids and get plenty of rest. °A steam or ultrasonic humidifier can help if you have congestion.  ° °GET HELP RIGHT AWAY IF YOU HAVE EMERGENCY WARNING SIGNS** FOR COVID-19. If you or someone is showing any of these signs seek emergency medical care immediately. Call 911 or proceed to your closest emergency facility if: °You develop worsening Lingelbach fever. °Trouble breathing °Bluish lips or face °Persistent pain or pressure in the chest °New confusion °Inability to wake or stay awake °You cough up blood. °Your symptoms become more severe ° °**This list is not all possible symptoms. Contact your medical provider for any symptoms that are sever or concerning to you. ° °MAKE SURE YOU  °Understand these instructions. °Will watch your condition. °Will get help right away if you are not doing well or get worse. ° °Your e-visit answers were reviewed by a board certified advanced clinical practitioner to complete your personal care plan.  Depending on the condition, your plan could have included both over the counter or prescription medications.  If there is a problem please reply once you have received a response from your  provider. ° °Your safety is important to us.  If you have drug allergies check your prescription carefully.   ° °You can use MyChart to ask questions about today's visit, request a non-urgent call back, or ask for a work or school excuse for 24 hours related to this e-Visit. If it has been greater than 24 hours you will need to follow up with your provider, or enter a new e-Visit to address those concerns. °You will get an e-mail in the next two days asking about your experience.  I hope that your e-visit has been valuable and will speed your recovery. Thank you for using e-visits. ° ° °

## 2020-08-20 NOTE — Progress Notes (Signed)
I have spent 5 minutes in review of e-visit questionnaire, review and updating patient chart, medical decision making and response to patient.   Hibba Schram [name] Charlaine Utsey, PA-C    

## 2020-08-22 ENCOUNTER — Encounter: Payer: Self-pay | Admitting: Internal Medicine

## 2020-08-23 NOTE — Telephone Encounter (Signed)
   COVID+ Patient seeking advice for ear pain  and cold symptoms No virtual visit available today  Please advise

## 2020-08-26 ENCOUNTER — Telehealth: Payer: Self-pay

## 2020-08-26 ENCOUNTER — Ambulatory Visit (INDEPENDENT_AMBULATORY_CARE_PROVIDER_SITE_OTHER): Payer: No Typology Code available for payment source | Admitting: Family Medicine

## 2020-08-26 NOTE — Telephone Encounter (Signed)
pt has stated she is still having chest congestion and a persistent productive cough with yellow sputum. Pt states she would like further advice on what to do to help with her cough as she has done the 5 day quarantine.  **Tested POS for COVID on 08/20/2020.

## 2020-08-26 NOTE — Telephone Encounter (Signed)
Is she still using tessalon perles for cough? The cough can linger for weeks so if worsening or any breathing concerns can do visit.

## 2020-08-26 NOTE — Telephone Encounter (Signed)
See below

## 2020-08-27 ENCOUNTER — Other Ambulatory Visit: Payer: Self-pay

## 2020-08-27 ENCOUNTER — Telehealth: Payer: No Typology Code available for payment source | Admitting: Physician Assistant

## 2020-08-27 DIAGNOSIS — U071 COVID-19: Secondary | ICD-10-CM

## 2020-08-27 DIAGNOSIS — R058 Other specified cough: Secondary | ICD-10-CM | POA: Diagnosis not present

## 2020-08-27 MED ORDER — ALBUTEROL SULFATE HFA 108 (90 BASE) MCG/ACT IN AERS
2.0000 | INHALATION_SPRAY | Freq: Four times a day (QID) | RESPIRATORY_TRACT | 0 refills | Status: DC | PRN
  Filled 2020-08-27: qty 8.5, 25d supply, fill #0

## 2020-08-27 MED ORDER — BENZONATATE 100 MG PO CAPS
100.0000 mg | ORAL_CAPSULE | Freq: Three times a day (TID) | ORAL | 0 refills | Status: DC | PRN
  Filled 2020-08-27: qty 30, 10d supply, fill #0

## 2020-08-27 NOTE — Telephone Encounter (Signed)
See my chart message

## 2020-08-27 NOTE — Progress Notes (Signed)
We are sorry that you are not feeling well.  Here is how we plan to help!  Based on your presentation I believe you most likely have A cough due to a virus.  This is called viral bronchitis and is best treated by rest, plenty of fluids and control of the cough.  You may use Ibuprofen or Tylenol as directed to help your symptoms.   Actually, more specifically, it sounds like you may have post-viral cough syndrome. This is a chronic cough after a moderate upper respiratory infection that is caused from residual inflammation in the airway from the previous infection.    In addition you may use A prescription cough medication called Tessalon Perles 100mg . You may take 1-2 capsules every 8 hours as needed for your cough. I will also send in an Albuterol inhaler. You can use this 1-2 puffs every 4-6 hours as needed for shortness of breath, wheezing, or cough.   If these do not help, you may require steroids. I would recommend these be prescribed by your PCP to monitor your sugar levels closely.   From your responses in the eVisit questionnaire you describe inflammation in the upper respiratory tract which is causing a significant cough.  This is commonly called Bronchitis and has four common causes:   Allergies Viral Infections Acid Reflux Bacterial Infection Allergies, viruses and acid reflux are treated by controlling symptoms or eliminating the cause. An example might be a cough caused by taking certain blood pressure medications. You stop the cough by changing the medication. Another example might be a cough caused by acid reflux. Controlling the reflux helps control the cough.  USE OF BRONCHODILATOR ("RESCUE") INHALERS: There is a risk from using your bronchodilator too frequently.  The risk is that over-reliance on a medication which only relaxes the muscles surrounding the breathing tubes can reduce the effectiveness of medications prescribed to reduce swelling and congestion of the tubes  themselves.  Although you feel brief relief from the bronchodilator inhaler, your asthma may actually be worsening with the tubes becoming more swollen and filled with mucus.  This can delay other crucial treatments, such as oral steroid medications. If you need to use a bronchodilator inhaler daily, several times per day, you should discuss this with your provider.  There are probably better treatments that could be used to keep your asthma under control.     HOME CARE Only take medications as instructed by your medical team. Complete the entire course of an antibiotic. Drink plenty of fluids and get plenty of rest. Avoid close contacts especially the very young and the elderly Cover your mouth if you cough or cough into your sleeve. Always remember to wash your hands A steam or ultrasonic humidifier can help congestion.   GET HELP RIGHT AWAY IF: You develop worsening fever. You become short of breath You cough up blood. Your symptoms persist after you have completed your treatment plan MAKE SURE YOU  Understand these instructions. Will watch your condition. Will get help right away if you are not doing well or get worse.    Thank you for choosing an e-visit.  Your e-visit answers were reviewed by a board certified advanced clinical practitioner to complete your personal care plan. Depending upon the condition, your plan could have included both over the counter or prescription medications.  Please review your pharmacy choice. Make sure the pharmacy is open so you can pick up prescription now. If there is a problem, you may contact your provider  through [employer] and have the prescription routed to another pharmacy.  Your safety is important to [place]. If you have drug allergies check your prescription carefully.   For the next 24 hours you can use MyChart to ask questions about today's visit, request a non-urgent call back, or ask for a work or school excuse. You will get an email  in the next two days asking about your experience. I hope that your e-visit has been valuable and will speed your recovery.  I provided 7 minutes of non face-to-face time during this encounter for chart review and documentation.

## 2020-08-28 ENCOUNTER — Other Ambulatory Visit: Payer: Self-pay

## 2020-08-28 NOTE — Telephone Encounter (Signed)
Seeking advice   Patient calling to report continued cough. Patient had an E-visit on 8/2, prescribed an inhaler to use. Patient states benzonatate (TESSALON) 100 MG capsule did not help cough

## 2020-08-28 NOTE — Telephone Encounter (Signed)
See below

## 2020-08-29 ENCOUNTER — Telehealth: Payer: No Typology Code available for payment source | Admitting: Internal Medicine

## 2020-08-29 ENCOUNTER — Telehealth: Payer: Self-pay | Admitting: Internal Medicine

## 2020-08-29 ENCOUNTER — Other Ambulatory Visit: Payer: Self-pay

## 2020-08-29 ENCOUNTER — Telehealth: Payer: No Typology Code available for payment source | Admitting: Physician Assistant

## 2020-08-29 DIAGNOSIS — B9689 Other specified bacterial agents as the cause of diseases classified elsewhere: Secondary | ICD-10-CM

## 2020-08-29 DIAGNOSIS — J208 Acute bronchitis due to other specified organisms: Secondary | ICD-10-CM

## 2020-08-29 MED ORDER — AZITHROMYCIN 250 MG PO TABS
ORAL_TABLET | ORAL | 0 refills | Status: AC
  Filled 2020-08-29: qty 6, 5d supply, fill #0

## 2020-08-29 NOTE — Progress Notes (Signed)
Virtual Visit via Video Note  I connected with Samantha Lucas on 08/29/20 at  9:40 AM EDT by a video enabled telemedicine application however patient did not show and was unavailable via phone. A voicemail was left.   Myrlene Broker, MD

## 2020-08-29 NOTE — Progress Notes (Signed)

## 2020-08-29 NOTE — Progress Notes (Signed)
I have spent 5 minutes in review of e-visit questionnaire, review and updating patient chart, medical decision making and response to patient.   Samantha Ow [name] Jonell Brumbaugh, PA-C    

## 2020-08-29 NOTE — Telephone Encounter (Signed)
Schedule visit

## 2020-08-29 NOTE — Telephone Encounter (Signed)
Patient missed VV this morning..says she was sleep & didn't know appt had been scheduled bc she got the MyChart message late   Patient is still seeking rx for COVID+   Wants to know if she can have another VV scheduled for late this afternoon or when theres an opening available?  Please call [phone]

## 2020-08-29 NOTE — Telephone Encounter (Signed)
Patient offered virtual visit for 8/5 Patient declined, patient plans to schedule E- visit thru [facility]

## 2020-09-02 ENCOUNTER — Encounter (INDEPENDENT_AMBULATORY_CARE_PROVIDER_SITE_OTHER): Payer: Self-pay

## 2020-09-02 ENCOUNTER — Ambulatory Visit (INDEPENDENT_AMBULATORY_CARE_PROVIDER_SITE_OTHER): Payer: No Typology Code available for payment source | Admitting: Family Medicine

## 2020-09-09 ENCOUNTER — Encounter (INDEPENDENT_AMBULATORY_CARE_PROVIDER_SITE_OTHER): Payer: Self-pay | Admitting: Family Medicine

## 2020-09-09 ENCOUNTER — Other Ambulatory Visit: Payer: Self-pay

## 2020-09-09 ENCOUNTER — Ambulatory Visit (INDEPENDENT_AMBULATORY_CARE_PROVIDER_SITE_OTHER): Payer: No Typology Code available for payment source | Admitting: Family Medicine

## 2020-09-09 VITALS — BP 118/73 | HR 72 | Temp 98.3°F | Ht 69.0 in | Wt 240.0 lb

## 2020-09-09 DIAGNOSIS — Z9189 Other specified personal risk factors, not elsewhere classified: Secondary | ICD-10-CM

## 2020-09-09 DIAGNOSIS — Z6836 Body mass index (BMI) 36.0-36.9, adult: Secondary | ICD-10-CM

## 2020-09-09 DIAGNOSIS — E1169 Type 2 diabetes mellitus with other specified complication: Secondary | ICD-10-CM

## 2020-09-09 DIAGNOSIS — Z794 Long term (current) use of insulin: Secondary | ICD-10-CM | POA: Diagnosis not present

## 2020-09-09 DIAGNOSIS — E559 Vitamin D deficiency, unspecified: Secondary | ICD-10-CM | POA: Diagnosis not present

## 2020-09-09 MED ORDER — SEMAGLUTIDE 14 MG PO TABS
1.0000 | ORAL_TABLET | Freq: Every day | ORAL | 0 refills | Status: DC
  Filled 2020-09-09: qty 30, 30d supply, fill #0

## 2020-09-09 MED ORDER — LISINOPRIL 2.5 MG PO TABS
ORAL_TABLET | ORAL | 0 refills | Status: DC
  Filled 2020-09-09: qty 30, 30d supply, fill #0

## 2020-09-09 MED ORDER — VITAMIN D (ERGOCALCIFEROL) 1.25 MG (50000 UNIT) PO CAPS
ORAL_CAPSULE | ORAL | 0 refills | Status: DC
  Filled 2020-09-09: qty 4, 28d supply, fill #0

## 2020-09-10 ENCOUNTER — Encounter (INDEPENDENT_AMBULATORY_CARE_PROVIDER_SITE_OTHER): Payer: Self-pay | Admitting: Family Medicine

## 2020-09-10 NOTE — Progress Notes (Signed)
Chief Complaint:   OBESITY Samantha Lucas is here to discuss her progress with her obesity treatment plan along with follow-up of her obesity related diagnoses. Samantha Lucas is on the Category 2 Plan or keeping a food journal and adhering to recommended goals of 1500-1700 calories and 120 grams of protein and states she is following her eating plan approximately 70% of the time. Samantha Lucas states she is doing gym exercise and you tube for 60 minutes 3 times per week.  Today's visit was #: 20 Starting weight: 243 lbs Starting date: 07/18/2019 Today's weight: 240 lbs Today's date: 09/09/2020 Total lbs lost to date: 3 lbs Total lbs lost since last in-office visit: 0  Interim History: Samantha Lucas is on plan breakfast and lunch but then eats fast food for dinner most days. She does not want to cook when she gets home from work at 8:30 pm. She denies excessive hunger or stress eating. Subjective:   1. Type 2 diabetes mellitus with other specified complication, with long-term current use of insulin (HCC) Samantha Lucas's diabetes mellitus is not well controlled. Her A1C is 7.3. She is on Tresiba, glipiZide and Rybelsus. Her A1C was at Samantha Lucas as 12 a few years ago (May 2021).  Lab Results  Component Value Date   HGBA1C 7.3 (H) 06/26/2020   HGBA1C 8.1 (H) 01/16/2020   HGBA1C 8.4 (H) 10/04/2019   Lab Results  Component Value Date   MICROALBUR <0.2 06/06/2019   LDLCALC 99 10/04/2019   CREATININE 0.60 01/16/2020   No results found for: INSULIN   2. Vitamin D deficiency Samantha Lucas's Vitamin D is slightly low at 42.2. She is on weekly prescription Vitamin D.  Lab Results  Component Value Date   VD25OH 42.2 06/26/2020   VD25OH 47.6 10/30/2019   VD25OH 22.4 (L) 07/18/2019    3. At risk for hyperglycemia Samantha Lucas is at risk for hyperglycemia due to uncontrolled diabetes mellitus.  Assessment/Plan:   1. Type 2 diabetes mellitus with other specified complication, with long-term current We will refill Rybelsus 14 mg daily for  1 month with no refills. We will refill Zestril 2.5 mg daily for 1 month with no refills.  - lisinopril (ZESTRIL) 2.5 MG tablet; TAKE 1 TABLET BY MOUTH EVERY DAY TO PROTECT KIDNEYS  Dispense: 30 tablet; Refill: 0  - Semaglutide 14 MG TABS; TAKE 1 TABLET BY MOUTH ONCE DAILY  Dispense: 30 tablet; Refill: 0  2. Vitamin D deficiency  We will refill prescription Vitamin D 50,000 IU every week and Samantha Lucas will follow-up for routine testing of Vitamin D, at least 2-3 times per year to avoid over-replacement.  - Vitamin D, Ergocalciferol, (DRISDOL) 1.25 MG (50000 UNIT) CAPS capsule; TAKE 1 CAPSULE BY MOUTH EVERY 7 (SEVEN) DAYS.  Dispense: 4 capsule; Refill: 0  3. At risk for hyperglycemia Samantha Lucas was given approximately 15 minutes of counseling today regarding prevention of hyperglycemia. She was advised of hyperglycemia causes and the fact hyperglycemia is often asymptomatic. Samantha Lucas was instructed to avoid skipping meals, eat regular protein rich meals and schedule low calorie but protein rich snacks as needed.   Repetitive spaced learning was employed today to elicit superior memory formation and behavioral change   4. Obesit: Current BMI BMI 35.43 Samantha Lucas is currently in the action stage of change. As such, her goal is to continue with weight loss efforts. She has agreed to the Category 2 Plan and keeping a food journal and adhering to recommended goals of 400-600 calories and 35 grams of protein.  Samantha Lucas will eat out only 3 days per week. I discussed with her dinner options that don't require cooking.  Exercise goals:  As is.  Behavioral modification strategies: increasing lean protein intake and meal planning and cooking strategies.  Samantha Lucas has agreed to follow-up with our clinic in 2-3 weeks.  Objective:   Blood pressure 118/73, pulse 72, temperature 98.3 F (36.8 C), height 5\' 9"  (1.753 m), weight 240 lb (108.9 kg), SpO2 99 %. Body mass index is 35.44 kg/m.  General: Cooperative, alert, well  developed, in no acute distress. HEENT: Conjunctivae and lids unremarkable. Cardiovascular: Regular rhythm.  Lungs: Normal work of breathing. Neurologic: No focal deficits.   Lab Results  Component Value Date   CREATININE 0.60 01/16/2020   BUN 16 01/16/2020   NA 138 01/16/2020   K 4.4 01/16/2020   CL 101 01/16/2020   CO2 23 01/16/2020   Lab Results  Component Value Date   ALT 10 01/16/2020   AST 11 01/16/2020   ALKPHOS 127 (H) 01/16/2020   BILITOT 0.3 01/16/2020   Lab Results  Component Value Date   HGBA1C 7.3 (H) 06/26/2020   HGBA1C 8.1 (H) 01/16/2020   HGBA1C 8.4 (H) 10/04/2019   HGBA1C 12.6 (H) 06/06/2019   HGBA1C 11.1 (H) 10/17/2018   No results found for: INSULIN Lab Results  Component Value Date   TSH 2.100 07/18/2019   Lab Results  Component Value Date   CHOL 159 10/04/2019   HDL 44 (L) 10/04/2019   LDLCALC 99 10/04/2019   TRIG 70 10/04/2019   CHOLHDL 3.6 10/04/2019   Lab Results  Component Value Date   VD25OH 42.2 06/26/2020   VD25OH 47.6 10/30/2019   VD25OH 22.4 (L) 07/18/2019   Lab Results  Component Value Date   WBC 9.5 10/04/2019   HGB 13.0 10/04/2019   HCT 41.2 10/04/2019   MCV 81.1 10/04/2019   PLT 369 10/04/2019   Lab Results  Component Value Date   IRON 18 (L) 07/17/2013    Attestation Statements:   Reviewed by clinician on day of visit: allergies, medications, problem list, medical history, surgical history, family history, social history, and previous encounter notes.  I, 07/19/2013, RMA, am acting as [employer] for Energy manager, FNP.   I have reviewed the above documentation for accuracy and completeness, and I agree with the above. -  [place], FNP

## 2020-09-26 ENCOUNTER — Ambulatory Visit (INDEPENDENT_AMBULATORY_CARE_PROVIDER_SITE_OTHER): Payer: No Typology Code available for payment source | Admitting: Family Medicine

## 2020-09-26 ENCOUNTER — Encounter (INDEPENDENT_AMBULATORY_CARE_PROVIDER_SITE_OTHER): Payer: Self-pay | Admitting: Family Medicine

## 2020-09-26 ENCOUNTER — Other Ambulatory Visit: Payer: Self-pay

## 2020-09-26 VITALS — BP 103/65 | HR 66 | Temp 98.9°F | Ht 69.0 in | Wt 245.0 lb

## 2020-09-26 DIAGNOSIS — Z794 Long term (current) use of insulin: Secondary | ICD-10-CM | POA: Diagnosis not present

## 2020-09-26 DIAGNOSIS — E559 Vitamin D deficiency, unspecified: Secondary | ICD-10-CM

## 2020-09-26 DIAGNOSIS — E1169 Type 2 diabetes mellitus with other specified complication: Secondary | ICD-10-CM | POA: Diagnosis not present

## 2020-09-26 DIAGNOSIS — Z9189 Other specified personal risk factors, not elsewhere classified: Secondary | ICD-10-CM | POA: Diagnosis not present

## 2020-09-26 DIAGNOSIS — Z6836 Body mass index (BMI) 36.0-36.9, adult: Secondary | ICD-10-CM

## 2020-09-26 MED ORDER — VITAMIN D (ERGOCALCIFEROL) 1.25 MG (50000 UNIT) PO CAPS
ORAL_CAPSULE | ORAL | 0 refills | Status: DC
  Filled 2020-09-26 – 2020-10-07 (×2): qty 4, 28d supply, fill #0

## 2020-09-26 NOTE — Progress Notes (Signed)
Chief Complaint:   OBESITY Samantha Lucas is here to discuss her progress with her obesity treatment plan along with follow-up of her obesity related diagnoses. Samantha Lucas is on the Category 2 Plan and keeping a food journal and adhering to recommended goals of 400-600 calories and 35 grams of protein at supper daily and states she is following her eating plan approximately 80% of the time. Samantha Lucas states she is doing 0 minutes 0 times per week.  Today's visit was #: 21 Starting weight: 243 lbs Starting date: 07/18/2019 Today's weight: 245 lbs Today's date: 09/26/2020 Total lbs lost to date: 0 Total lbs lost since last in-office visit: 0  Interim History: Kielyn is retaining some fluid today. She has been trying to journaling dinner but she is not able to calculate her calories and protein. She feels her sodium has increased which may contribute to her fluid retention.  Subjective:   1. Type 2 diabetes mellitus with other specified complication, with long-term current use of insulin (HCC) Samantha Lucas is stable on her medications, and she is tolerating Rybelsus without nausea or vomiting. She is working on decreasing simple carbohydrates in her diet.  2. Vitamin D deficiency Samantha Lucas is stable on Vit D, and she denies nausea, vomiting, or muscle weakness.  3. At risk for impaired metabolic function Samantha Lucas is at increased risk for impaired metabolic function if protein decreases.  Assessment/Plan:   1. Type 2 diabetes mellitus with other specified complication, with long-term current use of insulin (HCC) Samantha Lucas with diet and exercise, and we will recheck labs in approximately 1 month. Good blood sugar control is important to decrease the likelihood of diabetic complications such as nephropathy, neuropathy, limb loss, blindness, coronary artery disease, and death. Intensive lifestyle modification including diet, exercise and weight loss are the first line of treatment for diabetes.   2. Vitamin D  deficiency Low Vitamin D level contributes to fatigue and are associated with obesity, breast, and colon cancer. We will refill prescription Vitamin D for 1 month. Samantha Lucas will follow-up for routine testing of Vitamin D, at least 2-3 times per year to avoid over-replacement.  - Vitamin D, Ergocalciferol, (DRISDOL) 1.25 MG (50000 UNIT) CAPS capsule; TAKE 1 CAPSULE BY MOUTH EVERY 7 (SEVEN) DAYS.  Dispense: 4 capsule; Refill: 0  3. At risk for impaired metabolic function Samantha Lucas was given approximately 15 minutes of impaired  metabolic function prevention counseling today. We discussed intensive lifestyle modifications today with an emphasis on specific nutrition and exercise instructions and strategies.   Repetitive spaced learning was employed today to elicit superior memory formation and behavioral change.  4. Obesity with current BMI 36.2 Samantha Lucas is currently in the action stage of change. As such, her goal is to continue with weight loss efforts. She has agreed to the Category 2 Plan and keeping a food journal and adhering to recommended goals of 400-600 calories and 35 grams of protein at supper daily.   Behavioral modification strategies: increasing lean protein intake.  Samantha Lucas has agreed to follow-up with our clinic in 3 weeks. She was informed of the importance of frequent follow-up visits to maximize her success with intensive lifestyle modifications for her multiple health conditions.   Objective:   Blood pressure 103/65, pulse 66, temperature 98.9 F (37.2 C), height 5\' 9"  (1.753 m), weight 245 lb (111.1 kg), SpO2 100 %. Body mass index is 36.18 kg/m.  General: Cooperative, alert, well developed, in no acute distress. HEENT: Conjunctivae and lids unremarkable. Cardiovascular: Regular rhythm.  Lungs:  Normal work of breathing. Neurologic: No focal deficits.   Lab Results  Component Value Date   CREATININE 0.60 01/16/2020   BUN 16 01/16/2020   NA 138 01/16/2020   K 4.4 01/16/2020    CL 101 01/16/2020   CO2 23 01/16/2020   Lab Results  Component Value Date   ALT 10 01/16/2020   AST 11 01/16/2020   ALKPHOS 127 (H) 01/16/2020   BILITOT 0.3 01/16/2020   Lab Results  Component Value Date   HGBA1C 7.3 (H) 06/26/2020   HGBA1C 8.1 (H) 01/16/2020   HGBA1C 8.4 (H) 10/04/2019   HGBA1C 12.6 (H) 06/06/2019   HGBA1C 11.1 (H) 10/17/2018   No results found for: INSULIN Lab Results  Component Value Date   TSH 2.100 07/18/2019   Lab Results  Component Value Date   CHOL 159 10/04/2019   HDL 44 (L) 10/04/2019   LDLCALC 99 10/04/2019   TRIG 70 10/04/2019   CHOLHDL 3.6 10/04/2019   Lab Results  Component Value Date   VD25OH 42.2 06/26/2020   VD25OH 47.6 10/30/2019   VD25OH 22.4 (L) 07/18/2019   Lab Results  Component Value Date   WBC 9.5 10/04/2019   HGB 13.0 10/04/2019   HCT 41.2 10/04/2019   MCV 81.1 10/04/2019   PLT 369 10/04/2019   Lab Results  Component Value Date   IRON 18 (L) 07/17/2013   Attestation Statements:   Reviewed by clinician on day of visit: allergies, medications, problem list, medical history, surgical history, family history, social history, and previous encounter notes.   I, Burt Knack, am acting as transcriptionist for Quillian Quince, MD.  I have reviewed the above documentation for accuracy and completeness, and I agree with the above. -  Quillian Quince, MD

## 2020-10-07 ENCOUNTER — Other Ambulatory Visit: Payer: Self-pay

## 2020-10-07 ENCOUNTER — Other Ambulatory Visit (INDEPENDENT_AMBULATORY_CARE_PROVIDER_SITE_OTHER): Payer: Self-pay | Admitting: Family Medicine

## 2020-10-07 DIAGNOSIS — E1169 Type 2 diabetes mellitus with other specified complication: Secondary | ICD-10-CM

## 2020-10-07 DIAGNOSIS — Z794 Long term (current) use of insulin: Secondary | ICD-10-CM

## 2020-10-07 NOTE — Telephone Encounter (Signed)
Pt last seen by Dr. Beasley.  

## 2020-10-08 ENCOUNTER — Other Ambulatory Visit: Payer: Self-pay

## 2020-10-08 MED FILL — Semaglutide Tab 14 MG: ORAL | 30 days supply | Qty: 30 | Fill #0 | Status: AC

## 2020-10-08 NOTE — Telephone Encounter (Signed)
LAST APPOINTMENT DATE: 09/26/2020 NEXT APPOINTMENT DATE: 10/22/2020   [facility] [address] [place] [address] Phone: [phone] Fax: [phone]  Patient is requesting a refill of the following medications: Requested Prescriptions   Pending Prescriptions Disp Refills   RYBELSUS 14 MG TABS [Pharmacy Med Name: Semaglutide 14 MG Tab] 30 tablet 0    Sig: TAKE 1 TABLET BY MOUTH ONCE DAILY    Date last filled: 09/09/2020 Previously prescribed by Adah Salvage, FNP  Lab Results  Component Value Date   HGBA1C 7.3 (H) 06/26/2020   HGBA1C 8.1 (H) 01/16/2020   HGBA1C 8.4 (H) 10/04/2019   Lab Results  Component Value Date   MICROALBUR <0.2 06/06/2019   LDLCALC 99 10/04/2019   CREATININE 0.60 01/16/2020   Lab Results  Component Value Date   VD25OH 42.2 06/26/2020   VD25OH 47.6 10/30/2019   VD25OH 22.4 (L) 07/18/2019    BP Readings from Last 3 Encounters:  09/26/20 103/65  09/09/20 118/73  08/05/20 116/73

## 2020-10-17 ENCOUNTER — Ambulatory Visit (INDEPENDENT_AMBULATORY_CARE_PROVIDER_SITE_OTHER): Payer: No Typology Code available for payment source | Admitting: Family Medicine

## 2020-10-22 ENCOUNTER — Ambulatory Visit (INDEPENDENT_AMBULATORY_CARE_PROVIDER_SITE_OTHER): Payer: No Typology Code available for payment source | Admitting: Family Medicine

## 2020-10-22 ENCOUNTER — Encounter (INDEPENDENT_AMBULATORY_CARE_PROVIDER_SITE_OTHER): Payer: Self-pay | Admitting: Family Medicine

## 2020-10-22 ENCOUNTER — Other Ambulatory Visit: Payer: Self-pay

## 2020-10-22 VITALS — BP 107/67 | HR 82 | Temp 98.0°F | Ht 69.0 in | Wt 244.0 lb

## 2020-10-22 DIAGNOSIS — Z9189 Other specified personal risk factors, not elsewhere classified: Secondary | ICD-10-CM | POA: Diagnosis not present

## 2020-10-22 DIAGNOSIS — Z6836 Body mass index (BMI) 36.0-36.9, adult: Secondary | ICD-10-CM

## 2020-10-22 DIAGNOSIS — E559 Vitamin D deficiency, unspecified: Secondary | ICD-10-CM | POA: Diagnosis not present

## 2020-10-22 DIAGNOSIS — Z794 Long term (current) use of insulin: Secondary | ICD-10-CM

## 2020-10-22 DIAGNOSIS — E1169 Type 2 diabetes mellitus with other specified complication: Secondary | ICD-10-CM | POA: Diagnosis not present

## 2020-10-22 MED ORDER — LISINOPRIL 2.5 MG PO TABS
ORAL_TABLET | ORAL | 0 refills | Status: DC
  Filled 2020-10-22: qty 30, 30d supply, fill #0

## 2020-10-22 MED ORDER — TIRZEPATIDE 7.5 MG/0.5ML ~~LOC~~ SOAJ
7.5000 mg | SUBCUTANEOUS | 0 refills | Status: DC
  Filled 2020-10-22: qty 6, 84d supply, fill #0

## 2020-10-22 NOTE — Progress Notes (Signed)
Chief Complaint:   OBESITY Samantha Lucas is here to discuss her progress with her obesity treatment plan along with follow-up of her obesity related diagnoses. Samantha Lucas is on the Category 2 Plan and keeping a food journal and adhering to recommended goals of 400-600 calories and 35 grams of protein and states she is following her eating plan approximately 65% of the time. Samantha Lucas states she is doing 0 minutes 0 times per week.  Today's visit was #: 22 Starting weight: 243 lbs Starting date: 07/18/2019 Today's weight: 244 lbs Today's date: 10/22/2020 Total lbs lost to date: 0 Total lbs lost since last in-office visit: 1 lb  Interim History: Samantha Lucas notes she tends to pick up fast food and does not always make good choices when she does. She is on plan at breakfast and lunch.   Subjective:   1. Type 2 diabetes mellitus with other specified complication, with long-term current use of insulin (HCC) Samantha Lucas's last A1C was elevated at 7.3. She is on [place] and Rybelsus. Her CBGs was 80-170. She denies hypoglycemia. She has been on Rybelsus since March 2022 and has gained a few pounds since then.  Lab Results  Component Value Date   HGBA1C 7.3 (H) 06/26/2020   HGBA1C 8.1 (H) 01/16/2020   HGBA1C 8.4 (H) 10/04/2019   Lab Results  Component Value Date   MICROALBUR <0.2 06/06/2019   LDLCALC 99 10/04/2019   CREATININE 0.60 01/16/2020   No results found for: INSULIN   2. Vitamin D deficiency Samantha Lucas Vitamin is low at 23.9. She is on weekly prescription Vitamin D.  Lab Results  Component Value Date   VD25OH 42.2 06/26/2020   VD25OH 47.6 10/30/2019   VD25OH 22.4 (L) 07/18/2019    3. At risk for side effect of medication Samantha Lucas is at risk for side effect of medication due to start of Mounjaro.   Assessment/Plan:   1. Type 2 diabetes mellitus with other specified complication, with long-term current use of insulin (HCC) We will refill Zestril 2.5 mg with no refills. She will discontinue  Rybelsus. Samantha Lucas agrees to start Mounjaro 7.5 mg weekly with no refills. A coupon was provided today.  - lisinopril (ZESTRIL) 2.5 MG tablet; TAKE 1 TABLET BY MOUTH EVERY DAY TO PROTECT KIDNEYS  Dispense: 30 tablet; Refill: 0  - tirzepatide (MOUNJARO) 7.5 MG/0.5ML Pen; Inject 7.5 mg into the skin once a week.  Dispense: 6 mL; Refill: 0  2. Vitamin D deficiency Samantha Lucas will continue prescription Vitamin D 50,000 IU every week and she will follow-up for routine testing of Vitamin D, at least 2-3 times per year to avoid over-replacement.   3. At risk for side effect of medication Samantha Lucas was given approximately 15 minutes of drug side effect counseling today.  We discussed side effect possibility and risk versus benefits. Samantha Lucas agreed to the medication and will contact this office if these side effects are intolerable.  Repetitive spaced learning was employed today to elicit superior memory formation and behavioral change.   4. Obesity with current BMI 36.02 Samantha Lucas is currently in the action stage of change. As such, her goal is to continue with weight loss efforts. She has agreed to the Category 2 Plan and keeping a food journal and adhering to recommended goals of 400-600 calories and 35 grams of protein at supper.   We discussed making better choices when picking up fast food.  Exercise goals: No exercise has been prescribed at this time.  Behavioral modification strategies: increasing lean protein  intake, decreasing simple carbohydrates, and decreasing eating out.  Samantha Lucas has agreed to follow-up with our clinic in 2-3 weeks.  Objective:   Blood pressure 107/67, pulse 82, temperature 98 F (36.7 C), height 5\' 9"  (1.753 m), weight 244 lb (110.7 kg), SpO2 100 %. Body mass index is 36.03 kg/m.  General: Cooperative, alert, well developed, in no acute distress. HEENT: Conjunctivae and lids unremarkable. Cardiovascular: Regular rhythm.  Lungs: Normal work of breathing. Neurologic: No focal  deficits.   Lab Results  Component Value Date   CREATININE 0.60 01/16/2020   BUN 16 01/16/2020   NA 138 01/16/2020   K 4.4 01/16/2020   CL 101 01/16/2020   CO2 23 01/16/2020   Lab Results  Component Value Date   ALT 10 01/16/2020   AST 11 01/16/2020   ALKPHOS 127 (H) 01/16/2020   BILITOT 0.3 01/16/2020   Lab Results  Component Value Date   HGBA1C 7.3 (H) 06/26/2020   HGBA1C 8.1 (H) 01/16/2020   HGBA1C 8.4 (H) 10/04/2019   HGBA1C 12.6 (H) 06/06/2019   HGBA1C 11.1 (H) 10/17/2018   No results found for: INSULIN Lab Results  Component Value Date   TSH 2.100 07/18/2019   Lab Results  Component Value Date   CHOL 159 10/04/2019   HDL 44 (L) 10/04/2019   LDLCALC 99 10/04/2019   TRIG 70 10/04/2019   CHOLHDL 3.6 10/04/2019   Lab Results  Component Value Date   VD25OH 42.2 06/26/2020   VD25OH 47.6 10/30/2019   VD25OH 22.4 (L) 07/18/2019   Lab Results  Component Value Date   WBC 9.5 10/04/2019   HGB 13.0 10/04/2019   HCT 41.2 10/04/2019   MCV 81.1 10/04/2019   PLT 369 10/04/2019   Lab Results  Component Value Date   IRON 18 (L) 07/17/2013   Attestation Statements:   Reviewed by clinician on day of visit: allergies, medications, problem list, medical history, surgical history, family history, social history, and previous encounter notes.  I, 07/19/2013, RMA, am acting as [employer] for Energy manager, FNP.   I have reviewed the above documentation for accuracy and completeness, and I agree with the above. -  [place], FNP

## 2020-10-23 ENCOUNTER — Other Ambulatory Visit: Payer: Self-pay

## 2020-10-30 ENCOUNTER — Telehealth: Payer: Self-pay | Admitting: Nurse Practitioner

## 2020-10-30 NOTE — Telephone Encounter (Signed)
Pts mother was calling to see if you would accept her back as your pt?  Mother would like to have a call back.

## 2020-10-30 NOTE — Telephone Encounter (Signed)
I am unable to accept her as a patient at this time.  Please give her a list of PCPs accepting new patients in her area if she has not established with anyone yet. 

## 2020-11-05 ENCOUNTER — Ambulatory Visit (INDEPENDENT_AMBULATORY_CARE_PROVIDER_SITE_OTHER): Payer: No Typology Code available for payment source | Admitting: Family Medicine

## 2020-11-05 ENCOUNTER — Encounter (INDEPENDENT_AMBULATORY_CARE_PROVIDER_SITE_OTHER): Payer: Self-pay | Admitting: Family Medicine

## 2020-11-05 ENCOUNTER — Other Ambulatory Visit: Payer: Self-pay

## 2020-11-05 VITALS — BP 135/72 | HR 73 | Temp 97.8°F | Ht 69.0 in | Wt 241.0 lb

## 2020-11-05 DIAGNOSIS — E1169 Type 2 diabetes mellitus with other specified complication: Secondary | ICD-10-CM | POA: Diagnosis not present

## 2020-11-05 DIAGNOSIS — Z6836 Body mass index (BMI) 36.0-36.9, adult: Secondary | ICD-10-CM

## 2020-11-05 DIAGNOSIS — Z794 Long term (current) use of insulin: Secondary | ICD-10-CM | POA: Diagnosis not present

## 2020-11-05 DIAGNOSIS — Z9189 Other specified personal risk factors, not elsewhere classified: Secondary | ICD-10-CM | POA: Diagnosis not present

## 2020-11-05 DIAGNOSIS — E559 Vitamin D deficiency, unspecified: Secondary | ICD-10-CM

## 2020-11-05 MED ORDER — VITAMIN D (ERGOCALCIFEROL) 1.25 MG (50000 UNIT) PO CAPS
ORAL_CAPSULE | ORAL | 0 refills | Status: DC
  Filled 2020-11-05: qty 4, 28d supply, fill #0

## 2020-11-05 MED ORDER — LISINOPRIL 2.5 MG PO TABS
2.5000 mg | ORAL_TABLET | Freq: Every day | ORAL | 1 refills | Status: DC
  Filled 2020-11-05 – 2020-11-24 (×3): qty 90, 90d supply, fill #0
  Filled 2021-03-14: qty 90, 90d supply, fill #1

## 2020-11-06 NOTE — Progress Notes (Signed)
Chief Complaint:   OBESITY Samantha Lucas is here to discuss her progress with her obesity treatment plan along with follow-up of her obesity related diagnoses. Samantha Lucas is on the Category 2 Plan and keeping a food journal and adhering to recommended goals of 400-600 calories and 35 grams of protein with dinner and states she is following her eating plan approximately 70% of the time. [name] states she is doing 0 minutes 0 times per week.  Today's visit was #: 23 Starting weight: 243 lbs Starting date: 07/18/2019 Today's weight: 241 lbs Today's date: 11/05/2020 Total lbs lost to date: 2 lbs Total lbs lost since last in-office visit: 0  Interim History: Samantha Lucas's protein and water intake is good. She is cooking more at home. She celebrated her birthday recently.  She did not feel she did well on plan but lost 3 lbs.   Subjective:   1. Type 2 diabetes mellitus with other specified complication, with long-term current use of insulin (HCC) Evadean's last A1C was elevated at 7.3. She is on Mounjaro 7.5 mg, Tresiba and Glucotrol 5mg  twice daily. She takes the only 2 times per week. She takes [place] only when she eats poorly. She checks CBG only a few days per week. Her fasting (ranges 120-160). She denies hypoglycemia. She got 3 months of Mounjaro at last refill.   Lab Results  Component Value Date   HGBA1C 7.3 (H) 06/26/2020   HGBA1C 8.1 (H) 01/16/2020   HGBA1C 8.4 (H) 10/04/2019   Lab Results  Component Value Date   MICROALBUR <0.2 06/06/2019   LDLCALC 99 10/04/2019   CREATININE 0.60 01/16/2020   No results found for: INSULIN   2. Vitamin D deficiency Samantha Lucas's Vitamin D was low at 42.2. Her goal is 50. She is on weekly prescription Vitamin D.  Lab Results  Component Value Date   VD25OH 42.2 06/26/2020   VD25OH 47.6 10/30/2019   VD25OH 22.4 (L) 07/18/2019    3. At risk for hyperglycemia Samantha Lucas is at risk for hyperglycemia due to non compliant with 07/20/2019.  Assessment/Plan:    1. Type 2 diabetes mellitus with other specified complication, with long-term current use of insulin (HCC) Mikeisha will continue all medications. We will check labs on next office visit. She will take [place] as directed. If her A1C improves, she may remove [place] from regimen. We will refill Lisinopril 2.5 mg daily. She is on this for renal protection. She denies HTN.  - lisinopril (ZESTRIL) 2.5 MG tablet; Take 1 tablet (2.5 mg total) by mouth daily.  Dispense: 90 tablet; Refill: 1  2. Vitamin D deficiency Samantha Lucas agrees to continue to take prescription Vitamin D 50,000 IU every week and she will follow-up for routine testing of Vitamin D, at least 2-3 times per year to avoid over-replacement.  - Vitamin D, Ergocalciferol, (DRISDOL) 1.25 MG (50000 UNIT) CAPS capsule; TAKE 1 CAPSULE BY MOUTH EVERY 7 (SEVEN) DAYS.  Dispense: 4 capsule; Refill: 0  3. At risk for hyperglycemia Samantha Lucas was given approximately 15 minutes of counseling today regarding prevention of hyperglycemia. She was advised of hyperglycemia causes and the fact hyperglycemia is often asymptomatic. Lumen was instructed to avoid skipping meals, eat regular protein rich meals and schedule low calorie but protein rich snacks as needed.   Repetitive spaced learning was employed today to elicit superior memory formation and behavioral change   4. Obesity with current BMI 35.57 Samantha Lucas is currently in the action stage of change. As such, her goal is to  continue with weight loss efforts. She has agreed to the Category 2 Plan.   Exercise goals: No exercise has been prescribed at this time.  Behavioral modification strategies: increasing lean protein intake and decreasing simple carbohydrates.  Samantha Lucas has agreed to follow-up with our clinic in 3 weeks (fasting).  Objective:   Blood pressure 135/72, pulse 73, temperature 97.8 F (36.6 C), height 5\' 9"  (1.753 m), weight 241 lb (109.3 kg), SpO2 97 %. Body mass index is 35.59  kg/m.  General: Cooperative, alert, well developed, in no acute distress. HEENT: Conjunctivae and lids unremarkable. Cardiovascular: Regular rhythm.  Lungs: Normal work of breathing. Neurologic: No focal deficits.   Lab Results  Component Value Date   CREATININE 0.60 01/16/2020   BUN 16 01/16/2020   NA 138 01/16/2020   K 4.4 01/16/2020   CL 101 01/16/2020   CO2 23 01/16/2020   Lab Results  Component Value Date   ALT 10 01/16/2020   AST 11 01/16/2020   ALKPHOS 127 (H) 01/16/2020   BILITOT 0.3 01/16/2020   Lab Results  Component Value Date   HGBA1C 7.3 (H) 06/26/2020   HGBA1C 8.1 (H) 01/16/2020   HGBA1C 8.4 (H) 10/04/2019   HGBA1C 12.6 (H) 06/06/2019   HGBA1C 11.1 (H) 10/17/2018   No results found for: INSULIN Lab Results  Component Value Date   TSH 2.100 07/18/2019   Lab Results  Component Value Date   CHOL 159 10/04/2019   HDL 44 (L) 10/04/2019   LDLCALC 99 10/04/2019   TRIG 70 10/04/2019   CHOLHDL 3.6 10/04/2019   Lab Results  Component Value Date   VD25OH 42.2 06/26/2020   VD25OH 47.6 10/30/2019   VD25OH 22.4 (L) 07/18/2019   Lab Results  Component Value Date   WBC 9.5 10/04/2019   HGB 13.0 10/04/2019   HCT 41.2 10/04/2019   MCV 81.1 10/04/2019   PLT 369 10/04/2019   Lab Results  Component Value Date   IRON 18 (L) 07/17/2013   Attestation Statements:   Reviewed by clinician on day of visit: allergies, medications, problem list, medical history, surgical history, family history, social history, and previous encounter notes.  I, 07/19/2013, RMA, am acting as [employer] for Energy manager, FNP.   I have reviewed the above documentation for accuracy and completeness, and I agree with the above. -  [place], FNP

## 2020-11-08 ENCOUNTER — Other Ambulatory Visit: Payer: Self-pay

## 2020-11-25 ENCOUNTER — Other Ambulatory Visit: Payer: Self-pay

## 2020-11-27 ENCOUNTER — Ambulatory Visit (INDEPENDENT_AMBULATORY_CARE_PROVIDER_SITE_OTHER): Payer: No Typology Code available for payment source | Admitting: Family Medicine

## 2020-12-09 ENCOUNTER — Other Ambulatory Visit: Payer: Self-pay

## 2020-12-09 MED FILL — Glipizide Tab 5 MG: ORAL | 90 days supply | Qty: 180 | Fill #1 | Status: AC

## 2020-12-10 ENCOUNTER — Telehealth (INDEPENDENT_AMBULATORY_CARE_PROVIDER_SITE_OTHER): Payer: Self-pay

## 2020-12-10 NOTE — Telephone Encounter (Signed)
Pt called in and stated that she needs a refill on her Vitamin D. Please advise

## 2020-12-10 NOTE — Telephone Encounter (Signed)
Pt last seen by Dawn Whitmire, FNP.  

## 2020-12-16 ENCOUNTER — Other Ambulatory Visit: Payer: Self-pay

## 2020-12-16 NOTE — Telephone Encounter (Signed)
Was this addressed?

## 2020-12-20 ENCOUNTER — Other Ambulatory Visit: Payer: Self-pay

## 2020-12-20 ENCOUNTER — Ambulatory Visit (INDEPENDENT_AMBULATORY_CARE_PROVIDER_SITE_OTHER): Payer: No Typology Code available for payment source

## 2020-12-20 ENCOUNTER — Ambulatory Visit
Admission: EM | Admit: 2020-12-20 | Discharge: 2020-12-20 | Disposition: A | Discharge status: Home / Self Care | Payer: No Typology Code available for payment source | Attending: Urgent Care | Admitting: Urgent Care

## 2020-12-20 DIAGNOSIS — M25561 Pain in right knee: Secondary | ICD-10-CM | POA: Diagnosis not present

## 2020-12-20 DIAGNOSIS — Z794 Long term (current) use of insulin: Secondary | ICD-10-CM | POA: Diagnosis not present

## 2020-12-20 DIAGNOSIS — E119 Type 2 diabetes mellitus without complications: Secondary | ICD-10-CM

## 2020-12-20 MED ORDER — NAPROXEN 500 MG PO TABS: 500.0000 mg | ORAL_TABLET | Freq: Two times a day (BID) | ORAL | 0 refills | Status: DC

## 2020-12-20 NOTE — ED Provider Notes (Signed)
[facility]   MRN: [id number] DOB: [birth year]  Subjective:   Samantha Lucas is a 30 y.o. female presenting for 1 day history of acute onset persistent and worsening right medial knee pain with swelling.  No fall, trauma, bruising, knee buckling.  No history of musculoskeletal disorders.  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  Has type 2 diabetes treated with insulin.  No current facility-administered medications for this encounter.  Current Outpatient Medications:    albuterol (VENTOLIN HFA) 108 (90 Base) MCG/ACT inhaler, Inhale 2 puffs into the lungs every 6 (six) hours as needed for wheezing or shortness of breath., Disp: 8.5 g, Rfl: 0   benzonatate (TESSALON) 100 MG capsule, Take 1 capsule (100 mg total) by mouth 3 (three) times daily as needed for cough., Disp: 30 capsule, Rfl: 0   fluticasone (FLONASE) 50 MCG/ACT nasal spray, Place 2 sprays into both nostrils daily., Disp: 16 g, Rfl: 6   glipiZIDE (GLUCOTROL) 5 MG tablet, TAKE 1 TABLET BY MOUTH TWICE DAILY BEFORE A MEAL., Disp: 180 tablet, Rfl: 3   Glucose Blood (BLOOD GLUCOSE TEST STRIPS) STRP, Use as directed to monitor FSBS 2x daily. Dx: E11.9., Disp: 100 each, Rfl: 11   insulin degludec (TRESIBA) 100 UNIT/ML FlexTouch Pen, INJECT 10 UNITS INTO THE SKIN AT BEDTIME., Disp: 15 mL, Rfl: 11   lisinopril (ZESTRIL) 2.5 MG tablet, Take 1 tablet (2.5 mg total) by mouth daily., Disp: 90 tablet, Rfl: 1   Multiple Vitamin (MULITIVITAMIN WITH MINERALS) TABS, Take 1 tablet by mouth daily., Disp: , Rfl:    tirzepatide (MOUNJARO) 7.5 MG/0.5ML Pen, Inject 7.5 mg into the skin once a week., Disp: 6 mL, Rfl: 0   vitamin C (ASCORBIC ACID) 500 MG tablet, Take 500 mg by mouth daily., Disp: , Rfl:    Vitamin D, Ergocalciferol, (DRISDOL) 1.25 MG (50000 UNIT) CAPS capsule, TAKE 1 CAPSULE BY MOUTH EVERY 7 (SEVEN) DAYS., Disp: 4 capsule, Rfl: 0   Allergies  Allergen  Reactions   Metformin And Related     GI upset    Other     [place]     Past Medical History:  Diagnosis Date   Back pain    Diabetes mellitus without complication [facility]) May 2014   Scoliosis (and kyphoscoliosis), idiopathic    Viral cardiomyopathy (HCC) 2016     Past Surgical History:  Procedure Laterality Date   WISDOM TOOTH EXTRACTION  dec 2012    Family History  Problem Relation Age of Onset   Hypertension Mother    Cancer Mother    Obesity Mother    Stroke Father    Hypertension Father    Diabetes Father    Cancer Paternal Grandmother        lung and breast cancer   Hypertension Paternal Grandmother     Social History   Tobacco Use   Smoking status: Never   Smokeless tobacco: Never  Vaping Use   Vaping Use: Never used  Substance Use Topics   Alcohol use: No   Drug use: No    ROS   Objective:   Vitals: BP 114/74 (BP Location: Right Arm)   Pulse 88   Temp 98.1 F (36.7 C) (Oral)   Resp 18   LMP 11/29/2020 (Exact Date)   SpO2 95%   Physical Exam Constitutional:      General: She is not in acute distress.    Appearance: Normal appearance. She is well-developed.  She is not ill-appearing, toxic-appearing or diaphoretic.  HENT:     Head: Normocephalic and atraumatic.     Nose: Nose normal.     Mouth/Throat:     Mouth: Mucous membranes are moist.     Pharynx: Oropharynx is clear.  Eyes:     General: No scleral icterus.    Extraocular Movements: Extraocular movements intact.     Pupils: Pupils are equal, round, and reactive to light.  Cardiovascular:     Rate and Rhythm: Normal rate.  Pulmonary:     Effort: Pulmonary effort is normal.  Musculoskeletal:     Right knee: Swelling present. No deformity, effusion, erythema, ecchymosis, lacerations, bony tenderness or crepitus. Decreased range of motion (Slight decrease in extension flexion). Tenderness present over the medial joint line. No lateral joint line or patellar tendon tenderness. Normal  alignment and normal patellar mobility.     Comments: Strength 5/5 for lower extremities.  Skin:    General: Skin is warm and dry.  Neurological:     General: No focal deficit present.     Mental Status: She is alert and oriented to person, place, and time.     Motor: No weakness.     Coordination: Coordination normal.     Gait: Gait normal.  Psychiatric:        Mood and Affect: Mood normal.        Behavior: Behavior normal.    DG Knee Complete 4 Views Right  Result Date: 12/20/2020 CLINICAL DATA:  Right knee pain EXAM: RIGHT KNEE - COMPLETE 4+ VIEW COMPARISON:  None. FINDINGS: No evidence of fracture, dislocation, or joint effusion. No evidence of arthropathy or other focal bone abnormality. Lobulated soft tissue structure along the medial aspect of the knee is likely varicose vein. IMPRESSION: No acute osseous abnormality of the right knee. Electronically Signed   By: Acquanetta Belling M.D.   On: 12/20/2020 16:55     Assessment and Plan :   PDMP not reviewed this encounter.  1. Acute pain of right knee   2. Type 2 diabetes mellitus treated with insulin (HCC)    Suspect inflammatory type pain secondary to overuse due to the nature of her work.  Recommended conservative management using RICE method, applied a 4 inch Ace wrap to the right knee.  Naproxen for pain and inflammation. Counseled patient on potential for adverse effects with medications prescribed/recommended today, ER and return-to-clinic precautions discussed, patient verbalized understanding.    Wallis Bamberg, PA-C 12/20/20 1659

## 2020-12-20 NOTE — ED Triage Notes (Signed)
Patient states that her right leg ( inside corner of the knee cap) started hurting at work yesterday. No injuries to that leg that she is aware of.

## 2021-01-09 ENCOUNTER — Encounter (INDEPENDENT_AMBULATORY_CARE_PROVIDER_SITE_OTHER): Payer: Self-pay | Admitting: Family Medicine

## 2021-01-09 ENCOUNTER — Other Ambulatory Visit: Payer: Self-pay

## 2021-01-09 ENCOUNTER — Ambulatory Visit (INDEPENDENT_AMBULATORY_CARE_PROVIDER_SITE_OTHER): Payer: No Typology Code available for payment source | Admitting: Family Medicine

## 2021-01-09 VITALS — BP 123/78 | HR 74 | Temp 98.0°F | Ht 69.0 in | Wt 238.0 lb

## 2021-01-09 DIAGNOSIS — Z6836 Body mass index (BMI) 36.0-36.9, adult: Secondary | ICD-10-CM

## 2021-01-09 DIAGNOSIS — Z794 Long term (current) use of insulin: Secondary | ICD-10-CM | POA: Diagnosis not present

## 2021-01-09 DIAGNOSIS — E559 Vitamin D deficiency, unspecified: Secondary | ICD-10-CM

## 2021-01-09 DIAGNOSIS — E1169 Type 2 diabetes mellitus with other specified complication: Secondary | ICD-10-CM

## 2021-01-09 MED ORDER — VITAMIN D (ERGOCALCIFEROL) 1.25 MG (50000 UNIT) PO CAPS
ORAL_CAPSULE | ORAL | 0 refills | Status: DC
  Filled 2021-01-09: qty 4, 28d supply, fill #0

## 2021-01-09 MED ORDER — TIRZEPATIDE 7.5 MG/0.5ML ~~LOC~~ SOAJ
7.5000 mg | SUBCUTANEOUS | 0 refills | Status: DC
  Filled 2021-01-09 – 2021-05-28 (×2): qty 6, 84d supply, fill #0

## 2021-01-09 NOTE — Progress Notes (Signed)
Chief Complaint:   OBESITY Samantha Lucas is here to discuss her progress with her obesity treatment plan along with follow-up of her obesity related diagnoses. Samantha Lucas is on the Category 2 Plan and states she is following her eating plan approximately 60% of the time. Samantha Lucas states she is not currently exercising.  Today's visit was #: 24 Starting weight: 243 lbs Starting date: 07/18/2019 Today's weight: 238 lbs Today's date: 01/09/2021 Total lbs lost to date: 5 Total lbs lost since last in-office visit: 3  Interim History: Samantha Lucas enjoys this meal plan. Breakfast and lunch are packed everyday and supper is still up in the air. Her grandmother is in the hospital in ICU and her nephew was killed previously. She wants to change her full time status to part time. 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 Pt wants to cook more for her supper.  Subjective:   1. Type 2 diabetes mellitus with other specified complication, with long-term current use of insulin (HCC) Pt's blood sugars are still ok- last night it was 117. She has had 1 hypoglycemic episode. Pt is only taking insulin when she eats a Samantha Lucas carb meal.  2. Vitamin D deficiency Pt denies nausea, vomiting, and muscle weakness but notes fatigue. She is on prescription Vit D.  Assessment/Plan:   1. Type 2 diabetes mellitus with other specified complication, with long-term current use of insulin (HCC) Good blood sugar control is important to decrease the likelihood of diabetic complications such as nephropathy, neuropathy, limb loss, blindness, coronary artery disease, and death. Intensive lifestyle modification including diet, exercise and weight loss are the first line of treatment for diabetes. Repeat labs at next appt. Instructions given on how to decrease glipizide if fasting blood sugars are <125.  Refill- tirzepatide (MOUNJARO) 7.5 MG/0.5ML Pen; Inject 7.5 mg into the skin once a week.  Dispense: 6 mL; Refill: 0  2. Vitamin D  deficiency Low Vitamin D level contributes to fatigue and are associated with obesity, breast, and colon cancer. She agrees to continue to take prescription Vitamin D 50,000 IU every week and will follow-up for routine testing of Vitamin D, at least 2-3 times per year to avoid over-replacement. Will need labs today.  Refill- Vitamin D, Ergocalciferol, (DRISDOL) 1.25 MG (50000 UNIT) CAPS capsule; TAKE 1 CAPSULE BY MOUTH EVERY 7 (SEVEN) DAYS.  Dispense: 4 capsule; Refill: 0  3. Obesity with current BMI 35.1  Samantha Lucas is currently in the action stage of change. As such, her goal is to continue with weight loss efforts. She has agreed to the Category 2 Plan (working toward category 3).   Exercise goals: All adults should avoid inactivity. Some physical activity is better than none, and adults who participate in any amount of physical activity gain some health benefits.  Behavioral modification strategies: increasing lean protein intake, meal planning and cooking strategies, keeping healthy foods in the home, holiday eating strategies , and planning for success.  Samantha Lucas has agreed to follow-up with our clinic PRN. She was informed of the importance of frequent follow-up visits to maximize her success with intensive lifestyle modifications for her multiple health conditions.   Objective:   Blood pressure 123/78, pulse 74, temperature 98 F (36.7 C), height 5\' 9"  (1.753 m), weight 238 lb (108 kg), last menstrual period 12/23/2020, SpO2 100 %. Body mass index is 35.15 kg/m.  General: Cooperative, alert, well developed, in no acute distress. HEENT: Conjunctivae and lids unremarkable. Cardiovascular: Regular rhythm.  Lungs: Normal work of breathing.  Neurologic: No focal deficits.   Lab Results  Component Value Date   CREATININE 0.60 01/16/2020   BUN 16 01/16/2020   NA 138 01/16/2020   K 4.4 01/16/2020   CL 101 01/16/2020   CO2 23 01/16/2020   Lab Results  Component Value Date   ALT 10  01/16/2020   AST 11 01/16/2020   ALKPHOS 127 (H) 01/16/2020   BILITOT 0.3 01/16/2020   Lab Results  Component Value Date   HGBA1C 7.3 (H) 06/26/2020   HGBA1C 8.1 (H) 01/16/2020   HGBA1C 8.4 (H) 10/04/2019   HGBA1C 12.6 (H) 06/06/2019   HGBA1C 11.1 (H) 10/17/2018   No results found for: INSULIN Lab Results  Component Value Date   TSH 2.100 07/18/2019   Lab Results  Component Value Date   CHOL 159 10/04/2019   HDL 44 (L) 10/04/2019   LDLCALC 99 10/04/2019   TRIG 70 10/04/2019   CHOLHDL 3.6 10/04/2019   Lab Results  Component Value Date   VD25OH 42.2 06/26/2020   VD25OH 47.6 10/30/2019   VD25OH 22.4 (L) 07/18/2019   Lab Results  Component Value Date   WBC 9.5 10/04/2019   HGB 13.0 10/04/2019   HCT 41.2 10/04/2019   MCV 81.1 10/04/2019   PLT 369 10/04/2019   Lab Results  Component Value Date   IRON 18 (L) 07/17/2013   Attestation Statements:   Reviewed by clinician on day of visit: allergies, medications, problem list, medical history, surgical history, family history, social history, and previous encounter notes.  Edmund Hilda, CMA, am acting as transcriptionist for Reuben Likes, MD.   I have reviewed the above documentation for accuracy and completeness, and I agree with the above. - Reuben Likes, MD

## 2021-01-14 ENCOUNTER — Other Ambulatory Visit: Payer: Self-pay

## 2021-02-06 ENCOUNTER — Telehealth: Payer: Self-pay | Admitting: Internal Medicine

## 2021-02-06 NOTE — Telephone Encounter (Signed)
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

## 2021-02-06 NOTE — Telephone Encounter (Signed)
Immunization form is placed in your box for signature. Immunization record is attached to the back as requested by the pt.

## 2021-02-07 NOTE — Telephone Encounter (Signed)
Spoke with the pt to let her know that her paperwork has been completed. Pt's mom is here for a appt with Dr. Okey Dupre. Haely has given verbal permission to give her mother her paperwork. Paperwork was placed in a envelope and given to the mother.

## 2021-02-12 ENCOUNTER — Other Ambulatory Visit
Admission: RE | Admit: 2021-02-12 | Discharge: 2021-02-12 | Disposition: A | Discharge status: Home / Self Care | Payer: No Typology Code available for payment source | Source: Ambulatory Visit | Attending: Internal Medicine | Admitting: Internal Medicine

## 2021-02-12 ENCOUNTER — Telehealth: Payer: Self-pay

## 2021-02-12 ENCOUNTER — Other Ambulatory Visit: Payer: Self-pay | Admitting: Internal Medicine

## 2021-02-12 ENCOUNTER — Encounter: Payer: Self-pay | Admitting: Internal Medicine

## 2021-02-12 DIAGNOSIS — Z111 Encounter for screening for respiratory tuberculosis: Secondary | ICD-10-CM

## 2021-02-12 NOTE — Telephone Encounter (Signed)
Order was placed by Dr. Lawerance Bach and faxed to the number below. Confirmation has been received.

## 2021-02-12 NOTE — Telephone Encounter (Signed)
I need clarification on where she is wanting this ordered. We can do at our lab or [facility] easily. We can give her an order to take to a [facility] likely.

## 2021-02-12 NOTE — Telephone Encounter (Signed)
Pt is calling requesting QuantiFERON-TB Gold  order to be faxed to her job. Pt is being required to complete another TB test for school. Pt is asking to complete the blood test instead of the skin test.  Fax number [phone]  Please update pt if this can not be completed.  CB [phone]

## 2021-02-12 NOTE — Telephone Encounter (Signed)
See below

## 2021-02-12 NOTE — Telephone Encounter (Signed)
[facility] is where the pt is wanting the Samantha Lucas lab order sent via fax due her needing this test done for school.  Pt is asking that the order be faxed to [phone].

## 2021-02-15 LAB — QUANTIFERON-TB GOLD PLUS (RQFGPL)
QuantiFERON Mitogen Value: >10 IU/mL
QuantiFERON Nil Value: 0.02 IU/mL
QuantiFERON TB1 Ag Value: 0.03 IU/mL
QuantiFERON TB2 Ag Value: 0.04 IU/mL

## 2021-02-15 LAB — QUANTIFERON-TB GOLD PLUS: QuantiFERON-TB Gold Plus: NEGATIVE

## 2021-02-20 ENCOUNTER — Ambulatory Visit: Payer: No Typology Code available for payment source | Admitting: Internal Medicine

## 2021-02-21 ENCOUNTER — Telehealth: Payer: Self-pay | Admitting: Internal Medicine

## 2021-02-21 ENCOUNTER — Ambulatory Visit: Payer: 59 | Admitting: Internal Medicine

## 2021-02-21 ENCOUNTER — Other Ambulatory Visit: Payer: Self-pay

## 2021-02-21 ENCOUNTER — Encounter: Payer: Self-pay | Admitting: Internal Medicine

## 2021-02-21 DIAGNOSIS — R1011 Right upper quadrant pain: Secondary | ICD-10-CM | POA: Diagnosis not present

## 2021-02-21 MED ORDER — OMEPRAZOLE 40 MG PO CPDR
40.0000 mg | DELAYED_RELEASE_CAPSULE | Freq: Every day | ORAL | 3 refills | Status: DC
  Filled 2021-02-21: qty 30, 30d supply, fill #0

## 2021-02-21 NOTE — Assessment & Plan Note (Signed)
Could be GERD, gallbladder or constipation. Pain is generally after eating certain foods and lasts a few minutes. Not intense and she has not tried anything for pain. We will try 1-2 weeks of omeprazole and work on the constipation. If no improvement will order RUQ [place].

## 2021-02-21 NOTE — Patient Instructions (Signed)
I would have a gynecologist check for PCOS.  We have sent in omeprazole to take 1 pill daily for 1-2 weeks to see if this stops the pain.   If the pain continues let us know and we will check an ultrasound for the gallbladder.

## 2021-02-21 NOTE — Telephone Encounter (Signed)
Patient calling in  Would like for provider to send rx for omeprazole (PRILOSEC) 40 MG capsule to different pharmacy    [id number] - [place], [place] - [address] Phone:  [phone]  Fax:  [phone]

## 2021-02-21 NOTE — Progress Notes (Signed)
° °  Subjective:   Patient ID: Samantha Lucas, female    DOB: April 26, 1990, 31 y.o.   MRN: [id number]  Abdominal Pain Pertinent negatives include no constipation, diarrhea, nausea or vomiting.  The patient is a 31 YO female coming in for RUQ pain.  Review of Systems  Constitutional: Negative.   HENT: Negative.    Eyes: Negative.   Respiratory:  Negative for cough, chest tightness and shortness of breath.   Cardiovascular:  Negative for chest pain, palpitations and leg swelling.  Gastrointestinal:  Positive for abdominal pain. Negative for abdominal distention, constipation, diarrhea, nausea and vomiting.  Musculoskeletal: Negative.   Skin: Negative.   Neurological: Negative.   Psychiatric/Behavioral: Negative.     Objective:  Physical Exam Constitutional:      Appearance: She is well-developed.  HENT:     Head: Normocephalic and atraumatic.  Cardiovascular:     Rate and Rhythm: Normal rate and regular rhythm.  Pulmonary:     Effort: Pulmonary effort is normal. No respiratory distress.     Breath sounds: Normal breath sounds. No wheezing or rales.  Abdominal:     General: Bowel sounds are normal. There is no distension.     Palpations: Abdomen is soft.     Tenderness: There is no abdominal tenderness. There is no rebound.  Musculoskeletal:     Cervical back: Normal range of motion.  Skin:    General: Skin is warm and dry.  Neurological:     Mental Status: She is alert and oriented to person, place, and time.     Coordination: Coordination normal.    Vitals:   02/21/21 1530  BP: 122/68  Pulse: 64  Resp: 18  SpO2: 100%  Weight: 248 lb 12.8 oz (112.9 kg)  Height: 5\' 9"  (1.753 m)    This visit occurred during the SARS-CoV-2 public health emergency.  Safety protocols were in place, including screening questions prior to the visit, additional usage of staff PPE, and extensive cleaning of exam room while observing appropriate contact time as indicated for disinfecting  solutions.   Assessment & Plan:

## 2021-02-24 ENCOUNTER — Other Ambulatory Visit: Payer: Self-pay

## 2021-02-24 MED ORDER — OMEPRAZOLE 40 MG PO CPDR: 40.0000 mg | DELAYED_RELEASE_CAPSULE | Freq: Every day | ORAL | 3 refills | Status: DC

## 2021-02-24 NOTE — Telephone Encounter (Signed)
Refill has been sent to the pt's pharmacy  

## 2021-03-04 ENCOUNTER — Encounter: Payer: Self-pay | Admitting: Internal Medicine

## 2021-03-04 DIAGNOSIS — R1011 Right upper quadrant pain: Secondary | ICD-10-CM

## 2021-03-06 ENCOUNTER — Encounter (HOSPITAL_COMMUNITY): Payer: Self-pay | Admitting: Emergency Medicine

## 2021-03-06 ENCOUNTER — Emergency Department (HOSPITAL_COMMUNITY)
Admission: EM | Admit: 2021-03-06 | Discharge: 2021-03-06 | Disposition: A | Discharge status: Home / Self Care | Payer: 59 | Attending: Emergency Medicine | Admitting: Emergency Medicine

## 2021-03-06 DIAGNOSIS — R519 Headache, unspecified: Secondary | ICD-10-CM | POA: Diagnosis not present

## 2021-03-06 MED ORDER — KETOROLAC TROMETHAMINE 30 MG/ML IJ SOLN
INTRAMUSCULAR | Status: AC
  Filled 2021-03-06: qty 1

## 2021-03-06 NOTE — ED Triage Notes (Signed)
Pt c/o head pain that started tonight while she was in the shower and heard a pop sound at her left temple.

## 2021-03-06 NOTE — ED Notes (Signed)
Toradal given IM in pt right deltoid.

## 2021-03-06 NOTE — ED Provider Notes (Signed)
Patient here during Epic downtime. Notes/orders were recorded manually.   Samantha Lucas, [name], MD 03/06/21 [phone]

## 2021-03-07 ENCOUNTER — Other Ambulatory Visit (INDEPENDENT_AMBULATORY_CARE_PROVIDER_SITE_OTHER): Payer: 59

## 2021-03-07 DIAGNOSIS — R1011 Right upper quadrant pain: Secondary | ICD-10-CM

## 2021-03-07 LAB — CBC
HCT: 34.8 % — ABNORMAL LOW (ref 36.0–46.0)
Hemoglobin: 11 g/dL — ABNORMAL LOW (ref 12.0–15.0)
MCHC: 31.6 g/dL (ref 30.0–36.0)
MCV: 79.3 fl (ref 78.0–100.0)
Platelets: 272 10*3/uL (ref 150.0–400.0)
RBC: 4.39 Mil/uL (ref 3.87–5.11)
RDW: 15.2 % (ref 11.5–15.5)
WBC: 9.3 10*3/uL (ref 4.0–10.5)

## 2021-03-07 LAB — COMPREHENSIVE METABOLIC PANEL
ALT: 8 U/L (ref 0–35)
AST: 7 U/L (ref 0–37)
Albumin: 3.4 g/dL — ABNORMAL LOW (ref 3.5–5.2)
Alkaline Phosphatase: 126 U/L — ABNORMAL HIGH (ref 39–117)
BUN: 11 mg/dL (ref 6–23)
CO2: 29 mEq/L (ref 19–32)
Calcium: 8.8 mg/dL (ref 8.4–10.5)
Chloride: 103 mEq/L (ref 96–112)
Creatinine, Ser: 0.64 mg/dL (ref 0.40–1.20)
GFR: 120.33 mL/min (ref >60.00)
Glucose, Bld: 296 mg/dL — ABNORMAL HIGH (ref 70–99)
Potassium: 3.9 mEq/L (ref 3.5–5.1)
Sodium: 137 mEq/L (ref 135–145)
Total Bilirubin: 0.3 mg/dL (ref 0.2–1.2)
Total Protein: 6.4 g/dL (ref 6.0–8.3)

## 2021-03-07 LAB — LIPASE: Lipase: 29 U/L (ref 11.0–59.0)

## 2021-03-10 ENCOUNTER — Other Ambulatory Visit: Payer: Self-pay | Admitting: Internal Medicine

## 2021-03-10 ENCOUNTER — Other Ambulatory Visit (INDEPENDENT_AMBULATORY_CARE_PROVIDER_SITE_OTHER): Payer: 59

## 2021-03-10 DIAGNOSIS — E1169 Type 2 diabetes mellitus with other specified complication: Secondary | ICD-10-CM

## 2021-03-10 DIAGNOSIS — Z794 Long term (current) use of insulin: Secondary | ICD-10-CM | POA: Diagnosis not present

## 2021-03-10 LAB — HEMOGLOBIN A1C: Hgb A1c MFr Bld: 8.5 % — ABNORMAL HIGH (ref 4.6–6.5)

## 2021-03-14 ENCOUNTER — Telehealth: Payer: 59 | Admitting: Physician Assistant

## 2021-03-14 ENCOUNTER — Other Ambulatory Visit (HOSPITAL_COMMUNITY): Payer: Self-pay

## 2021-03-14 DIAGNOSIS — J019 Acute sinusitis, unspecified: Secondary | ICD-10-CM | POA: Diagnosis not present

## 2021-03-14 DIAGNOSIS — B9689 Other specified bacterial agents as the cause of diseases classified elsewhere: Secondary | ICD-10-CM

## 2021-03-14 MED ORDER — AMOXICILLIN-POT CLAVULANATE 875-125 MG PO TABS: 1.0000 | ORAL_TABLET | Freq: Two times a day (BID) | ORAL | 0 refills | Status: DC

## 2021-03-14 NOTE — Progress Notes (Signed)

## 2021-03-21 ENCOUNTER — Other Ambulatory Visit (HOSPITAL_COMMUNITY): Payer: Self-pay

## 2021-03-21 ENCOUNTER — Other Ambulatory Visit: Payer: Self-pay

## 2021-03-21 DIAGNOSIS — E1169 Type 2 diabetes mellitus with other specified complication: Secondary | ICD-10-CM

## 2021-03-21 DIAGNOSIS — Z794 Long term (current) use of insulin: Secondary | ICD-10-CM

## 2021-03-21 MED ORDER — LISINOPRIL 2.5 MG PO TABS: 2.5000 mg | ORAL_TABLET | Freq: Every day | ORAL | 1 refills | Status: DC

## 2021-04-24 ENCOUNTER — Encounter: Payer: Self-pay | Admitting: Internal Medicine

## 2021-04-25 ENCOUNTER — Other Ambulatory Visit: Payer: Self-pay

## 2021-04-25 DIAGNOSIS — E1169 Type 2 diabetes mellitus with other specified complication: Secondary | ICD-10-CM

## 2021-04-25 MED ORDER — GLIPIZIDE 5 MG PO TABS: ORAL_TABLET | Freq: Two times a day (BID) | ORAL | 3 refills | Status: DC

## 2021-05-28 ENCOUNTER — Other Ambulatory Visit (INDEPENDENT_AMBULATORY_CARE_PROVIDER_SITE_OTHER): Payer: Self-pay | Admitting: Family Medicine

## 2021-05-28 ENCOUNTER — Other Ambulatory Visit: Payer: Self-pay

## 2021-05-28 ENCOUNTER — Encounter (INDEPENDENT_AMBULATORY_CARE_PROVIDER_SITE_OTHER): Payer: Self-pay | Admitting: Family Medicine

## 2021-05-28 DIAGNOSIS — E559 Vitamin D deficiency, unspecified: Secondary | ICD-10-CM

## 2021-05-29 ENCOUNTER — Telehealth: Payer: 59 | Admitting: Physician Assistant

## 2021-05-29 DIAGNOSIS — R3989 Other symptoms and signs involving the genitourinary system: Secondary | ICD-10-CM

## 2021-05-29 MED ORDER — CEPHALEXIN 500 MG PO CAPS: 500.0000 mg | ORAL_CAPSULE | Freq: Two times a day (BID) | ORAL | 0 refills | Status: AC

## 2021-05-29 NOTE — Progress Notes (Signed)
I have spent 5 minutes in review of e-visit questionnaire, review and updating patient chart, medical decision making and response to patient.   Quinetta Shilling [name] Jaiyla Granados, PA-C    

## 2021-05-29 NOTE — Progress Notes (Signed)

## 2021-06-02 ENCOUNTER — Encounter: Payer: Self-pay | Admitting: Internal Medicine

## 2021-06-17 ENCOUNTER — Ambulatory Visit: Payer: 59 | Admitting: Internal Medicine

## 2021-06-20 ENCOUNTER — Ambulatory Visit: Payer: 59 | Admitting: Internal Medicine

## 2021-06-20 ENCOUNTER — Encounter: Payer: Self-pay | Admitting: Internal Medicine

## 2021-06-20 VITALS — BP 114/68 | HR 71 | Resp 18 | Ht 69.0 in | Wt 248.4 lb

## 2021-06-20 DIAGNOSIS — Z0000 Encounter for general adult medical examination without abnormal findings: Secondary | ICD-10-CM | POA: Diagnosis not present

## 2021-06-20 DIAGNOSIS — Z794 Long term (current) use of insulin: Secondary | ICD-10-CM

## 2021-06-20 DIAGNOSIS — E1169 Type 2 diabetes mellitus with other specified complication: Secondary | ICD-10-CM

## 2021-06-20 DIAGNOSIS — E559 Vitamin D deficiency, unspecified: Secondary | ICD-10-CM

## 2021-06-20 DIAGNOSIS — I10 Essential (primary) hypertension: Secondary | ICD-10-CM

## 2021-06-20 DIAGNOSIS — Z1159 Encounter for screening for other viral diseases: Secondary | ICD-10-CM | POA: Diagnosis not present

## 2021-06-20 LAB — MICROALBUMIN / CREATININE URINE RATIO
Creatinine,U: 94.4 mg/dL
Microalb Creat Ratio: 0.7 mg/g (ref 0.0–30.0)
Microalb, Ur: <0.7 mg/dL (ref 0.0–1.9)

## 2021-06-20 LAB — HEMOGLOBIN A1C: Hgb A1c MFr Bld: 11.1 % — ABNORMAL HIGH (ref 4.6–6.5)

## 2021-06-20 LAB — LIPID PANEL
Cholesterol: 174 mg/dL (ref 0–200)
HDL: 35 mg/dL — ABNORMAL LOW (ref >39.00)
LDL Cholesterol: 116 mg/dL — ABNORMAL HIGH (ref 0–99)
NonHDL: 138.64
Total CHOL/HDL Ratio: 5
Triglycerides: 114 mg/dL (ref 0.0–149.0)
VLDL: 22.8 mg/dL (ref 0.0–40.0)

## 2021-06-20 MED ORDER — TIRZEPATIDE 7.5 MG/0.5ML ~~LOC~~ SOAJ: 7.5000 mg | SUBCUTANEOUS | 3 refills | Status: DC

## 2021-06-20 MED ORDER — VITAMIN D (ERGOCALCIFEROL) 1.25 MG (50000 UNIT) PO CAPS: 50000.0000 [IU] | ORAL_CAPSULE | ORAL | 0 refills | Status: DC

## 2021-06-20 NOTE — Assessment & Plan Note (Signed)
Taking lisinopril 2.5 mg daily and working on weight loss to help. BP at goal. Checking CMP and adjust as needed.

## 2021-06-20 NOTE — Assessment & Plan Note (Signed)
Flu shot yearly. Covid-19 counseled. Tetanus up to date. Pap smear up to date. Counseled about sun safety and mole surveillance. Counseled about the dangers of distracted driving. Given 10 year screening recommendations.   

## 2021-06-20 NOTE — Assessment & Plan Note (Signed)
Has been off mounjaro due to lack of prescription and would like to resume. We have prescribed this at 7.5 mg weekly. Follow up in 3 months. Continue glipizide 5 mg BID as well. Foot exam done and reminded about eye exam. Checking HgA1c and lipid panel and microalbumin to creatinine ratio. Is on ACE-I but not on statin currently.

## 2021-06-20 NOTE — Assessment & Plan Note (Signed)
Complicated by diabetes and hypertension. Rx mounjaro 7.5 mg weekly for weight loss to help.

## 2021-06-20 NOTE — Assessment & Plan Note (Signed)
Refilled vitamin D 65784 units weekly as this was helping with energy to work on weight loss.

## 2021-06-20 NOTE — Patient Instructions (Signed)
We have sent in the refills and will check the labs today.

## 2021-06-20 NOTE — Progress Notes (Signed)
   Subjective:   Patient ID: Samantha Lucas, female    DOB: 1990/11/18, 31 y.o.   MRN: [id number]  HPI The patient is here for physical.  PMH, [facility], social history reviewed and updated  Review of Systems  Constitutional: Negative.   HENT: Negative.    Eyes: Negative.   Respiratory:  Negative for cough, chest tightness and shortness of breath.   Cardiovascular:  Negative for chest pain, palpitations and leg swelling.  Gastrointestinal:  Negative for abdominal distention, abdominal pain, constipation, diarrhea, nausea and vomiting.  Musculoskeletal: Negative.   Skin: Negative.   Neurological: Negative.   Psychiatric/Behavioral: Negative.     Objective:  Physical Exam Constitutional:      Appearance: She is well-developed. She is obese.  HENT:     Head: Normocephalic and atraumatic.  Cardiovascular:     Rate and Rhythm: Normal rate and regular rhythm.  Pulmonary:     Effort: Pulmonary effort is normal. No respiratory distress.     Breath sounds: Normal breath sounds. No wheezing or rales.  Abdominal:     General: Bowel sounds are normal. There is no distension.     Palpations: Abdomen is soft.     Tenderness: There is no abdominal tenderness. There is no rebound.  Musculoskeletal:     Cervical back: Normal range of motion.  Skin:    General: Skin is warm and dry.     Comments: Foot exam done  Neurological:     Mental Status: She is alert and oriented to person, place, and time.     Coordination: Coordination normal.    Vitals:   06/20/21 1102  BP: 114/68  Pulse: 71  Resp: 18  SpO2: 98%  Weight: 248 lb 6.4 oz (112.7 kg)  Height: 5\' 9"  (1.753 m)   This visit occurred during the SARS-CoV-2 public health emergency.  Safety protocols were in place, including screening questions prior to the visit, additional usage of staff PPE, and extensive cleaning of exam room while observing appropriate contact time as indicated for disinfecting solutions.   Assessment & Plan:

## 2021-06-24 LAB — HEPATITIS C ANTIBODY
Hepatitis C Ab: NONREACTIVE
SIGNAL TO CUT-OFF: 0.12 (ref <1.00)

## 2021-06-24 MED ORDER — OZEMPIC (0.25 OR 0.5 MG/DOSE) 2 MG/3ML ~~LOC~~ SOPN: PEN_INJECTOR | SUBCUTANEOUS | 0 refills | Status: AC

## 2021-06-27 ENCOUNTER — Telehealth: Payer: Self-pay

## 2021-06-27 NOTE — Telephone Encounter (Signed)
Prior Authorization has been initiated on covermymeds for Ozempic Key: [id number] Rx #: [id number]  Awaiting determination from insurance

## 2021-07-08 NOTE — Telephone Encounter (Signed)
Rec'd questionnaire for pt Ozempic. Completed and fax back to [employer]. Rec'd fax stating med has been "APPROVED": covering dates 06/27/21 thru 06/28/22. Faxing approval to pof...Johny Chess

## 2021-07-23 ENCOUNTER — Other Ambulatory Visit (HOSPITAL_COMMUNITY): Payer: Self-pay

## 2021-09-03 ENCOUNTER — Encounter (INDEPENDENT_AMBULATORY_CARE_PROVIDER_SITE_OTHER): Payer: Self-pay

## 2021-09-19 ENCOUNTER — Encounter: Payer: Self-pay | Admitting: Internal Medicine

## 2021-09-19 ENCOUNTER — Ambulatory Visit (INDEPENDENT_AMBULATORY_CARE_PROVIDER_SITE_OTHER): Payer: 59 | Admitting: Internal Medicine

## 2021-09-19 VITALS — BP 102/70 | HR 70 | Temp 98.7°F | Ht 69.0 in | Wt 251.0 lb

## 2021-09-19 DIAGNOSIS — E1169 Type 2 diabetes mellitus with other specified complication: Secondary | ICD-10-CM

## 2021-09-19 DIAGNOSIS — Z794 Long term (current) use of insulin: Secondary | ICD-10-CM

## 2021-09-19 LAB — POCT GLYCOSYLATED HEMOGLOBIN (HGB A1C): Hemoglobin A1C: 11.6 % — AB (ref 4.0–5.6)

## 2021-09-19 MED ORDER — TRULICITY 1.5 MG/0.5ML ~~LOC~~ SOAJ: 1.5000 mg | SUBCUTANEOUS | 0 refills | Status: DC

## 2021-09-19 MED ORDER — TRULICITY 3 MG/0.5ML ~~LOC~~ SOAJ: 3.0000 mg | SUBCUTANEOUS | 3 refills | Status: DC

## 2021-09-19 MED ORDER — TRULICITY 0.75 MG/0.5ML ~~LOC~~ SOAJ: 0.7500 mg | SUBCUTANEOUS | 0 refills | Status: DC

## 2021-09-19 MED ORDER — GLIPIZIDE 10 MG PO TABS: 10.0000 mg | ORAL_TABLET | Freq: Two times a day (BID) | ORAL | 3 refills | Status: DC

## 2021-09-19 NOTE — Progress Notes (Signed)
   Subjective:   Patient ID: Samantha Lucas, female    DOB: 11/16/1990, 31 y.o.   MRN: [id number]  HPI The patient is a 31 YO female coming in for follow up diabetes.   Review of Systems  Constitutional: Negative.   HENT: Negative.    Eyes: Negative.   Respiratory:  Negative for cough, chest tightness and shortness of breath.   Cardiovascular:  Negative for chest pain, palpitations and leg swelling.  Gastrointestinal:  Negative for abdominal distention, abdominal pain, constipation, diarrhea, nausea and vomiting.  Musculoskeletal: Negative.   Skin: Negative.   Neurological: Negative.   Psychiatric/Behavioral: Negative.      Objective:  Physical Exam Constitutional:      Appearance: She is well-developed.  HENT:     Head: Normocephalic and atraumatic.  Cardiovascular:     Rate and Rhythm: Normal rate and regular rhythm.  Pulmonary:     Effort: Pulmonary effort is normal. No respiratory distress.     Breath sounds: Normal breath sounds. No wheezing or rales.  Abdominal:     General: Bowel sounds are normal. There is no distension.     Palpations: Abdomen is soft.     Tenderness: There is no abdominal tenderness. There is no rebound.  Musculoskeletal:     Cervical back: Normal range of motion.  Skin:    General: Skin is warm and dry.  Neurological:     Mental Status: She is alert and oriented to person, place, and time.     Coordination: Coordination normal.     Vitals:   09/19/21 1033  BP: 102/70  Pulse: 70  Temp: 98.7 F (37.1 C)  TempSrc: Oral  SpO2: 98%  Weight: 251 lb (113.9 kg)  Height: 5\' 9"  (1.753 m)    Assessment & Plan:

## 2021-09-19 NOTE — Patient Instructions (Signed)
I have sent in trulicity to start at 0.75 mg weekly for first month. Then increase to 1.5 mg weekly for 1 month. Then increase to 3 mg weekly and stay there.  We have also increased the glipizide to 10 mg twice a day. If you have some left you can do 2 pills twice a day until you run out.

## 2021-09-19 NOTE — Assessment & Plan Note (Signed)
Has made some dietary changes. Was unable to get ozempic due to needing PA, then it was unavailable at pharmacy. She has taken trulicity in the past. Will resume with 0.75 mg weekly for 1 month, then 1.5 mg weekly for 1 month, then 3 mg weekly then return for follow up. We will also increase glipizide to 10 mg BID. POC HgA1c done today at 11.6 which is severe exacerbation. She has been trying to make dietary changes and given counseling on diet and encouraged to continue today.

## 2021-09-22 ENCOUNTER — Other Ambulatory Visit: Payer: Self-pay | Admitting: Internal Medicine

## 2021-09-22 DIAGNOSIS — E559 Vitamin D deficiency, unspecified: Secondary | ICD-10-CM

## 2021-09-23 ENCOUNTER — Other Ambulatory Visit: Payer: Self-pay | Admitting: Internal Medicine

## 2021-09-23 DIAGNOSIS — E1169 Type 2 diabetes mellitus with other specified complication: Secondary | ICD-10-CM

## 2021-09-23 DIAGNOSIS — Z794 Long term (current) use of insulin: Secondary | ICD-10-CM

## 2021-10-03 ENCOUNTER — Telehealth: Payer: Self-pay

## 2021-10-03 NOTE — Telephone Encounter (Signed)
Pt PA for Trulicity started and send  Key: [id number]

## 2021-10-06 NOTE — Telephone Encounter (Signed)
Pt Trulicity approved 10/03/21

## 2021-10-09 ENCOUNTER — Other Ambulatory Visit: Payer: Self-pay | Admitting: Internal Medicine

## 2021-10-09 ENCOUNTER — Encounter: Payer: Self-pay | Admitting: Internal Medicine

## 2021-10-09 DIAGNOSIS — E559 Vitamin D deficiency, unspecified: Secondary | ICD-10-CM

## 2021-10-10 MED ORDER — VITAMIN D (ERGOCALCIFEROL) 1.25 MG (50000 UNIT) PO CAPS: 50000.0000 [IU] | ORAL_CAPSULE | ORAL | 0 refills | Status: DC

## 2021-10-30 NOTE — Telephone Encounter (Signed)
Called pt, no response voicemail wasn't set up but I also responded back to pt through MyChart.

## 2021-12-11 ENCOUNTER — Ambulatory Visit: Payer: 59 | Admitting: Internal Medicine

## 2021-12-22 ENCOUNTER — Ambulatory Visit: Payer: 59 | Admitting: Internal Medicine

## 2021-12-23 ENCOUNTER — Ambulatory Visit: Payer: 59 | Admitting: Internal Medicine

## 2021-12-29 ENCOUNTER — Telehealth: Payer: Self-pay | Admitting: Nurse Practitioner

## 2021-12-29 DIAGNOSIS — R051 Acute cough: Secondary | ICD-10-CM

## 2021-12-29 MED ORDER — BENZONATATE 100 MG PO CAPS: 100.0000 mg | ORAL_CAPSULE | Freq: Three times a day (TID) | ORAL | 0 refills | Status: DC | PRN

## 2021-12-29 MED ORDER — PREDNISONE 10 MG PO TABS
10.0000 mg | ORAL_TABLET | Freq: Two times a day (BID) | ORAL | 0 refills | Status: AC
Start: 2021-12-29 — End: 2022-01-03

## 2021-12-29 MED ORDER — ALBUTEROL SULFATE HFA 108 (90 BASE) MCG/ACT IN AERS: 2.0000 | INHALATION_SPRAY | Freq: Four times a day (QID) | RESPIRATORY_TRACT | 0 refills | Status: DC | PRN

## 2021-12-29 NOTE — Progress Notes (Signed)
We are sorry that you are not feeling well.  Here is how we plan to help!  Based on your presentation I believe you most likely have A cough due to a virus.  This is called viral bronchitis and is best treated by rest, plenty of fluids and control of the cough.  You may use Ibuprofen or Tylenol as directed to help your symptoms.     In addition you may use A prescription cough medication called Tessalon Perles 100mg . You may take 1-2 capsules every 8 hours as needed for your cough.  Prednisone 10 mg twice daily for 5 days  We will also re order your Albuterol inhaler which you should be using as needed every 4-6 hours to help as well.     From your responses in the eVisit questionnaire you describe inflammation in the upper respiratory tract which is causing a significant cough.  This is commonly called Bronchitis and has four common causes:   Allergies Viral Infections Acid Reflux Bacterial Infection Allergies, viruses and acid reflux are treated by controlling symptoms or eliminating the cause. An example might be a cough caused by taking certain blood pressure medications. You stop the cough by changing the medication. Another example might be a cough caused by acid reflux. Controlling the reflux helps control the cough.  USE OF BRONCHODILATOR ("RESCUE") INHALERS: There is a risk from using your bronchodilator too frequently.  The risk is that over-reliance on a medication which only relaxes the muscles surrounding the breathing tubes can reduce the effectiveness of medications prescribed to reduce swelling and congestion of the tubes themselves.  Although you feel brief relief from the bronchodilator inhaler, your asthma may actually be worsening with the tubes becoming more swollen and filled with mucus.  This can delay other crucial treatments, such as oral steroid medications. If you need to use a bronchodilator inhaler daily, several times per day, you should discuss this with your  provider.  There are probably better treatments that could be used to keep your asthma under control.     HOME CARE Only take medications as instructed by your medical team. Complete the entire course of an antibiotic. Drink plenty of fluids and get plenty of rest. Avoid close contacts especially the very young and the elderly Cover your mouth if you cough or cough into your sleeve. Always remember to wash your hands A steam or ultrasonic humidifier can help congestion.   GET HELP RIGHT AWAY IF: You develop worsening fever. You become short of breath You cough up blood. Your symptoms persist after you have completed your treatment plan MAKE SURE YOU  Understand these instructions. Will watch your condition. Will get help right away if you are not doing well or get worse.    Thank you for choosing an e-visit.  Your e-visit answers were reviewed by a board certified advanced clinical practitioner to complete your personal care plan. Depending upon the condition, your plan could have included both over the counter or prescription medications.  Please review your pharmacy choice. Make sure the pharmacy is open so you can pick up prescription now. If there is a problem, you may contact your provider through and have the prescription routed to another pharmacy.  Your safety is important to [employer]. If you have drug allergies check your prescription carefully.   For the next 24 hours you can use MyChart to ask questions about today's visit, request a non-urgent call back, or ask for a work or  school excuse. You will get an email in the next two days asking about your experience. I hope that your e-visit has been valuable and will speed your recovery.  Meds ordered this encounter  Medications   benzonatate (TESSALON) 100 MG capsule    Sig: Take 1 capsule (100 mg total) by mouth 3 (three) times daily as needed.    Dispense:  30 capsule    Refill:  0   predniSONE (DELTASONE) 10  MG tablet    Sig: Take 1 tablet (10 mg total) by mouth 2 (two) times daily with a meal for 5 days.    Dispense:  10 tablet    Refill:  0   albuterol (VENTOLIN HFA) 108 (90 Base) MCG/ACT inhaler    Sig: Inhale 2 puffs into the lungs every 6 (six) hours as needed for wheezing or shortness of breath.    Dispense:  8 g    Refill:  0    I spent approximately 5 minutes reviewing the patient's history, current symptoms and coordinating their plan of care today.

## 2022-02-13 ENCOUNTER — Ambulatory Visit: Payer: 59 | Admitting: Internal Medicine

## 2022-03-02 ENCOUNTER — Ambulatory Visit: Payer: 59 | Admitting: Internal Medicine

## 2022-03-27 ENCOUNTER — Other Ambulatory Visit: Payer: Self-pay | Admitting: Internal Medicine

## 2022-03-27 DIAGNOSIS — E559 Vitamin D deficiency, unspecified: Secondary | ICD-10-CM

## 2022-04-04 ENCOUNTER — Other Ambulatory Visit: Payer: Self-pay | Admitting: Internal Medicine

## 2022-04-04 DIAGNOSIS — E1169 Type 2 diabetes mellitus with other specified complication: Secondary | ICD-10-CM

## 2022-04-06 ENCOUNTER — Encounter: Payer: Self-pay | Admitting: Internal Medicine

## 2022-04-06 ENCOUNTER — Ambulatory Visit (INDEPENDENT_AMBULATORY_CARE_PROVIDER_SITE_OTHER): Payer: Self-pay | Admitting: Internal Medicine

## 2022-04-06 VITALS — BP 122/76 | HR 80 | Temp 98.5°F | Ht 69.0 in | Wt 246.0 lb

## 2022-04-06 DIAGNOSIS — Z794 Long term (current) use of insulin: Secondary | ICD-10-CM

## 2022-04-06 DIAGNOSIS — E559 Vitamin D deficiency, unspecified: Secondary | ICD-10-CM

## 2022-04-06 DIAGNOSIS — E1169 Type 2 diabetes mellitus with other specified complication: Secondary | ICD-10-CM

## 2022-04-06 LAB — POCT GLYCOSYLATED HEMOGLOBIN (HGB A1C): Hemoglobin A1C: 10.6 % — AB (ref 4.0–5.6)

## 2022-04-06 MED ORDER — VITAMIN D (ERGOCALCIFEROL) 1.25 MG (50000 UNIT) PO CAPS: 50000.0000 [IU] | ORAL_CAPSULE | ORAL | 0 refills | Status: DC

## 2022-04-06 MED ORDER — TRULICITY 0.75 MG/0.5ML ~~LOC~~ SOAJ: 0.7500 mg | SUBCUTANEOUS | 0 refills | Status: DC

## 2022-04-06 MED ORDER — TRULICITY 1.5 MG/0.5ML ~~LOC~~ SOAJ: 1.5000 mg | SUBCUTANEOUS | 0 refills | Status: DC

## 2022-04-06 MED ORDER — TRULICITY 3 MG/0.5ML ~~LOC~~ SOAJ: 3.0000 mg | SUBCUTANEOUS | 3 refills | Status: DC

## 2022-04-06 NOTE — Progress Notes (Signed)
   Subjective:   Patient ID: Samantha Lucas, female    DOB: Aug 26, 1990, 32 y.o.   MRN: [id number]  HPI The patient is a 32 YO coming in for follow up.   Review of Systems  Constitutional: Negative.   HENT: Negative.    Eyes: Negative.   Respiratory:  Negative for cough, chest tightness and shortness of breath.   Cardiovascular:  Negative for chest pain, palpitations and leg swelling.  Gastrointestinal:  Negative for abdominal distention, abdominal pain, constipation, diarrhea, nausea and vomiting.  Musculoskeletal: Negative.   Skin: Negative.   Neurological: Negative.   Psychiatric/Behavioral: Negative.      Objective:  Physical Exam Constitutional:      Appearance: She is well-developed.  HENT:     Head: Normocephalic and atraumatic.  Cardiovascular:     Rate and Rhythm: Normal rate and regular rhythm.  Pulmonary:     Effort: Pulmonary effort is normal. No respiratory distress.     Breath sounds: Normal breath sounds. No wheezing or rales.  Abdominal:     General: Bowel sounds are normal. There is no distension.     Palpations: Abdomen is soft.     Tenderness: There is no abdominal tenderness. There is no rebound.  Musculoskeletal:     Cervical back: Normal range of motion.  Skin:    General: Skin is warm and dry.  Neurological:     Mental Status: She is alert and oriented to person, place, and time.     Coordination: Coordination normal.     Vitals:   04/06/22 1513  BP: 122/76  Pulse: 80  Temp: 98.5 F (36.9 C)  TempSrc: Oral  SpO2: 99%  Weight: 246 lb (111.6 kg)  Height: 5\' 9"  (1.753 m)    Assessment & Plan:  Visit time 25 minutes in face to face communication with patient and coordination of care, additional 5 minutes spent in record review, coordination or care, ordering tests, communicating/referring to other healthcare professionals, documenting in medical records all on the same day of the visit for total time 30 minutes spent on the visit.

## 2022-04-06 NOTE — Patient Instructions (Signed)
We have sent in the trulicity.

## 2022-04-08 ENCOUNTER — Encounter: Payer: Self-pay | Admitting: Internal Medicine

## 2022-04-10 ENCOUNTER — Other Ambulatory Visit (HOSPITAL_COMMUNITY): Payer: Self-pay

## 2022-04-10 ENCOUNTER — Encounter: Payer: Self-pay | Admitting: Internal Medicine

## 2022-04-10 ENCOUNTER — Telehealth: Payer: Self-pay

## 2022-04-10 NOTE — Telephone Encounter (Signed)
Pharmacy Patient Advocate Encounter   Received notification that prior authorization for Trulicity 0.75MG /0.5ML pen-injectors is required/requested.  Per Test Claim: PA required   PA submitted on 04/10/22 to (ins) [employer] via [employer] or [facility]) confirmation # [id number] Status is pending

## 2022-04-10 NOTE — Assessment & Plan Note (Signed)
BMI 36 and complicated by diabetes and vitamin d deficiency and anemia. Rx trulicity today to help with weight loss as she has struggled and lack of success with lifestyle changes.

## 2022-04-10 NOTE — Assessment & Plan Note (Signed)
She is willing to try trulicity and did not start for some reason after our last visit. POC HgA1c done and still poorly controlled with moderate exacerbation. She is taking glipizide 10 mg BID. Rx trulicity standard titration to 3 mg weekly and return in 3 months for close follow up. Counseled about potential harm with elevated HgA1c with long term permanent complications.

## 2022-04-13 ENCOUNTER — Other Ambulatory Visit (HOSPITAL_COMMUNITY): Payer: Self-pay

## 2022-04-13 NOTE — Telephone Encounter (Signed)
Received notice that PA was denied, this was only due to supporting labs not being included, I have faxed in the lab notes for reconsideration.

## 2022-04-13 NOTE — Telephone Encounter (Signed)
Patient Advocate Encounter  Prior Authorization for Trulicity 0.75MG /0.5ML pen-injectors  has been approved.    Effective dates: 04/13/22 through 04/13/23

## 2022-04-23 ENCOUNTER — Telehealth: Payer: Self-pay | Admitting: Internal Medicine

## 2022-04-23 ENCOUNTER — Encounter: Payer: Self-pay | Admitting: Internal Medicine

## 2022-04-23 ENCOUNTER — Emergency Department
Admission: EM | Admit: 2022-04-23 | Discharge: 2022-04-23 | Disposition: A | Discharge status: Home / Self Care | Payer: 59 | Attending: Emergency Medicine | Admitting: Emergency Medicine

## 2022-04-23 ENCOUNTER — Telehealth: Payer: Self-pay | Admitting: Physician Assistant

## 2022-04-23 ENCOUNTER — Other Ambulatory Visit: Payer: Self-pay

## 2022-04-23 DIAGNOSIS — R739 Hyperglycemia, unspecified: Secondary | ICD-10-CM

## 2022-04-23 DIAGNOSIS — E1165 Type 2 diabetes mellitus with hyperglycemia: Secondary | ICD-10-CM

## 2022-04-23 LAB — CBC
HCT: 40.2 % (ref 36.0–46.0)
Hemoglobin: 12.2 g/dL (ref 12.0–15.0)
MCH: 24.5 pg — ABNORMAL LOW (ref 26.0–34.0)
MCHC: 30.3 g/dL (ref 30.0–36.0)
MCV: 80.7 fL (ref 80.0–100.0)
Platelets: 316 10*3/uL (ref 150–400)
RBC: 4.98 MIL/uL (ref 3.87–5.11)
RDW: 14.6 % (ref 11.5–15.5)
WBC: 8.5 10*3/uL (ref 4.0–10.5)
nRBC: 0 % (ref 0.0–0.2)

## 2022-04-23 LAB — BASIC METABOLIC PANEL
Anion gap: 9 (ref 5–15)
BUN: 14 mg/dL (ref 6–20)
CO2: 25 mmol/L (ref 22–32)
Calcium: 9 mg/dL (ref 8.9–10.3)
Chloride: 97 mmol/L — ABNORMAL LOW (ref 98–111)
Creatinine, Ser: 0.65 mg/dL (ref 0.44–1.00)
GFR, Estimated: >60 mL/min (ref >60)
Glucose, Bld: 354 mg/dL — ABNORMAL HIGH (ref 70–99)
Potassium: 4.4 mmol/L (ref 3.5–5.1)
Sodium: 131 mmol/L — ABNORMAL LOW (ref 135–145)

## 2022-04-23 LAB — CBG MONITORING, ED
Glucose-Capillary: 337 mg/dL — ABNORMAL HIGH (ref 70–99)
Glucose-Capillary: 205 mg/dL — ABNORMAL HIGH (ref 70–99)

## 2022-04-23 MED ORDER — METFORMIN HCL 500 MG PO TABS: 500.0000 mg | ORAL_TABLET | Freq: Every day | ORAL | 0 refills | Status: DC

## 2022-04-23 MED ORDER — SODIUM CHLORIDE 0.9 % IV BOLUS (SEPSIS)
500.0000 mL | Freq: Once | INTRAVENOUS | Status: AC
  Administered 2022-04-23: 500 mL via INTRAVENOUS

## 2022-04-23 MED ORDER — SODIUM CHLORIDE 0.9 % IV BOLUS
1000.0000 mL | Freq: Once | INTRAVENOUS | Status: AC
  Administered 2022-04-23: 1000 mL via INTRAVENOUS

## 2022-04-23 MED ORDER — METFORMIN HCL 500 MG PO TABS
500.0000 mg | ORAL_TABLET | Freq: Once | ORAL | Status: AC
  Administered 2022-04-23: 500 mg via ORAL
  Filled 2022-04-23: qty 1

## 2022-04-23 NOTE — Discharge Instructions (Addendum)
Start 500 mg of metformin once daily.

## 2022-04-23 NOTE — Telephone Encounter (Signed)
Pt would like to transfer to dr banks  

## 2022-04-23 NOTE — ED Provider Notes (Signed)
[facility] Provider Note  Patient Contact: 3:56 PM (approximate)   History   Hyperglycemia   HPI  Samantha Lucas is a 32 y.o. female with a history of diabetes, presents to the emergency department with blood glucose level that was evaluated at work it was in the 400s.  Patient states that she has had a viral illness over the past 4 to 5 days.  No increased work of breathing, headache, vomiting.  She is not currently on a steroid even though she has recently been prescribed prednisone by a virtual urgent care.  No chest pain, chest tightness or shortness of breath.      Physical Exam   Triage Vital Signs: ED Triage Vitals  Enc Vitals Group     BP 04/23/22 1445 (!) 139/91     Pulse Rate 04/23/22 1445 75     Resp 04/23/22 1445 16     Temp 04/23/22 1445 98.2 F (36.8 C)     Temp src --      SpO2 04/23/22 1445 100 %     Weight 04/23/22 1447 244 lb (110.7 kg)     Height 04/23/22 1447 5\' 9"  (1.753 m)     Head Circumference --      Peak Flow --      Pain Score 04/23/22 1446 0     Pain Loc --      Pain Edu? --      Excl. in [place]? --     Most recent vital signs: Vitals:   04/23/22 1445  BP: (!) 139/91  Pulse: 75  Resp: 16  Temp: 98.2 F (36.8 C)  SpO2: 100%     General: Alert and in no acute distress. Eyes:  PERRL. EOMI. Head: No acute traumatic findings ENT:      Nose: No congestion/rhinnorhea.      Mouth/Throat: Mucous membranes are moist. Neck: No stridor. No cervical spine tenderness to palpation. Cardiovascular:  Good peripheral perfusion Respiratory: Normal respiratory effort without tachypnea or retractions. Lungs CTAB. Good air entry to the bases with no decreased or absent breath sounds. Gastrointestinal: Bowel sounds 4 quadrants. Soft and nontender to palpation. No guarding or rigidity. No palpable masses. No distention. No CVA tenderness. Musculoskeletal: Full range of motion to all extremities.  Neurologic:  No gross focal  neurologic deficits are appreciated.  Skin:   No rash noted    ED Results / Procedures / Treatments   Labs (all labs ordered are listed, but only abnormal results are displayed) Labs Reviewed  CBC - Abnormal; Notable for the following components:      Result Value   MCH 24.5 (*)    All other components within normal limits  BASIC METABOLIC PANEL - Abnormal; Notable for the following components:   Sodium 131 (*)    Chloride 97 (*)    Glucose, Bld 354 (*)    All other components within normal limits  CBG MONITORING, ED - Abnormal; Notable for the following components:   Glucose-Capillary 337 (*)    All other components within normal limits  CBG MONITORING, ED - Abnormal; Notable for the following components:   Glucose-Capillary 205 (*)    All other components within normal limits      PROCEDURES:  Critical Care performed: No  Procedures   MEDICATIONS ORDERED IN ED: Medications  sodium chloride 0.9 % bolus 500 mL (0 mLs Intravenous Stopped 04/23/22 1612)  sodium chloride 0.9 % bolus 1,000 mL (0 mLs Intravenous Stopped  04/23/22 1732)  metFORMIN (GLUCOPHAGE) tablet 500 mg (500 mg Oral Given 04/23/22 1617)     IMPRESSION / MDM / ASSESSMENT AND PLAN / ED COURSE  I reviewed the triage vital signs and the nursing notes.                              Assessment and plan: Hyperglycemia:   32 year old female presents to the emergency department with elevated blood glucose levels since recent viral URI.  Vital signs are reassuring at triage.  On exam, patient was alert, active and nontoxic-appearing.  Patient's initial blood glucose was 354.  Normal white blood cell count on CBC.  Patient was given a liter of normal saline and 500 mg of metformin.  Patient states that she did have abdominal cramping and diarrhea with the 1000 mg of metformin previously.  Her glucose levels trended down to 205 and patient stated that she overall felt improved.  Patient did tell me that she was  recently prescribed prednisone by a telehealth provider and I told her to not start medication.  I did start her on 500 mg of metformin once daily as she tolerated this medication very well in the emergency department.  Patient was told to inform her primary care provider about medication change.  Return precautions were given to return with new or worsening symptoms.   FINAL CLINICAL IMPRESSION(S) / ED DIAGNOSES   Final diagnoses:  Hyperglycemia     Rx / DC Orders   ED Discharge Orders          Ordered    metFORMIN (GLUCOPHAGE) 500 MG tablet  Daily with breakfast        04/23/22 1820             Note:  This document was prepared using Dragon voice recognition software and may include unintentional dictation errors.   Vallarie Mare [place], PA-C 04/23/22 [place]    Duffy Bruce, MD 04/23/22 [id number]

## 2022-04-23 NOTE — ED Notes (Signed)
Fsbs 205

## 2022-04-23 NOTE — Progress Notes (Addendum)
Because of how elevated blood glucose , I feel your condition warrants further evaluation and I recommend that you be seen in a face to face visit as you may need additional medications or IV fluids. Please be seen in person ASAP at nearest ER or UC.    NOTE: There will be NO CHARGE for this eVisit   If you are having a true medical emergency please call 911.      For an urgent face to face visit, [place] has eight urgent care centers for your convenience:   NEW!! [facility] [place] [facility] [place] Get Driving Directions [id number] [address] [place], [place] [facility] [place] at [place] Get Driving Directions [id number] [place], [place] [address]   [place] [facility] [place] [facility]) Get Driving Directions [id number] [address], [place] [address]  [place] [facility] [place] ([place]) Get Driving Directions [id number] [address] [place],  [place]  [address]  [place] [facility] [place] [facility] - at [address] [place] Get Driving Directions  [phone] [address] [place],  [place] [address]   [facility] Get Driving Directions [id number] [place], [place], [place] [address]   [facility] Get Driving Directions  [id number] [address] [place], [place] [address]   [facility] [place] Get Driving Directions [id number] [address], [place], [place] [address]  Your MyChart E-visit questionnaire answers were reviewed by a board certified advanced clinical practitioner to complete your personal care plan based on your specific symptoms.  Thank you for using e-Visits.

## 2022-04-23 NOTE — ED Triage Notes (Signed)
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 Reports also possibly has URI NAD noted

## 2022-04-23 NOTE — Telephone Encounter (Signed)
Fine with me

## 2022-05-08 NOTE — Telephone Encounter (Signed)
Ok

## 2022-05-15 NOTE — Telephone Encounter (Signed)
Lmom for pt to call back. 

## 2022-05-17 ENCOUNTER — Other Ambulatory Visit: Payer: Self-pay | Admitting: Internal Medicine

## 2022-05-21 ENCOUNTER — Telehealth: Payer: 59 | Admitting: Physician Assistant

## 2022-05-21 ENCOUNTER — Encounter: Payer: Self-pay | Admitting: Internal Medicine

## 2022-05-21 DIAGNOSIS — R3989 Other symptoms and signs involving the genitourinary system: Secondary | ICD-10-CM

## 2022-05-21 MED ORDER — CEPHALEXIN 500 MG PO CAPS: 500.0000 mg | ORAL_CAPSULE | Freq: Two times a day (BID) | ORAL | 0 refills | Status: AC

## 2022-05-21 NOTE — Progress Notes (Signed)

## 2022-05-21 NOTE — Progress Notes (Signed)
I have spent 5 minutes in review of e-visit questionnaire, review and updating patient chart, medical decision making and response to patient.   Barnes Florek [name] Theador Jezewski, PA-C    

## 2022-05-22 MED ORDER — NITROFURANTOIN MONOHYD MACRO 100 MG PO CAPS: 100.0000 mg | ORAL_CAPSULE | Freq: Two times a day (BID) | ORAL | 0 refills | Status: DC

## 2022-05-22 NOTE — Telephone Encounter (Signed)
Pt has been sch for nov 2024

## 2022-06-15 ENCOUNTER — Encounter: Payer: Self-pay | Admitting: Internal Medicine

## 2022-06-15 ENCOUNTER — Other Ambulatory Visit (HOSPITAL_COMMUNITY)
Admission: RE | Admit: 2022-06-15 | Discharge: 2022-06-15 | Disposition: A | Discharge status: Home / Self Care | Payer: 59 | Source: Ambulatory Visit | Attending: Obstetrics and Gynecology | Admitting: Obstetrics and Gynecology

## 2022-06-15 ENCOUNTER — Ambulatory Visit (INDEPENDENT_AMBULATORY_CARE_PROVIDER_SITE_OTHER): Payer: 59 | Admitting: Obstetrics and Gynecology

## 2022-06-15 ENCOUNTER — Encounter: Payer: Self-pay | Admitting: Obstetrics and Gynecology

## 2022-06-15 VITALS — BP 134/77 | HR 88 | Ht 70.0 in | Wt 259.0 lb

## 2022-06-15 DIAGNOSIS — Z1339 Encounter for screening examination for other mental health and behavioral disorders: Secondary | ICD-10-CM

## 2022-06-15 DIAGNOSIS — E559 Vitamin D deficiency, unspecified: Secondary | ICD-10-CM

## 2022-06-15 DIAGNOSIS — Z01419 Encounter for gynecological examination (general) (routine) without abnormal findings: Secondary | ICD-10-CM | POA: Insufficient documentation

## 2022-06-15 MED ORDER — NORETHIN ACE-ETH ESTRAD-FE 1-20 MG-MCG(24) PO TABS: 1.0000 | ORAL_TABLET | Freq: Every day | ORAL | 11 refills | Status: DC

## 2022-06-15 MED ORDER — SPIRONOLACTONE 50 MG PO TABS: 50.0000 mg | ORAL_TABLET | Freq: Two times a day (BID) | ORAL | 5 refills | Status: DC

## 2022-06-15 NOTE — Progress Notes (Signed)
Subjective:     Samantha Lucas is a 32 y.o. female P0 with LMP 06/04/22 and BMI 37 here for a routine exam.  Current complaints: facial hair.  Patient reports a monthly period lasting 4-5 days. She is sexually active without complaints. She denies pelvic pain or abnormal discharge. She is not using contraception and plans a pregnancy next year. She reports facial hair for the past several years which she shaves. She is without any other complaints   Gynecologic History No LMP recorded. Contraception: none Last Pap: 2021. Results were: normal   Obstetric History OB History  Gravida Para Term Preterm AB Living  0 0 0 0 0 0  SAB IAB Ectopic Multiple Live Births  0 0 0 0 0   Past Medical History:  Diagnosis Date   Back pain    Diabetes mellitus without complication [facility]) May 2014   Scoliosis (and kyphoscoliosis), idiopathic    Viral cardiomyopathy (HCC) 2016   Past Surgical History:  Procedure Laterality Date   WISDOM TOOTH EXTRACTION  dec 2012   Family History  Problem Relation Age of Onset   Hypertension Mother    Cancer Mother    Obesity Mother    Stroke Father    Hypertension Father    Diabetes Father    Cancer Paternal Grandmother        lung and breast cancer   Hypertension Paternal Grandmother    Social History   Tobacco Use   Smoking status: Never   Smokeless tobacco: Never  Vaping Use   Vaping Use: Never used  Substance Use Topics   Alcohol use: Yes    Comment: RARELY   Drug use: No       Review of Systems Pertinent items noted in HPI and remainder of comprehensive ROS otherwise negative.    Objective:  Blood pressure 134/77, pulse 88, height 5\' 10"  (1.778 m), weight 259 lb (117.5 kg), last menstrual period 06/04/2022.   GENERAL: Well-developed, well-nourished female in no acute distress.  HEENT: Normocephalic, atraumatic. Sclerae anicteric.  NECK: Supple. Normal thyroid.  LUNGS: Clear to auscultation bilaterally.  HEART: Regular rate and  rhythm. BREASTS: Symmetric in size. No palpable masses or lymphadenopathy, skin changes, or nipple drainage. ABDOMEN: Soft, nontender, nondistended. No organomegaly. PELVIC: Normal external female genitalia. Vagina is pink and rugated.  Normal discharge. Normal appearing cervix. Uterus is normal in size. No adnexal mass or tenderness. Chaperone present during the pelvic exam EXTREMITIES: No cyanosis, clubbing, or edema, 2+ distal pulses.     Assessment:    Healthy female exam.    Plan:    Pap smear collected STI screening ordered Patient will be contacted with abnormal results Reviewed contraception options with the patient and she opted for birth control pills- Rx loestrin provided Discussed role of spironolactone with hirsutism and patient agreed to a 61-month trial. Patient understands that it is a teratogen and needs to continue COC while taking it.  Patient informed of risks of potassium abnormality with usage of spironolactone and lisinopril. Patient plans to discontinue lisinopril She declined referral to DM/nutritionist educator - Patient advised to optimize diabetes before conception

## 2022-06-15 NOTE — Progress Notes (Signed)
32 y.o. New GYN presents for AEX/PAP/STD screening.  Pt is concerned about facial hair, mostly on her chin.

## 2022-06-16 LAB — HEPATITIS C ANTIBODY: Hep C Virus Ab: NONREACTIVE

## 2022-06-16 LAB — RPR: RPR Ser Ql: NONREACTIVE

## 2022-06-16 LAB — HEPATITIS B SURFACE ANTIGEN: Hepatitis B Surface Ag: NEGATIVE

## 2022-06-16 LAB — CYTOLOGY - PAP
Adequacy: ABSENT
Comment: NEGATIVE
Diagnosis: NEGATIVE
High risk HPV: NEGATIVE

## 2022-06-16 LAB — CERVICOVAGINAL ANCILLARY ONLY
Chlamydia: NEGATIVE
Comment: NEGATIVE
Comment: NORMAL
Neisseria Gonorrhea: NEGATIVE

## 2022-06-16 LAB — HIV ANTIBODY (ROUTINE TESTING W REFLEX): HIV Screen 4th Generation wRfx: NONREACTIVE

## 2022-06-16 NOTE — Telephone Encounter (Signed)
I see there is a note stating patient will need a OV for refill? Do I need to inform patient and get her scheduled?

## 2022-06-16 NOTE — Addendum Note (Signed)
Addended by: Hillard Danker A on: 06/16/2022 09:21 AM   Modules accepted: Orders

## 2022-07-06 ENCOUNTER — Encounter: Payer: Self-pay | Admitting: Internal Medicine

## 2022-07-06 ENCOUNTER — Ambulatory Visit: Payer: 59 | Admitting: Internal Medicine

## 2022-07-06 VITALS — BP 120/80 | HR 82 | Temp 98.6°F | Ht 70.0 in | Wt 254.0 lb

## 2022-07-06 DIAGNOSIS — E1169 Type 2 diabetes mellitus with other specified complication: Secondary | ICD-10-CM | POA: Diagnosis not present

## 2022-07-06 DIAGNOSIS — E118 Type 2 diabetes mellitus with unspecified complications: Secondary | ICD-10-CM

## 2022-07-06 DIAGNOSIS — Z794 Long term (current) use of insulin: Secondary | ICD-10-CM

## 2022-07-06 DIAGNOSIS — Z6836 Body mass index (BMI) 36.0-36.9, adult: Secondary | ICD-10-CM

## 2022-07-06 LAB — POCT GLYCOSYLATED HEMOGLOBIN (HGB A1C): HbA1c POC (<> result, manual entry): 8.9 % (ref 4.0–5.6)

## 2022-07-06 MED ORDER — TRULICITY 3 MG/0.5ML ~~LOC~~ SOAJ: 3.0000 mg | SUBCUTANEOUS | 3 refills | Status: DC

## 2022-07-06 MED ORDER — TRULICITY 1.5 MG/0.5ML ~~LOC~~ SOAJ: 1.5000 mg | SUBCUTANEOUS | 0 refills | Status: DC

## 2022-07-06 NOTE — Patient Instructions (Signed)
We will go to 1.5 mg trulicity for 1 month then increase to 3 mg trulicity weekly.

## 2022-07-06 NOTE — Progress Notes (Unsigned)
   Subjective:   Patient ID: Samantha Lucas, female    DOB: [birth year], 32 y.o.   MRN: [id number]  HPI The patient is a 32 YO female coming in for follow up.   Review of Systems  Constitutional: Negative.   HENT: Negative.    Eyes: Negative.   Respiratory:  Negative for cough, chest tightness and shortness of breath.   Cardiovascular:  Negative for chest pain, palpitations and leg swelling.  Gastrointestinal:  Negative for abdominal distention, abdominal pain, constipation, diarrhea, nausea and vomiting.  Musculoskeletal: Negative.   Skin: Negative.   Neurological: Negative.   Psychiatric/Behavioral: Negative.      Objective:  Physical Exam Constitutional:      Appearance: She is well-developed.  HENT:     Head: Normocephalic and atraumatic.  Cardiovascular:     Rate and Rhythm: Normal rate and regular rhythm.  Pulmonary:     Effort: Pulmonary effort is normal. No respiratory distress.     Breath sounds: Normal breath sounds. No wheezing or rales.  Abdominal:     General: Bowel sounds are normal. There is no distension.     Palpations: Abdomen is soft.     Tenderness: There is no abdominal tenderness. There is no rebound.  Musculoskeletal:     Cervical back: Normal range of motion.  Skin:    General: Skin is warm and dry.  Neurological:     Mental Status: She is alert and oriented to person, place, and time.     Coordination: Coordination normal.     Vitals:   07/06/22 1451  BP: 120/80  Pulse: 82  Temp: 98.6 F (37 C)  TempSrc: Oral  SpO2: 99%  Weight: 254 lb (115.2 kg)  Height: 5\' 10"  (1.778 m)    Assessment & Plan:

## 2022-07-09 ENCOUNTER — Encounter: Payer: Self-pay | Admitting: Internal Medicine

## 2022-07-09 NOTE — Assessment & Plan Note (Signed)
POC HgA1c done at visit and improving to 8.9 today. Still in moderate exacerbation. Pharmacy has not done her trulicity titration as we prescribed and she is unsure if she is taking 0.75 or 1.5 mg weekly. If 0.75 we will increase to 1.5 mg weekly for 1 month then increase to 3 mg weekly. If at 1.5 mg weekly we will increase to 3 mg weekly now. Continue glipizide 10 mg BID. Follow up 3 months and can continue increasing trulicity as tolerated.

## 2022-07-09 NOTE — Assessment & Plan Note (Signed)
Weight is up from last visit. We are increasing trulicity to help with weight loss and sugar control.

## 2022-07-30 ENCOUNTER — Encounter: Payer: Self-pay | Admitting: Obstetrics and Gynecology

## 2022-08-18 ENCOUNTER — Other Ambulatory Visit: Payer: Self-pay | Admitting: Obstetrics and Gynecology

## 2022-09-02 ENCOUNTER — Encounter: Payer: Self-pay | Admitting: Internal Medicine

## 2022-09-02 DIAGNOSIS — R051 Acute cough: Secondary | ICD-10-CM

## 2022-09-02 MED ORDER — ALBUTEROL SULFATE HFA 108 (90 BASE) MCG/ACT IN AERS: 2.0000 | INHALATION_SPRAY | Freq: Four times a day (QID) | RESPIRATORY_TRACT | 0 refills | Status: DC | PRN

## 2022-09-03 ENCOUNTER — Other Ambulatory Visit (HOSPITAL_COMMUNITY): Payer: Self-pay

## 2022-09-10 ENCOUNTER — Encounter (INDEPENDENT_AMBULATORY_CARE_PROVIDER_SITE_OTHER): Payer: Self-pay

## 2022-09-22 ENCOUNTER — Encounter: Payer: Self-pay | Admitting: Obstetrics and Gynecology

## 2022-09-29 ENCOUNTER — Other Ambulatory Visit: Payer: Self-pay | Admitting: Internal Medicine

## 2022-09-29 DIAGNOSIS — R051 Acute cough: Secondary | ICD-10-CM

## 2022-10-05 ENCOUNTER — Ambulatory Visit: Payer: 59 | Admitting: Internal Medicine

## 2022-10-11 ENCOUNTER — Telehealth: Payer: 59 | Admitting: Nurse Practitioner

## 2022-10-11 DIAGNOSIS — B9689 Other specified bacterial agents as the cause of diseases classified elsewhere: Secondary | ICD-10-CM | POA: Diagnosis not present

## 2022-10-11 DIAGNOSIS — J988 Other specified respiratory disorders: Secondary | ICD-10-CM | POA: Diagnosis not present

## 2022-10-11 DIAGNOSIS — J069 Acute upper respiratory infection, unspecified: Secondary | ICD-10-CM | POA: Diagnosis not present

## 2022-10-11 MED ORDER — FLUTICASONE PROPIONATE 50 MCG/ACT NA SUSP: 2.0000 | Freq: Every day | NASAL | 6 refills | Status: DC

## 2022-10-11 MED ORDER — BENZONATATE 200 MG PO CAPS: 200.0000 mg | ORAL_CAPSULE | Freq: Two times a day (BID) | ORAL | 0 refills | Status: DC | PRN

## 2022-10-11 NOTE — Progress Notes (Signed)

## 2022-10-12 ENCOUNTER — Ambulatory Visit
Admission: EM | Admit: 2022-10-12 | Discharge: 2022-10-12 | Disposition: A | Discharge status: Home / Self Care | Payer: 59 | Attending: Family Medicine | Admitting: Family Medicine

## 2022-10-12 DIAGNOSIS — J069 Acute upper respiratory infection, unspecified: Secondary | ICD-10-CM

## 2022-10-12 DIAGNOSIS — H65191 Other acute nonsuppurative otitis media, right ear: Secondary | ICD-10-CM

## 2022-10-12 MED ORDER — PREDNISONE 20 MG PO TABS: 40.0000 mg | ORAL_TABLET | Freq: Every day | ORAL | 0 refills | Status: DC

## 2022-10-12 MED ORDER — PROMETHAZINE-DM 6.25-15 MG/5ML PO SYRP: 5.0000 mL | ORAL_SOLUTION | Freq: Four times a day (QID) | ORAL | 0 refills | Status: DC | PRN

## 2022-10-12 NOTE — Discharge Instructions (Signed)
In addition to over-the-counter medications such as DayQuil and NyQuil, you may use Flonase nasal spray twice daily and I have sent over a good cough syrup and prednisone.  The prednisone should help with the inflammation in your sinuses and inner ear as well as your cough, hoarseness.  Follow-up for significantly worsening symptoms.

## 2022-10-12 NOTE — ED Triage Notes (Signed)
Productive cough with green mucus, right ear pain, scratchy throat that started Friday. Taking honey tea, OTC sinus cold medication, and inhaler with no relief.

## 2022-10-16 ENCOUNTER — Encounter: Payer: Self-pay | Admitting: Internal Medicine

## 2022-10-16 NOTE — ED Provider Notes (Signed)
[facility]    CSN: [id number] Arrival date & time: 10/12/22  1157      History   Chief Complaint Chief Complaint  Patient presents with   Cough    HPI Samantha Lucas is a 32 y.o. female.   Patient presenting today with several day history of productive cough, right ear pain, scratchy throat.  Denies chest pain, shortness of breath, fever, abdominal pain, nausea vomiting or diarrhea.  So far trying honey tea, over-the-counter cold and congestion medication and her albuterol inhaler with minimal relief.  Unsure if any sick contacts recently.    Past Medical History:  Diagnosis Date   Back pain    Diabetes mellitus without complication [facility]) May 2014   Scoliosis (and kyphoscoliosis), idiopathic    Viral cardiomyopathy (HCC) 2016    Patient Active Problem List   Diagnosis Date Noted   Vitamin D deficiency 12/27/2019   Diabetes mellitus (HCC) 11/27/2019   Routine general medical examination at a health care facility 11/14/2013   Microcytic anemia 07/17/2013   Morbid obesity (HCC) 03/23/2011   SCOLIOSIS-IDIOPATHIC 09/05/2007    Past Surgical History:  Procedure Laterality Date   WISDOM TOOTH EXTRACTION  dec 2012    OB History     Gravida  0   Para  0   Term  0   Preterm  0   AB  0   Living  0      SAB  0   IAB  0   Ectopic  0   Multiple  0   Live Births  0            Home Medications    Prior to Admission medications   Medication Sig Start Date End Date Taking? Authorizing Provider  albuterol (VENTOLIN HFA) 108 (90 Base) MCG/ACT inhaler Inhale 2 puffs into the lungs every 6 (six) hours as needed for wheezing or shortness of breath. 08/27/20  Yes Margaretann Loveless, PA-C  albuterol (VENTOLIN HFA) 108 (90 Base) MCG/ACT inhaler TAKE 2 PUFFS BY MOUTH EVERY 6 HOURS AS NEEDED FOR WHEEZE OR SHORTNESS OF BREATH 09/29/22  Yes Myrlene Broker, MD  Dulaglutide (TRULICITY) 1.5 MG/0.5ML SOPN Inject 1.5 mg into the skin once a week.  07/06/22  Yes Myrlene Broker, MD  Dulaglutide (TRULICITY) 3 MG/0.5ML SOPN Inject 3 mg as directed once a week. 07/06/22  Yes Myrlene Broker, MD  glipiZIDE (GLUCOTROL) 10 MG tablet Take 1 tablet (10 mg total) by mouth 2 (two) times daily before a meal. 09/19/21  Yes Myrlene Broker, MD  Glucose Blood (BLOOD GLUCOSE TEST STRIPS) STRP Use as directed to monitor FSBS 2x daily. Dx: E11.9. 12/24/16  Yes [place], Velna Hatchet, MD  Multiple Vitamin (MULITIVITAMIN WITH MINERALS) TABS Take 1 tablet by mouth daily.   Yes [provider]  Norethindrone Acetate-Ethinyl Estrad-FE (LOESTRIN 24 FE) 1-20 MG-MCG(24) tablet Take 1 tablet by mouth daily. 06/15/22  Yes Constant, Peggy, MD  predniSONE (DELTASONE) 20 MG tablet Take 2 tablets (40 mg total) by mouth daily with breakfast. 10/12/22  Yes Particia Nearing, PA-C  promethazine-dextromethorphan (PROMETHAZINE-DM) 6.25-15 MG/5ML syrup Take 5 mLs by mouth 4 (four) times daily as needed. 10/12/22  Yes Particia Nearing, PA-C  spironolactone (ALDACTONE) 50 MG tablet Take 1 tablet (50 mg total) by mouth 2 (two) times daily. Will start with 50 mg twice daily, and increase to 100 mg twice daily as needed 06/15/22  Yes Constant, Gigi Gin, MD  benzonatate (TESSALON) 200  MG capsule Take 1 capsule (200 mg total) by mouth 2 (two) times daily as needed for cough. 10/11/22   Delorse Lek, FNP  fluticasone (FLONASE) 50 MCG/ACT nasal spray Place 2 sprays into both nostrils daily. 12/24/14   Allayne Butcher B, PA-C  fluticasone (FLONASE) 50 MCG/ACT nasal spray Place 2 sprays into both nostrils daily. 10/11/22   Delorse Lek, FNP  naproxen (NAPROSYN) 500 MG tablet Take 1 tablet (500 mg total) by mouth 2 (two) times daily with a meal. 12/20/20   Wallis Bamberg, PA-C  Vitamin D, Ergocalciferol, (DRISDOL) 1.25 MG (50000 UNIT) CAPS capsule Take 1 capsule (50,000 Units total) by mouth every 7 (seven) days. 04/06/22   Myrlene Broker, MD  Insulin Glargine [facility] 100 UNIT/ML Inject  20 units at bedtime Patient taking differently: Inject  10 units at bedtime 06/06/19 04/10/20  Salley Scarlet, MD    Family History Family History  Problem Relation Age of Onset   Hypertension Mother    Cancer Mother    Obesity Mother    Stroke Father    Hypertension Father    Diabetes Father    Cancer Paternal Grandmother        lung and breast cancer   Hypertension Paternal Grandmother     Social History Social History   Tobacco Use   Smoking status: Never   Smokeless tobacco: Never  Vaping Use   Vaping status: Never Used  Substance Use Topics   Alcohol use: Yes    Comment: RARELY   Drug use: No     Allergies   Metformin and related and Other   Review of Systems Review of Systems Per HPI  Physical Exam Triage Vital Signs ED Triage Vitals [10/12/22 1255]  Encounter Vitals Group     BP 134/69     Systolic BP Percentile      Diastolic BP Percentile      Pulse Rate 97     Resp 18     Temp 98.7 F (37.1 C)     Temp Source Oral     SpO2 98 %     Weight      Height      Head Circumference      Peak Flow      Pain Score 5     Pain Loc      Pain Education      Exclude from Growth Chart    No data found.  Updated Vital Signs BP 134/69 (BP Location: Right Arm)   Pulse 97   Temp 98.7 F (37.1 C) (Oral)   Resp 18   LMP 09/15/2022 (Exact Date)   SpO2 98%   Visual Acuity Right Eye Distance:   Left Eye Distance:   Bilateral Distance:    Right Eye Near:   Left Eye Near:    Bilateral Near:     Physical Exam Vitals and nursing note reviewed.  Constitutional:      Appearance: Normal appearance.  HENT:     Head: Atraumatic.     Right Ear: Tympanic membrane and external ear normal.     Left Ear: Tympanic membrane and external ear normal.     Nose: Rhinorrhea present.     Mouth/Throat:     Mouth: Mucous membranes are moist.     Pharynx: Posterior oropharyngeal erythema present.  Eyes:     Extraocular Movements:  Extraocular movements intact.     Conjunctiva/sclera: Conjunctivae normal.  Cardiovascular:  Rate and Rhythm: Normal rate and regular rhythm.     Heart sounds: Normal heart sounds.  Pulmonary:     Effort: Pulmonary effort is normal.     Breath sounds: Normal breath sounds. No wheezing.  Musculoskeletal:        General: Normal range of motion.     Cervical back: Normal range of motion and neck supple.  Skin:    General: Skin is warm and dry.  Neurological:     Mental Status: She is alert and oriented to person, place, and time.  Psychiatric:        Mood and Affect: Mood normal.        Thought Content: Thought content normal.      UC Treatments / Results  Labs (all labs ordered are listed, but only abnormal results are displayed) Labs Reviewed - No data to display  EKG   Radiology No results found.  Procedures Procedures (including critical care time)  Medications Ordered in UC Medications - No data to display  Initial Impression / Assessment and Plan / UC Course  I have reviewed the triage vital signs and the nursing notes.  Pertinent labs & imaging results that were available during my care of the patient were reviewed by me and considered in my medical decision making (see chart for details).     Treat with short burst of prednisone for middle ear effusion, suspect viral illness causing symptoms today.  Phenergan DM, supportive over-the-counter medications and home care additionally.  Return for worsening symptoms. Final Clinical Impressions(s) / UC Diagnoses   Final diagnoses:  Viral URI with cough  Acute MEE (middle ear effusion), right     Discharge Instructions      In addition to over-the-counter medications such as DayQuil and NyQuil, you may use Flonase nasal spray twice daily and I have sent over a good cough syrup and prednisone.  The prednisone should help with the inflammation in your sinuses and inner ear as well as your cough, hoarseness.   Follow-up for significantly worsening symptoms.    ED Prescriptions     Medication Sig Dispense Auth. Provider   predniSONE (DELTASONE) 20 MG tablet Take 2 tablets (40 mg total) by mouth daily with breakfast. 10 tablet Particia Nearing, PA-C   promethazine-dextromethorphan (PROMETHAZINE-DM) 6.25-15 MG/5ML syrup Take 5 mLs by mouth 4 (four) times daily as needed. 100 mL Particia Nearing, [place]      PDMP not reviewed this encounter.   Roosvelt Maser [place], [place] 10/16/22 [phone]

## 2022-10-24 ENCOUNTER — Telehealth: Payer: 59 | Admitting: Family Medicine

## 2022-10-24 DIAGNOSIS — R3989 Other symptoms and signs involving the genitourinary system: Secondary | ICD-10-CM

## 2022-10-24 MED ORDER — SULFAMETHOXAZOLE-TRIMETHOPRIM 800-160 MG PO TABS: 1.0000 | ORAL_TABLET | Freq: Two times a day (BID) | ORAL | 0 refills | Status: AC

## 2022-10-24 NOTE — Progress Notes (Signed)
E-Visit for Urinary Problems  We are sorry that you are not feeling well.  Here is how we plan to help!  Based on what you shared with me it looks like you most likely have a simple urinary tract infection.  A UTI (Urinary Tract Infection) is a bacterial infection of the bladder.  Most cases of urinary tract infections are simple to treat but a key part of your care is to encourage you to drink plenty of fluids and watch your symptoms carefully.  I have prescribed Bactrim DS One tablet twice a day for 5 days.  Your symptoms should gradually improve. Call us if the burning in your urine worsens, you develop worsening fever, back pain or pelvic pain or if your symptoms do not resolve after completing the antibiotic.  Urinary tract infections can be prevented by drinking plenty of water to keep your body hydrated.  Also be sure when you wipe, wipe from front to back and don't hold it in!  If possible, empty your bladder every 4 hours.  HOME CARE Drink plenty of fluids Compete the full course of the antibiotics even if the symptoms resolve Remember, when you need to go.go. Holding in your urine can increase the likelihood of getting a UTI! GET HELP RIGHT AWAY IF: You cannot urinate You get a  fever Worsening back pain occurs You see blood in your urine You feel sick to your stomach or throw up You feel like you are going to pass out  MAKE SURE YOU  Understand these instructions. Will watch your condition. Will get help right away if you are not doing well or get worse.   Thank you for choosing an e-visit.  Your e-visit answers were reviewed by a board certified advanced clinical practitioner to complete your personal care plan. Depending upon the condition, your plan could have included both over the counter or prescription medications.  Please review your pharmacy choice. Make sure the pharmacy is open so you can pick up prescription now. If there is a problem, you may contact  your provider through MyChart messaging and have the prescription routed to another pharmacy.  Your safety is important to us. If you have drug allergies check your prescription carefully.   For the next 24 hours you can use MyChart to ask questions about today's visit, request a non-urgent call back, or ask for a work or school excuse. You will get an email in the next two days asking about your experience. I hope that your e-visit has been valuable and will speed your recovery.   have provided 5 minutes of non face to face time during this encounter for chart review and documentation.    

## 2022-11-11 ENCOUNTER — Other Ambulatory Visit (HOSPITAL_COMMUNITY): Payer: Self-pay

## 2022-11-13 ENCOUNTER — Other Ambulatory Visit: Payer: Self-pay | Admitting: Internal Medicine

## 2022-11-13 DIAGNOSIS — Z794 Long term (current) use of insulin: Secondary | ICD-10-CM

## 2022-11-16 ENCOUNTER — Other Ambulatory Visit (HOSPITAL_COMMUNITY): Payer: Self-pay

## 2022-11-19 ENCOUNTER — Telehealth: Payer: Self-pay | Admitting: Pharmacy Technician

## 2022-11-19 ENCOUNTER — Other Ambulatory Visit (HOSPITAL_COMMUNITY): Payer: Self-pay

## 2022-11-19 NOTE — Telephone Encounter (Signed)
Pharmacy Patient Advocate Encounter   Received notification from CoverMyMeds that prior authorization for Trulicity 3MG /0.5ML auto-injectors is required/requested.   Insurance verification completed.   The patient is insured through [facility] .   Per test claim: PA required; PA submitted to [facility] via CoverMyMeds Key/confirmation #/EOC [id number] Status is pending

## 2022-12-02 ENCOUNTER — Emergency Department (HOSPITAL_COMMUNITY)
Admission: EM | Admit: 2022-12-02 | Discharge: 2022-12-02 | Disposition: A | Discharge status: Home / Self Care | Payer: 59 | Attending: Emergency Medicine | Admitting: Emergency Medicine

## 2022-12-02 ENCOUNTER — Encounter (HOSPITAL_COMMUNITY): Payer: Self-pay | Admitting: *Deleted

## 2022-12-02 ENCOUNTER — Other Ambulatory Visit: Payer: Self-pay

## 2022-12-02 ENCOUNTER — Emergency Department (HOSPITAL_BASED_OUTPATIENT_CLINIC_OR_DEPARTMENT_OTHER): Payer: 59

## 2022-12-02 DIAGNOSIS — E119 Type 2 diabetes mellitus without complications: Secondary | ICD-10-CM | POA: Insufficient documentation

## 2022-12-02 DIAGNOSIS — M7989 Other specified soft tissue disorders: Secondary | ICD-10-CM | POA: Diagnosis not present

## 2022-12-02 DIAGNOSIS — Z794 Long term (current) use of insulin: Secondary | ICD-10-CM | POA: Diagnosis not present

## 2022-12-02 DIAGNOSIS — I809 Phlebitis and thrombophlebitis of unspecified site: Secondary | ICD-10-CM | POA: Insufficient documentation

## 2022-12-02 DIAGNOSIS — D72829 Elevated white blood cell count, unspecified: Secondary | ICD-10-CM | POA: Diagnosis not present

## 2022-12-02 DIAGNOSIS — M79604 Pain in right leg: Secondary | ICD-10-CM | POA: Diagnosis present

## 2022-12-02 LAB — COMPREHENSIVE METABOLIC PANEL
ALT: 17 U/L (ref 0–44)
AST: 24 U/L (ref 15–41)
Albumin: 3.2 g/dL — ABNORMAL LOW (ref 3.5–5.0)
Alkaline Phosphatase: 98 U/L (ref 38–126)
Anion gap: 9 (ref 5–15)
BUN: 11 mg/dL (ref 6–20)
CO2: 21 mmol/L — ABNORMAL LOW (ref 22–32)
Calcium: 9.3 mg/dL (ref 8.9–10.3)
Chloride: 101 mmol/L (ref 98–111)
Creatinine, Ser: 0.58 mg/dL (ref 0.44–1.00)
GFR, Estimated: >60 mL/min (ref >60)
Glucose, Bld: 287 mg/dL — ABNORMAL HIGH (ref 70–99)
Potassium: 4 mmol/L (ref 3.5–5.1)
Sodium: 131 mmol/L — ABNORMAL LOW (ref 135–145)
Total Bilirubin: 0.3 mg/dL (ref <1.2)
Total Protein: 7.4 g/dL (ref 6.5–8.1)

## 2022-12-02 LAB — CBC
HCT: 41.9 % (ref 36.0–46.0)
Hemoglobin: 13.3 g/dL (ref 12.0–15.0)
MCH: 26.4 pg (ref 26.0–34.0)
MCHC: 31.7 g/dL (ref 30.0–36.0)
MCV: 83.3 fL (ref 80.0–100.0)
Platelets: 326 10*3/uL (ref 150–400)
RBC: 5.03 MIL/uL (ref 3.87–5.11)
RDW: 13.8 % (ref 11.5–15.5)
WBC: 13 10*3/uL — ABNORMAL HIGH (ref 4.0–10.5)
nRBC: 0 % (ref 0.0–0.2)

## 2022-12-02 NOTE — ED Triage Notes (Signed)
The pt is c/o rt leg pain since last Friday  no known injury lt leg is swollen  no previous history  she does nhave varicose veins

## 2022-12-02 NOTE — Discharge Instructions (Addendum)
Recommend to start daily 81mg  aspirin.  Can continue to wear compression socks to help with circulation.  Try to get off your feet for at least 30 mins after work, legs elevated above level of heart. 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 Can follow-up with vascular surgery.  Would also recommend to follow-up with PCP. Return here for new concerns.

## 2022-12-02 NOTE — Progress Notes (Signed)
Lower extremity venous duplex completed. Please see CV Procedures for preliminary results.  Initial finding reported to [employer], Charity fundraiser.  Shona Simpson, RVT 12/02/22 6:34 PM

## 2022-12-02 NOTE — ED Provider Notes (Signed)
[place] [facility] AT [facility] Provider Note   CSN: [id number] Arrival date & time: 12/02/22  1724     History  Chief Complaint  Patient presents with   Leg Pain    Samantha Lucas is a 32 y.o. female.  The history is provided by the patient and medical records.  Leg Pain  32 year old female with history of diabetes, anemia, obesity, presenting to the ED with right leg pain and swelling.  States she works at [employer] and part time at [employer] as well so is on her feet for a long hours of the day.  Beginning last Friday she started having increased pain in her right calf.  States pain is worse with weightbearing and ambulation.  She does have varicose veins chronically but these have seem worse recently.  She denies any fever or chills. She has been wearing support hose to try and help with circulation issues.  Home Medications Prior to Admission medications   Medication Sig Start Date End Date Taking? Authorizing Provider  albuterol (VENTOLIN HFA) 108 (90 Base) MCG/ACT inhaler Inhale 2 puffs into the lungs every 6 (six) hours as needed for wheezing or shortness of breath. 08/27/20   Margaretann Loveless, PA-C  albuterol (VENTOLIN HFA) 108 (90 Base) MCG/ACT inhaler TAKE 2 PUFFS BY MOUTH EVERY 6 HOURS AS NEEDED FOR WHEEZE OR SHORTNESS OF BREATH 09/29/22   Myrlene Broker, MD  benzonatate (TESSALON) 200 MG capsule Take 1 capsule (200 mg total) by mouth 2 (two) times daily as needed for cough. 10/11/22   Delorse Lek, FNP  Dulaglutide (TRULICITY) 1.5 MG/0.5ML SOPN Inject 1.5 mg into the skin once a week. 07/06/22   Myrlene Broker, MD  Dulaglutide (TRULICITY) 3 MG/0.5ML SOPN Inject 3 mg as directed once a week. 07/06/22   Myrlene Broker, MD  fluticasone Aleda Grana) 50 MCG/ACT nasal spray Place 2 sprays into both nostrils daily. 12/24/14   Allayne Butcher B, PA-C  fluticasone (FLONASE) 50 MCG/ACT nasal spray Place 2 sprays into both nostrils daily. 10/11/22   Delorse Lek, FNP  glipiZIDE (GLUCOTROL) 10 MG tablet TAKE 1 TABLET BY MOUTH TWICE A DAY BEFORE A MEAL 11/13/22   Myrlene Broker, MD  Glucose Blood (BLOOD GLUCOSE TEST STRIPS) STRP Use as directed to monitor FSBS 2x daily. Dx: E11.9. 12/24/16   Salley Scarlet, MD  Multiple Vitamin (MULITIVITAMIN WITH MINERALS) TABS Take 1 tablet by mouth daily.    [provider]  naproxen (NAPROSYN) 500 MG tablet Take 1 tablet (500 mg total) by mouth 2 (two) times daily with a meal. 12/20/20   Wallis Bamberg, PA-C  Norethindrone Acetate-Ethinyl Estrad-FE (LOESTRIN 24 FE) 1-20 MG-MCG(24) tablet Take 1 tablet by mouth daily. 06/15/22   Constant, Peggy, MD  predniSONE (DELTASONE) 20 MG tablet Take 2 tablets (40 mg total) by mouth daily with breakfast. 10/12/22   Particia Nearing, PA-C  promethazine-dextromethorphan (PROMETHAZINE-DM) 6.25-15 MG/5ML syrup Take 5 mLs by mouth 4 (four) times daily as needed. 10/12/22   Particia Nearing, PA-C  spironolactone (ALDACTONE) 50 MG tablet Take 1 tablet (50 mg total) by mouth 2 (two) times daily. Will start with 50 mg twice daily, and increase to 100 mg twice daily as needed 06/15/22   Constant, Peggy, MD  Vitamin D, Ergocalciferol, (DRISDOL) 1.25 MG (50000 UNIT) CAPS capsule Take 1 capsule (50,000 Units total) by mouth every 7 (seven) days. 04/06/22   Myrlene Broker, MD  Insulin Glargine [facility])  100 UNIT/ML Inject  20 units at bedtime Patient taking differently: Inject  10 units at bedtime 06/06/19 04/10/20  Salley Scarlet, MD      Allergies    Metformin and related and Other    Review of Systems   Review of Systems  Musculoskeletal:  Positive for arthralgias.  All other systems reviewed and are negative.   Physical Exam Updated Vital Signs BP (!) 137/92   Pulse 91   Temp 99.1 F (37.3 C)   Resp 17   Ht 5\' 10"  (1.778 m)   Wt 115.2 kg   LMP 11/09/2022   SpO2 98%   BMI 36.44 kg/m   Physical Exam Vitals and nursing note  reviewed.  Constitutional:      Appearance: She is well-developed.  HENT:     Head: Normocephalic and atraumatic.  Eyes:     Conjunctiva/sclera: Conjunctivae normal.     Pupils: Pupils are equal, round, and reactive to light.  Cardiovascular:     Rate and Rhythm: Normal rate and regular rhythm.     Heart sounds: Normal heart sounds.  Pulmonary:     Effort: Pulmonary effort is normal.     Breath sounds: Normal breath sounds.  Abdominal:     General: Bowel sounds are normal.     Palpations: Abdomen is soft.  Musculoskeletal:        General: Normal range of motion.     Cervical back: Normal range of motion.     Comments: Significant varicose veins of BLE, right does seem slightly worse than left, there is some tenderness along proximal medial calf, leg is NVI  Skin:    General: Skin is warm and dry.  Neurological:     Mental Status: She is alert and oriented to person, place, and time.     ED Results / Procedures / Treatments   Labs (all labs ordered are listed, but only abnormal results are displayed) Labs Reviewed  COMPREHENSIVE METABOLIC PANEL - Abnormal; Notable for the following components:      Result Value   Sodium 131 (*)    CO2 21 (*)    Glucose, Bld 287 (*)    Albumin 3.2 (*)    All other components within normal limits  CBC - Abnormal; Notable for the following components:   WBC 13.0 (*)    All other components within normal limits    EKG None  Radiology VAS [place] LOWER EXTREMITY VENOUS (DVT) (ONLY [facility] & [facility])  Result Date: 12/02/2022  Lower Venous DVT Study Patient Name:  PHILLIPA MORDEN Longie  Date of Exam:   12/02/2022 Medical Rec #: [id number]     Accession #:    [id number] Date of Birth: March 16, 1990     Patient Gender: F Patient Age:   32 years Exam Location:  [facility] Procedure:      VAS [place] LOWER EXTREMITY VENOUS (DVT) Referring Phys: DAVID YAO --------------------------------------------------------------------------------  Indications: Swelling, and Pain.   Risk Factors: None identified. Comparison Study: No prior study Performing Technologist: Shona Simpson  Examination Guidelines: A complete evaluation includes B-mode imaging, spectral Doppler, color Doppler, and power Doppler as needed of all accessible portions of each vessel. Bilateral testing is considered an integral part of a complete examination. Limited examinations for reoccurring indications may be performed as noted. The reflux portion of the exam is performed with the patient in reverse Trendelenburg.  +---------+---------------+---------+-----------+----------+--------------+ RIGHT    CompressibilityPhasicitySpontaneityPropertiesThrombus Aging +---------+---------------+---------+-----------+----------+--------------+ CFV      Full  Yes      Yes                                 +---------+---------------+---------+-----------+----------+--------------+ SFJ      Full                                                        +---------+---------------+---------+-----------+----------+--------------+ FV Prox  Full                                                        +---------+---------------+---------+-----------+----------+--------------+ FV Mid   Full                                                        +---------+---------------+---------+-----------+----------+--------------+ FV DistalFull                                                        +---------+---------------+---------+-----------+----------+--------------+ PFV      Full                                                        +---------+---------------+---------+-----------+----------+--------------+ POP      Full           Yes      Yes                                 +---------+---------------+---------+-----------+----------+--------------+ PTV      Full                                                         +---------+---------------+---------+-----------+----------+--------------+ PERO     Full                                                        +---------+---------------+---------+-----------+----------+--------------+ VV       None           No       No         dilated   Acute          +---------+---------------+---------+-----------+----------+--------------+ Varicose Veins thrombosed in the proximal through mid calf  +----+---------------+---------+-----------+----------+--------------+ LEFTCompressibilityPhasicitySpontaneityPropertiesThrombus Aging +----+---------------+---------+-----------+----------+--------------+ CFV Full  Yes      Yes                                 +----+---------------+---------+-----------+----------+--------------+     Summary: RIGHT: - Findings consistent with acute superficial vein thrombosis involving the right varicosities or other superficial veins.  - There is no evidence of deep vein thrombosis in the lower extremity.  - No cystic structure found in the popliteal fossa.  LEFT: - No evidence of common femoral vein obstruction.   *See table(s) above for measurements and observations. Electronically signed by Coral Else MD on 12/02/2022 at 9:29:42 PM.    Final     Procedures Procedures    Medications Ordered in ED Medications - No data to display  ED Course/ Medical Decision Making/ A&P                                 Medical Decision Making Amount and/or Complexity of Data Reviewed Labs: ordered.   32 year old female presenting to the ED with right lower extremity pain.  She does have varicose veins of bilateral lower extremities, right is slightly worse than left.  There is some tenderness along the right proximal medial calf.  Her leg is neurovascularly intact, remains ambulatory.  Labs were obtained from triage and reviewed--white count 13,000, no fever or other infectious symptoms.  No findings of cellulitis on exam.   She is hyperglycemic without findings of DKA.  Vascular ultrasound obtained, no evidence of DVT but does have findings of superficial clots (phlebitis).  Will have her start daily ASA, continue compression stockings.  Will refer to vascular surgery for follow-up.  Also recommend to follow-up with PCP.  Can return here for new concerns.  Final Clinical Impression(s) / ED Diagnoses Final diagnoses:  Superficial phlebitis    Rx / DC Orders ED Discharge Orders     None         Garlon Hatchet, PA-C 12/02/22 2336    Charlynne Pander, MD 12/03/22 1606

## 2022-12-02 NOTE — ED Notes (Signed)
AVS provided by edp was discussed with pt. Pt verbalized understanding with no additional questions at this time. Pt wheelchair to car. Going home with family at bedside

## 2022-12-04 ENCOUNTER — Encounter: Payer: 59 | Admitting: Family Medicine

## 2022-12-25 ENCOUNTER — Other Ambulatory Visit: Payer: Self-pay | Admitting: Internal Medicine

## 2023-01-01 ENCOUNTER — Encounter: Payer: Self-pay | Admitting: Family Medicine

## 2023-01-04 ENCOUNTER — Encounter: Payer: Self-pay | Admitting: Obstetrics and Gynecology

## 2023-01-05 ENCOUNTER — Other Ambulatory Visit: Payer: Self-pay | Admitting: Obstetrics and Gynecology

## 2023-01-05 MED ORDER — SPIRONOLACTONE 50 MG PO TABS: 50.0000 mg | ORAL_TABLET | Freq: Two times a day (BID) | ORAL | 5 refills | Status: DC

## 2023-01-08 NOTE — Telephone Encounter (Signed)
Pharmacy Patient Advocate Encounter  Received notification from [facility] that Prior Authorization for Trulicity 3MG /0.5ML auto-injectors  has been APPROVED from 11/19/2022 to 11/19/2023   PA #/Case ID/Reference #: [id number]

## 2023-02-12 ENCOUNTER — Ambulatory Visit: Payer: 59 | Admitting: Family Medicine

## 2023-02-12 ENCOUNTER — Encounter: Payer: Self-pay | Admitting: Family Medicine

## 2023-02-12 VITALS — BP 126/78 | HR 82 | Temp 97.7°F | Ht 70.0 in | Wt 247.2 lb

## 2023-02-12 DIAGNOSIS — Z7985 Long-term (current) use of injectable non-insulin antidiabetic drugs: Secondary | ICD-10-CM

## 2023-02-12 DIAGNOSIS — E1165 Type 2 diabetes mellitus with hyperglycemia: Secondary | ICD-10-CM | POA: Diagnosis not present

## 2023-02-12 DIAGNOSIS — L680 Hirsutism: Secondary | ICD-10-CM | POA: Diagnosis not present

## 2023-02-12 MED ORDER — DEXCOM G7 SENSOR MISC: 1.0000 | 11 refills | Status: DC

## 2023-02-12 NOTE — Progress Notes (Signed)
Established Patient Office Visit   Subjective  Patient ID: Samantha Lucas, female    DOB: 1990-01-29  Age: 33 y.o. MRN: [id number]  Chief Complaint  Patient presents with   New Patient (Initial Visit)    TOC     Patient is a 33 year old female previously seen by Hillard Danker, MD who presents for [facility] and follow-up on chronic conditions.  DM2: Patient states she was diagnosed at age 89.  States carbs are her weakness.  Per chart review metformin caused GI upset.  Currently taking glipizide 10 mg twice daily and Trulicity 3 mg weekly.  Patient endorses recent hyperglycemia, blood sugar 500s.  Patient took extra dose of glipizide and 30 units of family members leftover insulin.  Patient states she was drinking more sodas that week.  Hirsutism: Increased hair on chin.  On spironolactone 50 mg daily and OCPs as prescribed by OB/GYN.  Not sure making much of a difference.  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    Patient Active Problem List   Diagnosis Date Noted   Vitamin D deficiency 12/27/2019   Diabetes mellitus (HCC) 11/27/2019   Routine general medical examination at a health care facility 11/14/2013   Microcytic anemia 07/17/2013   Morbid obesity (HCC) 03/23/2011   Idiopathic scoliosis and kyphoscoliosis 09/05/2007   Past Medical History:  Diagnosis Date   Back pain    Diabetes mellitus without complication [facility]) May 2014   Scoliosis (and kyphoscoliosis), idiopathic    Viral cardiomyopathy (HCC) 2016   Past Surgical History:  Procedure Laterality Date   WISDOM TOOTH EXTRACTION  dec 2012   Social History   Tobacco Use   Smoking status: Never   Smokeless tobacco: Never  Vaping Use   Vaping status: Never Used  Substance Use Topics   Alcohol use: Yes    Comment: RARELY   Drug use: No   Family History  Problem Relation Age of Onset   Hypertension Mother     Cancer Mother    Obesity Mother    Stroke Father    Hypertension Father    Diabetes Father    Cancer Paternal Grandmother        lung and breast cancer   Hypertension Paternal Grandmother    Allergies  Allergen Reactions   Metformin And Related     GI upset    Other     [place]       ROS Negative unless stated above    Objective:     BP 126/78 (BP Location: Right Arm, Cuff Size: Large)   Pulse 82   Temp 97.7 F (36.5 C) (Oral)   Ht 5\' 10"  (1.778 m)   Wt 247 lb 3.2 oz (112.1 kg)   LMP 02/01/2023 (Exact Date)   SpO2 97%   BMI 35.47 kg/m  BP Readings from Last 3 Encounters:  02/12/23 126/78  12/02/22 (!) 137/92  10/12/22 134/69   Wt Readings from Last 3 Encounters:  02/12/23 247 lb 3.2 oz (112.1 kg)  12/02/22 253 lb 15.5 oz (115.2 kg)  07/06/22 254 lb (115.2 kg)      Physical Exam Constitutional:      General: She is not in acute distress.    Appearance: Normal appearance.  HENT:     Head: Normocephalic and atraumatic.     Nose: Nose normal.     Mouth/Throat:     Mouth: Mucous membranes  are moist.  Cardiovascular:     Rate and Rhythm: Normal rate and regular rhythm.     Heart sounds: Normal heart sounds. No murmur heard.    No gallop.  Pulmonary:     Effort: Pulmonary effort is normal. No respiratory distress.     Breath sounds: Normal breath sounds. No wheezing, rhonchi or rales.  Skin:    General: Skin is warm and dry.  Neurological:     Mental Status: She is alert and oriented to person, place, and time.       02/12/2023   11:04 AM 06/15/2022    9:27 AM 04/06/2022    3:13 PM  Depression screen PHQ 2/9  Decreased Interest 0 0 0  Down, Depressed, Hopeless 0 0 0  PHQ - 2 Score 0 0 0  Altered sleeping 0 0   Tired, decreased energy 0 0   Change in appetite 0 0   Feeling bad or failure about yourself  0 0   Trouble concentrating 0 0   Moving slowly or fidgety/restless 0 0   Suicidal thoughts 0 0   PHQ-9 Score 0 0   Difficult doing  work/chores Not difficult at all Not difficult at all       02/12/2023   11:04 AM 06/15/2022    9:27 AM  GAD 7 : Generalized Anxiety Score  Nervous, Anxious, on Edge 0 0  Control/stop worrying 0 0  Worry too much - different things 0 0  Trouble relaxing 0 0  Restless 0 0  Easily annoyed or irritable 1 0  Afraid - awful might happen 0 0  Total GAD 7 Score 1 0  Anxiety Difficulty Not difficult at all Not difficult at all      No results found for any visits on 02/12/23.    Assessment & Plan:  Type 2 diabetes mellitus with hyperglycemia, without long-term current use of insulin (HCC) -Hemoglobin A1c 8.9% on 07/06/2022.  Recheck this visit -Continue Trulicity 3 mg weekly, glipizide 10 mg twice daily. -Discussed importance of lifestyle modifications -Foot exam done in 06/2022 -Eye exam done 12/31/2022 -Obtain labs. -Sample Dexcom G7 reader.  No sample sensors available in clinic.  Given Rx for sensors. -     Comprehensive metabolic panel -     Hemoglobin A1c -     Microalbumin / creatinine urine ratio -     Dexcom G7 Sensor; 1 Device by Does not apply route every 14 (fourteen) days.  Dispense: 2 each; Refill: 11  Hirsutism -Continue spironolactone and OCPs per OB/GYN   Return in about 3 months (around 05/13/2023) for diabetes.   Deeann Saint, MD

## 2023-02-14 LAB — COMPREHENSIVE METABOLIC PANEL
ALT: 15 [IU]/L (ref 0–32)
AST: 14 [IU]/L (ref 0–40)
Albumin: 4.2 g/dL (ref 4.0–5.0)
Alkaline Phosphatase: 135 [IU]/L — ABNORMAL HIGH (ref 44–121)
BUN/Creatinine Ratio: 13 (ref 9–23)
BUN: 9 mg/dL (ref 6–20)
Bilirubin Total: 0.4 mg/dL (ref 0.0–1.2)
CO2: 23 mmol/L (ref 20–29)
Calcium: 9.6 mg/dL (ref 8.7–10.2)
Chloride: 99 mmol/L (ref 96–106)
Creatinine, Ser: 0.69 mg/dL (ref 0.57–1.00)
Globulin, Total: 2.9 g/dL (ref 1.5–4.5)
Glucose: 211 mg/dL — ABNORMAL HIGH (ref 70–99)
Potassium: 4.3 mmol/L (ref 3.5–5.2)
Sodium: 138 mmol/L (ref 134–144)
Total Protein: 7.1 g/dL (ref 6.0–8.5)
eGFR: 120 mL/min/{1.73_m2} (ref >59)

## 2023-02-14 LAB — HEMOGLOBIN A1C
Est. average glucose Bld gHb Est-mCnc: 315 mg/dL
Hgb A1c MFr Bld: 12.6 % — ABNORMAL HIGH (ref 4.8–5.6)

## 2023-02-14 LAB — MICROALBUMIN / CREATININE URINE RATIO: Creatinine, Urine: 131.2 mg/dL

## 2023-02-19 ENCOUNTER — Encounter: Payer: Self-pay | Admitting: Family Medicine

## 2023-02-19 ENCOUNTER — Encounter: Payer: Self-pay | Admitting: Obstetrics and Gynecology

## 2023-02-22 ENCOUNTER — Telehealth: Payer: Self-pay

## 2023-02-22 ENCOUNTER — Other Ambulatory Visit (HOSPITAL_COMMUNITY): Payer: Self-pay

## 2023-02-22 NOTE — Telephone Encounter (Signed)
Pharmacy Patient Advocate Encounter   Received notification from Pt Calls Messages that prior authorization for Dexcom G7 Sensor is required/requested.   Insurance verification completed.   The patient is insured through [facility] .   Per test claim: PA required; PA submitted to above mentioned insurance via CoverMyMeds Key/confirmation #/EOC [id number] Status is pending

## 2023-02-22 NOTE — Telephone Encounter (Signed)
PA request has been Submitted. New Encounter created for follow up. For additional info see Pharmacy Prior Auth telephone encounter from 02/22/23.

## 2023-02-22 NOTE — Telephone Encounter (Signed)
Copied from CRM [id number]. Topic: General - Other >> Feb 22, 2023 11:06 AM Hector Shade B wrote: Reason for CRM: Patient is requesting a call back  on the prior authorization status fro the dexcom: [id number]

## 2023-02-23 NOTE — Telephone Encounter (Signed)
Pharmacy Patient Advocate Encounter  Received notification from [facility] that Prior Authorization for Dexcom G7 Sensor  has been DENIED.  See denial reason below. No denial letter attached in CMM. Will attach denial letter to Media tab once received.   PA #/Case ID/Reference #: [id number]   DENIAL REASON: Criteria not met

## 2023-02-23 NOTE — Telephone Encounter (Signed)
error

## 2023-03-01 ENCOUNTER — Telehealth: Payer: 59 | Admitting: Physician Assistant

## 2023-03-01 DIAGNOSIS — R3989 Other symptoms and signs involving the genitourinary system: Secondary | ICD-10-CM | POA: Diagnosis not present

## 2023-03-01 MED ORDER — NITROFURANTOIN MONOHYD MACRO 100 MG PO CAPS: 100.0000 mg | ORAL_CAPSULE | Freq: Two times a day (BID) | ORAL | 0 refills | Status: DC

## 2023-03-01 NOTE — Progress Notes (Signed)

## 2023-03-02 ENCOUNTER — Other Ambulatory Visit: Payer: Self-pay

## 2023-03-02 DIAGNOSIS — I872 Venous insufficiency (chronic) (peripheral): Secondary | ICD-10-CM

## 2023-03-03 NOTE — Telephone Encounter (Signed)
 Advised patient to schedule follow-up appointment to discuss further.

## 2023-03-04 ENCOUNTER — Telehealth: Payer: 59 | Admitting: Family Medicine

## 2023-03-04 DIAGNOSIS — B379 Candidiasis, unspecified: Secondary | ICD-10-CM

## 2023-03-04 MED ORDER — FLUCONAZOLE 150 MG PO TABS: 150.0000 mg | ORAL_TABLET | ORAL | 0 refills | Status: DC

## 2023-03-04 NOTE — Progress Notes (Signed)

## 2023-03-12 ENCOUNTER — Encounter (HOSPITAL_COMMUNITY): Payer: 59

## 2023-03-16 ENCOUNTER — Telehealth: Payer: Self-pay | Admitting: Family Medicine

## 2023-03-16 ENCOUNTER — Telehealth: Payer: Self-pay

## 2023-03-16 NOTE — Telephone Encounter (Signed)
Called pt, left vm to r/s appt due to weather.

## 2023-03-16 NOTE — Telephone Encounter (Signed)
Copied from CRM [id number]. Topic: Clinical - Medical Advice >> Mar 16, 2023 10:47 AM Samantha Lucas wrote: Reason for CRM: Patient would like to confirm if an appointment is necessary, states her appointment is just to discuss her taking an insulin due to her elevated sugar. Patient states she rather not pay Lucas copay for Lucas visit when this can be discussed over the phone.

## 2023-03-17 ENCOUNTER — Ambulatory Visit: Payer: 59 | Admitting: Family Medicine

## 2023-03-17 ENCOUNTER — Ambulatory Visit (HOSPITAL_COMMUNITY): Payer: 59

## 2023-03-22 ENCOUNTER — Other Ambulatory Visit: Payer: Self-pay | Admitting: Family Medicine

## 2023-03-22 DIAGNOSIS — E1165 Type 2 diabetes mellitus with hyperglycemia: Secondary | ICD-10-CM

## 2023-03-22 NOTE — Telephone Encounter (Signed)
 As recent hemoglobin A1c greater than 12% a referral to Endocrinology placed.  Patient should expect a phone call about setting up this appointment in the next few weeks.

## 2023-03-26 ENCOUNTER — Other Ambulatory Visit: Payer: Self-pay

## 2023-03-26 ENCOUNTER — Ambulatory Visit: Payer: Self-pay | Admitting: Family Medicine

## 2023-03-26 ENCOUNTER — Emergency Department (HOSPITAL_COMMUNITY)
Admission: EM | Admit: 2023-03-26 | Discharge: 2023-03-26 | Disposition: A | Discharge status: Home / Self Care | Payer: 59 | Attending: Emergency Medicine | Admitting: Emergency Medicine

## 2023-03-26 ENCOUNTER — Encounter (HOSPITAL_COMMUNITY): Payer: Self-pay

## 2023-03-26 DIAGNOSIS — R739 Hyperglycemia, unspecified: Secondary | ICD-10-CM

## 2023-03-26 DIAGNOSIS — Z794 Long term (current) use of insulin: Secondary | ICD-10-CM | POA: Diagnosis not present

## 2023-03-26 DIAGNOSIS — Z79899 Other long term (current) drug therapy: Secondary | ICD-10-CM | POA: Insufficient documentation

## 2023-03-26 DIAGNOSIS — Z7984 Long term (current) use of oral hypoglycemic drugs: Secondary | ICD-10-CM | POA: Insufficient documentation

## 2023-03-26 DIAGNOSIS — E1165 Type 2 diabetes mellitus with hyperglycemia: Secondary | ICD-10-CM | POA: Insufficient documentation

## 2023-03-26 LAB — I-STAT VENOUS BLOOD GAS, ED
Acid-base deficit: 3 mmol/L — ABNORMAL HIGH (ref 0.0–2.0)
Bicarbonate: 24.4 mmol/L (ref 20.0–28.0)
Calcium, Ion: 1.25 mmol/L (ref 1.15–1.40)
HCT: 42 % (ref 36.0–46.0)
Hemoglobin: 14.3 g/dL (ref 12.0–15.0)
O2 Saturation: 61 %
Potassium: 3.7 mmol/L (ref 3.5–5.1)
Sodium: 136 mmol/L (ref 135–145)
TCO2: 26 mmol/L (ref 22–32)
pCO2, Ven: 51.6 mm[Hg] (ref 44–60)
pH, Ven: 7.283 (ref 7.25–7.43)
pO2, Ven: 36 mm[Hg] (ref 32–45)

## 2023-03-26 LAB — URINALYSIS, ROUTINE W REFLEX MICROSCOPIC
Bacteria, UA: NONE SEEN
Bilirubin Urine: NEGATIVE
Glucose, UA: >=500 mg/dL — AB
Hgb urine dipstick: NEGATIVE
Ketones, ur: NEGATIVE mg/dL
Leukocytes,Ua: NEGATIVE
Nitrite: NEGATIVE
Protein, ur: NEGATIVE mg/dL
Specific Gravity, Urine: 1.027 (ref 1.005–1.030)
pH: 5 (ref 5.0–8.0)

## 2023-03-26 LAB — CBC
HCT: 40.3 % (ref 36.0–46.0)
Hemoglobin: 12.8 g/dL (ref 12.0–15.0)
MCH: 26.7 pg (ref 26.0–34.0)
MCHC: 31.8 g/dL (ref 30.0–36.0)
MCV: 84 fL (ref 80.0–100.0)
Platelets: 322 10*3/uL (ref 150–400)
RBC: 4.8 MIL/uL (ref 3.87–5.11)
RDW: 14.1 % (ref 11.5–15.5)
WBC: 9.9 10*3/uL (ref 4.0–10.5)
nRBC: 0 % (ref 0.0–0.2)

## 2023-03-26 LAB — COMPREHENSIVE METABOLIC PANEL
ALT: 19 U/L (ref 0–44)
AST: 17 U/L (ref 15–41)
Albumin: 3.2 g/dL — ABNORMAL LOW (ref 3.5–5.0)
Alkaline Phosphatase: 109 U/L (ref 38–126)
Anion gap: 9 (ref 5–15)
BUN: 9 mg/dL (ref 6–20)
CO2: 23 mmol/L (ref 22–32)
Calcium: 9 mg/dL (ref 8.9–10.3)
Chloride: 101 mmol/L (ref 98–111)
Creatinine, Ser: 0.56 mg/dL (ref 0.44–1.00)
GFR, Estimated: >60 mL/min (ref >60)
Glucose, Bld: 330 mg/dL — ABNORMAL HIGH (ref 70–99)
Potassium: 3.7 mmol/L (ref 3.5–5.1)
Sodium: 133 mmol/L — ABNORMAL LOW (ref 135–145)
Total Bilirubin: 0.5 mg/dL (ref 0.0–1.2)
Total Protein: 7.2 g/dL (ref 6.5–8.1)

## 2023-03-26 LAB — I-STAT CHEM 8, ED
BUN: 11 mg/dL (ref 6–20)
Calcium, Ion: 1.15 mmol/L (ref 1.15–1.40)
Chloride: 103 mmol/L (ref 98–111)
Creatinine, Ser: 0.6 mg/dL (ref 0.44–1.00)
Glucose, Bld: 414 mg/dL — ABNORMAL HIGH (ref 70–99)
HCT: 41 % (ref 36.0–46.0)
Hemoglobin: 13.9 g/dL (ref 12.0–15.0)
Potassium: 4.5 mmol/L (ref 3.5–5.1)
Sodium: 134 mmol/L — ABNORMAL LOW (ref 135–145)
TCO2: 22 mmol/L (ref 22–32)

## 2023-03-26 LAB — CBG MONITORING, ED
Glucose-Capillary: 434 mg/dL — ABNORMAL HIGH (ref 70–99)
Glucose-Capillary: 312 mg/dL — ABNORMAL HIGH (ref 70–99)
Glucose-Capillary: 225 mg/dL — ABNORMAL HIGH (ref 70–99)

## 2023-03-26 LAB — BETA-HYDROXYBUTYRIC ACID: Beta-Hydroxybutyric Acid: 0.1 mmol/L (ref 0.05–0.27)

## 2023-03-26 LAB — HCG, SERUM, QUALITATIVE: Preg, Serum: NEGATIVE

## 2023-03-26 MED ORDER — INSULIN ASPART 100 UNIT/ML IJ SOLN
6.0000 [IU] | Freq: Once | INTRAMUSCULAR | Status: AC
  Administered 2023-03-26: 6 [IU] via INTRAVENOUS

## 2023-03-26 MED ORDER — SEMAGLUTIDE(0.25 OR 0.5MG/DOS) 2 MG/3ML ~~LOC~~ SOPN: 0.2500 mg | PEN_INJECTOR | SUBCUTANEOUS | 0 refills | Status: DC

## 2023-03-26 MED ORDER — SODIUM CHLORIDE 0.9 % IV BOLUS
1000.0000 mL | Freq: Once | INTRAVENOUS | Status: AC
  Administered 2023-03-26: 1000 mL via INTRAVENOUS

## 2023-03-26 NOTE — Telephone Encounter (Signed)
 Communication  Red Word that prompted transfer to Nurse Triage: blood sugar 531   Chief Complaint: Blood glucose 500 this morning. "I feel fine." Symptoms: Above Frequency: today Pertinent Negatives: Patient denies  Disposition: [x] ED /[] Urgent Care (no appt availability in office) / [] Appointment(In office/virtual)/ []  [place] [facility]/ [] Home Care/ [] Refused Recommended Disposition /[] [place] [facility]/ []  Follow-up with PCP Additional Notes: ED per the practice.  Reason for Disposition  Blood glucose > 500 mg/dL (21.3 mmol/L)  Answer Assessment - Initial Assessment Questions 1. BLOOD GLUCOSE: "What is your blood glucose level?"      531 2. ONSET: "When did you check the blood glucose?"     Today 3. USUAL RANGE: "What is your glucose level usually?" (e.g., usual fasting morning value, usual evening value)     200-300 4. KETONES: "Do you check for ketones (urine or blood test strips)?" If Yes, ask: "What does the test show now?"      No 5. TYPE 1 or 2:  "Do you know what type of diabetes you have?"  (e.g., Type 1, Type 2, Gestational; doesn't know)      Type 2 6. INSULIN: "Do you take insulin?" "What type of insulin(s) do you use? What is the mode of delivery? (syringe, pen; injection or pump)?"      No 7. DIABETES PILLS: "Do you take any pills for your diabetes?" If Yes, ask: "Have you missed taking any pills recently?"     Yes 8. OTHER SYMPTOMS: "Do you have any symptoms?" (e.g., fever, frequent urination, difficulty breathing, dizziness, weakness, vomiting)     Feels fine 9. PREGNANCY: "Is there any chance you are pregnant?" "When was your last menstrual period?"     No  Protocols used: Diabetes - [place] Blood Sugar-A-AH

## 2023-03-26 NOTE — Telephone Encounter (Signed)
  Chief Complaint: Glucose 500 this morning. Pt. Is at work."I feel fine." Symptoms: None Frequency: Today Pertinent Negatives: Patient denies symptoms Disposition: [x] ED /[] Urgent Care (no appt availability in office) / [] Appointment(In office/virtual)/ []  [place] [facility]/ [] Home Care/ [] Refused Recommended Disposition /[] [place] [facility]/ []  Follow-up with PCP Additional Notes: Spoke with Nelva Bush in the practice, no availability today. Pt. Will go to ED.

## 2023-03-26 NOTE — ED Triage Notes (Signed)
 Pt states she woke up this am and fsbs =300s and then it was 531. Pt denies weakness or dizziness. Pt has had headache. Pt denies pain at this time.

## 2023-03-26 NOTE — ED Provider Notes (Signed)
 [place] [facility] AT [facility] Provider Note   CSN: [id number] Arrival date & time: 03/26/23  0845     History  Chief Complaint  Patient presents with   Hyperglycemia    Samantha Lucas is a 33 y.o. female.  Pt is a 33 yo female with pmhx significant for DM.  She has been on Trulicity in the past, but the pharmacy does not have it.  She also said her insurance will not cover a dexcom patch.  Pt said she just started a new job, so has not been able to get the time off to see her doctor about her elevated blood sugars.  Pt said she woke up this am and her bs was 531.  She feels ok.         Home Medications Prior to Admission medications   Medication Sig Start Date End Date Taking? Authorizing Provider  albuterol (VENTOLIN HFA) 108 (90 Base) MCG/ACT inhaler TAKE 2 PUFFS BY MOUTH EVERY 6 HOURS AS NEEDED FOR WHEEZE OR SHORTNESS OF BREATH 09/29/22  Yes Myrlene Broker, MD  Cholecalciferol (D3 PO) Take 1 tablet by mouth daily.   Yes [provider]  Cyanocobalamin (B-12 PO) Take 1 tablet by mouth daily.   Yes [provider]  glipiZIDE (GLUCOTROL) 10 MG tablet TAKE 1 TABLET BY MOUTH TWICE A DAY BEFORE A MEAL 11/13/22  Yes Myrlene Broker, MD  MAGNESIUM PO Take 1 tablet by mouth daily as needed (Supplement).   Yes [provider]  Multiple Vitamin (MULITIVITAMIN WITH MINERALS) TABS Take 1 tablet by mouth daily.   Yes [provider]  Semaglutide,0.25 or 0.5MG /DOS, 2 MG/3ML SOPN Inject 0.25 mg into the skin every 7 (seven) days. 03/26/23  Yes Jacalyn Lefevre, MD  TRULICITY 3 MG/0.5ML SOAJ INJECT 3MG  UNDER THE SKIN AS DIRECTED ONCE WEEKLY. 12/28/22  Yes Myrlene Broker, MD  Zinc Sulfate (ZINC-220 PO) Take 1 tablet by mouth daily.   Yes [provider]  fluconazole (DIFLUCAN) 150 MG tablet Take 1 tablet (150 mg total) by mouth as directed. Repeat in 3 days as needed Patient not taking: Reported on 03/26/2023  03/04/23   Freddy Finner, NP  nitrofurantoin, macrocrystal-monohydrate, (MACROBID) 100 MG capsule Take 1 capsule (100 mg total) by mouth 2 (two) times daily. Patient not taking: Reported on 03/26/2023 03/01/23   Margaretann Loveless, PA-C  Norethindrone Acetate-Ethinyl Estrad-FE (LOESTRIN 24 FE) 1-20 MG-MCG(24) tablet Take 1 tablet by mouth daily. Patient not taking: Reported on 03/26/2023 06/15/22   Constant, Peggy, MD  spironolactone (ALDACTONE) 50 MG tablet Take 1 tablet (50 mg total) by mouth 2 (two) times daily. Will start with 50 mg twice daily, and increase to 100 mg twice daily as needed Patient not taking: Reported on 03/26/2023 01/05/23   Constant, Peggy, MD  Insulin Glargine (BASAGLAR KWIKPEN) 100 UNIT/ML Inject  20 units at bedtime Patient taking differently: Inject  10 units at bedtime 06/06/19 04/10/20  Salley Scarlet, MD      Allergies    Metformin and related and Other    Review of Systems   Review of Systems  Endocrine: Positive for polydipsia and polyuria.  All other systems reviewed and are negative.   Physical Exam Updated Vital Signs BP (!) 123/90   Pulse 89   Temp 99.2 F (37.3 C) (Oral)   Resp 20   Ht 5\' 10"  (1.778 m)   Wt 110.7 kg   LMP 02/23/2023 (Approximate)   SpO2  100%   BMI 35.01 kg/m  Physical Exam Vitals and nursing note reviewed.  Constitutional:      Appearance: Normal appearance. She is obese.  HENT:     Head: Normocephalic and atraumatic.     Right Ear: External ear normal.     Left Ear: External ear normal.     Nose: Nose normal.     Mouth/Throat:     Mouth: Mucous membranes are dry.  Eyes:     Extraocular Movements: Extraocular movements intact.     Conjunctiva/sclera: Conjunctivae normal.     Pupils: Pupils are equal, round, and reactive to light.  Cardiovascular:     Rate and Rhythm: Normal rate and regular rhythm.     Pulses: Normal pulses.     Heart sounds: Normal heart sounds.  Pulmonary:     Effort: Pulmonary effort is normal.      Breath sounds: Normal breath sounds.  Abdominal:     General: Abdomen is flat. Bowel sounds are normal.     Palpations: Abdomen is soft.  Musculoskeletal:        General: Normal range of motion.     Cervical back: Normal range of motion and neck supple.  Skin:    General: Skin is warm.     Capillary Refill: Capillary refill takes less than 2 seconds.  Neurological:     General: No focal deficit present.     Mental Status: She is alert and oriented to person, place, and time.  Psychiatric:        Mood and Affect: Mood normal.        Behavior: Behavior normal.     ED Results / Procedures / Treatments   Labs (all labs ordered are listed, but only abnormal results are displayed) Labs Reviewed  URINALYSIS, ROUTINE W REFLEX MICROSCOPIC - Abnormal; Notable for the following components:      Result Value   Color, Urine STRAW (*)    Glucose, UA >=500 (*)    All other components within normal limits  COMPREHENSIVE METABOLIC PANEL - Abnormal; Notable for the following components:   Sodium 133 (*)    Glucose, Bld 330 (*)    Albumin 3.2 (*)    All other components within normal limits  CBG MONITORING, ED - Abnormal; Notable for the following components:   Glucose-Capillary 434 (*)    All other components within normal limits  CBG MONITORING, ED - Abnormal; Notable for the following components:   Glucose-Capillary 312 (*)    All other components within normal limits  I-STAT CHEM 8, ED - Abnormal; Notable for the following components:   Sodium 134 (*)    Glucose, Bld 414 (*)    All other components within normal limits  I-STAT VENOUS BLOOD GAS, ED - Abnormal; Notable for the following components:   Acid-base deficit 3.0 (*)    All other components within normal limits  CBG MONITORING, ED - Abnormal; Notable for the following components:   Glucose-Capillary 225 (*)    All other components within normal limits  CBC  HCG, SERUM, QUALITATIVE  BETA-HYDROXYBUTYRIC ACID  CBG  MONITORING, ED    EKG None  Radiology No results found.  Procedures Procedures    Medications Ordered in ED Medications  sodium chloride 0.9 % bolus 1,000 mL (0 mLs Intravenous Stopped 03/26/23 1240)  insulin aspart (novoLOG) injection 6 Units (6 Units Intravenous Given 03/26/23 1131)    ED Course/ Medical Decision Making/ A&P  Medical Decision Making Amount and/or Complexity of Data Reviewed Labs: ordered.  Risk Prescription drug management.   This patient presents to the ED for concern of elevated blood sugar, this involves an extensive number of treatment options, and is a complaint that carries with it a [place] risk of complications and morbidity.  The differential diagnosis includes dka, hhs, hyperglycemia   Co morbidities that complicate the patient evaluation  dm   Additional history obtained:  Additional history obtained from epic chart review  Lab Tests:  I Ordered, and personally interpreted labs.  The pertinent results include:  [place] neg, preg neg, cbc nl, cmp nl other than bs 330  Medicines ordered and prescription drug management:  I ordered medication including ivfs/iv insulin  for sx  Reevaluation of the patient after these medicines showed that the patient improved I have reviewed the patients home medicines and have made adjustments as needed   Critical Interventions:  Ivfs/iv insulin   Problem List / ED Course:  Hyperglycemia:  pt's bs is elevated, but has come down with ivfs and iv insulin.  No signs of DKA.  She feels well and is in no distress.  She is d/c with ozempic.  She is to return if worse.  F/u with pcp.   Reevaluation:  After the interventions noted above, I reevaluated the patient and found that they have :improved   Social Determinants of Health:  Lives at home   Dispostion:  After consideration of the diagnostic results and the patients response to treatment, I feel that the patent  would benefit from discharge with outpatient f/u.          Final Clinical Impression(s) / ED Diagnoses Final diagnoses:  Hyperglycemia    Rx / DC Orders ED Discharge Orders          Ordered    Semaglutide,0.25 or 0.5MG /DOS, 2 MG/3ML SOPN  Every 7 days        03/26/23 1303              Jacalyn Lefevre, MD 03/26/23 1348

## 2023-03-31 ENCOUNTER — Encounter: Payer: Self-pay | Admitting: Family Medicine

## 2023-03-31 ENCOUNTER — Ambulatory Visit: Payer: 59 | Admitting: Family Medicine

## 2023-03-31 VITALS — BP 124/80 | HR 73 | Temp 99.1°F | Ht 70.0 in | Wt 249.2 lb

## 2023-03-31 DIAGNOSIS — Z6835 Body mass index (BMI) 35.0-35.9, adult: Secondary | ICD-10-CM

## 2023-03-31 DIAGNOSIS — E1165 Type 2 diabetes mellitus with hyperglycemia: Secondary | ICD-10-CM

## 2023-03-31 DIAGNOSIS — Z794 Long term (current) use of insulin: Secondary | ICD-10-CM

## 2023-03-31 DIAGNOSIS — E66812 Obesity, class 2: Secondary | ICD-10-CM | POA: Diagnosis not present

## 2023-03-31 MED ORDER — INSULIN GLARGINE 100 UNITS/ML SOLOSTAR PEN: 10.0000 [IU] | PEN_INJECTOR | Freq: Every day | SUBCUTANEOUS | 11 refills | Status: AC

## 2023-03-31 NOTE — Patient Instructions (Signed)
 Prescription for Lantus was sent to her pharmacy.  This is a basal insulin.  You can take this at night.

## 2023-03-31 NOTE — Progress Notes (Signed)
 Established Patient Office Visit   Subjective  Patient ID: Samantha Lucas, female    DOB: August 28, 1990  Age: 33 y.o. MRN: [id number]  Chief Complaint  Patient presents with   Follow-up    ED follow-up for [place] BG- 500's patient was given semaglutide     Patient is a 33 year old female seen for ED follow-up.  Patient seen in ED due to hyperglycemia.  Blood sugars at home greater than 500.  Given IVFs and insulin in ED.  Sent home on Ozempic 0.25 mg.  Patient has given herself 1 dose.   Denies any GI issues.  States blood sugar this morning was 276.  Also taking glipizide 10 mg.  Endo referral placed after last OFV, waiting to hear about appt.    Patient Active Problem List   Diagnosis Date Noted   Vitamin D deficiency 12/27/2019   Diabetes mellitus (HCC) 11/27/2019   Routine general medical examination at a health care facility 11/14/2013   Microcytic anemia 07/17/2013   Morbid obesity (HCC) 03/23/2011   Past Medical History:  Diagnosis Date   Allergy    Back pain    Diabetes mellitus without complication (HCC) 05/2012   Scoliosis (and kyphoscoliosis), idiopathic    Viral cardiomyopathy (HCC) 2016   Past Surgical History:  Procedure Laterality Date   WISDOM TOOTH EXTRACTION  dec 2012   Social History   Tobacco Use   Smoking status: Never   Smokeless tobacco: Never  Vaping Use   Vaping status: Never Used  Substance Use Topics   Alcohol use: Yes    Comment: RARELY   Drug use: No   Family History  Problem Relation Age of Onset   Hypertension Mother    Cancer Mother    Obesity Mother    Stroke Father    Hypertension Father    Diabetes Father    Cancer Paternal Grandmother        lung and breast cancer   Hypertension Paternal Grandmother    Allergies  Allergen Reactions   Metformin And Related     GI upset    Other     [place]       ROS Negative unless stated above    Objective:     BP 124/80 (BP Location: Right Arm, Patient Position: Sitting, Cuff  Size: Large)   Pulse 73   Temp 99.1 F (37.3 C) (Oral)   Ht 5\' 10"  (1.778 m)   Wt 249 lb 3.2 oz (113 kg)   LMP 02/23/2023 (Approximate)   SpO2 96%   BMI 35.76 kg/m  BP Readings from Last 3 Encounters:  03/31/23 124/80  03/26/23 (!) 123/90  02/12/23 126/78   Wt Readings from Last 3 Encounters:  03/31/23 249 lb 3.2 oz (113 kg)  03/26/23 244 lb (110.7 kg)  02/12/23 247 lb 3.2 oz (112.1 kg)     Physical Exam Constitutional:      General: She is not in acute distress.    Appearance: Normal appearance.  HENT:     Head: Normocephalic and atraumatic.     Nose: Nose normal.     Mouth/Throat:     Mouth: Mucous membranes are moist.  Cardiovascular:     Rate and Rhythm: Normal rate and regular rhythm.     Heart sounds: Normal heart sounds. No murmur heard.    No gallop.  Pulmonary:     Effort: Pulmonary effort is normal. No respiratory distress.     Breath sounds: Normal breath sounds. No wheezing,  rhonchi or rales.  Skin:    General: Skin is warm and dry.  Neurological:     Mental Status: She is alert and oriented to person, place, and time.     No results found for any visits on 03/31/23.    Assessment & Plan:  Uncontrolled diabetes mellitus with hyperglycemia, without long-term current use of insulin (HCC) -     insulin glargine; Inject 10 Units into the skin at bedtime.  Dispense: 15 mL; Refill: 11  Class 2 severe obesity with serious comorbidity and body mass index (BMI) of 35.0 to 35.9 in adult, unspecified obesity type (HCC) Body mass index is 35.76 kg/m.  Patient with hyperglycemia/uncontrolled blood sugar.  Discontinue Trulicity 3 mg weekly.  Continue Ozempic 0.25 mg weekly, glipizide 10 mg twice daily.  Discussed plans to increase Ozempic dose in 3 weeks to 0.5 mg.  Will start Lantus 10 units at bedtime.  Monitor bs daily.  Awaiting info regarding Endo appt.  Eye exam and foot exam up to date.  Lifestyle modifications encouraged.  Given strict  precautions.  Return in about 3 weeks (around 04/21/2023) for diabetes.   Deeann Saint, MD

## 2023-05-03 ENCOUNTER — Encounter: Payer: Self-pay | Admitting: Family Medicine

## 2023-05-13 ENCOUNTER — Ambulatory Visit: Payer: 59 | Admitting: Family Medicine

## 2023-05-13 ENCOUNTER — Encounter: Payer: Self-pay | Admitting: Family Medicine

## 2023-05-13 ENCOUNTER — Encounter (HOSPITAL_COMMUNITY): Payer: Self-pay

## 2023-05-13 ENCOUNTER — Ambulatory Visit (HOSPITAL_COMMUNITY): Payer: 59

## 2023-05-13 ENCOUNTER — Encounter: Payer: 59 | Admitting: Vascular Surgery

## 2023-05-13 VITALS — BP 122/78 | HR 85 | Temp 98.9°F | Wt 252.0 lb

## 2023-05-13 DIAGNOSIS — E1165 Type 2 diabetes mellitus with hyperglycemia: Secondary | ICD-10-CM | POA: Diagnosis not present

## 2023-05-13 DIAGNOSIS — Z6836 Body mass index (BMI) 36.0-36.9, adult: Secondary | ICD-10-CM | POA: Diagnosis not present

## 2023-05-13 DIAGNOSIS — E66812 Obesity, class 2: Secondary | ICD-10-CM | POA: Diagnosis not present

## 2023-05-13 DIAGNOSIS — Z7985 Long-term (current) use of injectable non-insulin antidiabetic drugs: Secondary | ICD-10-CM

## 2023-05-13 LAB — POCT GLYCOSYLATED HEMOGLOBIN (HGB A1C): Hemoglobin A1C: 10 % — AB (ref 4.0–5.6)

## 2023-05-13 MED ORDER — SEMAGLUTIDE(0.25 OR 0.5MG/DOS) 2 MG/3ML ~~LOC~~ SOPN: 0.5000 mg | PEN_INJECTOR | SUBCUTANEOUS | 1 refills | Status: DC

## 2023-05-13 NOTE — Patient Instructions (Addendum)
 Marland Kitchen

## 2023-05-13 NOTE — Progress Notes (Signed)
 Established Patient Office Visit   Subjective  Patient ID: Samantha Lucas, female    DOB: October 07, 1990  Age: 33 y.o. MRN: [id number]  Chief Complaint  Patient presents with   Follow-up    Patient is a 33 year old female seen for follow-up on diabetes.  Patient states she has been working on changes to diet such as decreasing carb intake, increasing vegetables, and being more consistent.  Patient is also exercising at the gym and by walking.  Patient states she actually feels good when working out.  States blood sugar though still elevated has been in the upper 100s for the first time in a while.  Pt has an appointment with endocrinology next week.    Patient Active Problem List   Diagnosis Date Noted   Vitamin D deficiency 12/27/2019   Diabetes mellitus (HCC) 11/27/2019   Routine general medical examination at a health care facility 11/14/2013   Microcytic anemia 07/17/2013   Morbid obesity (HCC) 03/23/2011   Past Medical History:  Diagnosis Date   Allergy    Back pain    Diabetes mellitus without complication (HCC) 05/2012   Scoliosis (and kyphoscoliosis), idiopathic    Viral cardiomyopathy (HCC) 2016   Past Surgical History:  Procedure Laterality Date   WISDOM TOOTH EXTRACTION  dec 2012   Social History   Tobacco Use   Smoking status: Never   Smokeless tobacco: Never  Vaping Use   Vaping status: Never Used  Substance Use Topics   Alcohol use: Yes    Comment: RARELY   Drug use: No   Family History  Problem Relation Age of Onset   Hypertension Mother    Cancer Mother    Obesity Mother    Stroke Father    Hypertension Father    Diabetes Father    Cancer Paternal Grandmother        lung and breast cancer   Hypertension Paternal Grandmother    Allergies  Allergen Reactions   Metformin And Related     GI upset    Other     [place]       ROS Negative unless stated above    Objective:     BP 122/78 (BP Location: Right Arm, Patient Position: Sitting,  Cuff Size: Large)   Pulse 85   Temp 98.9 F (37.2 C) (Oral)   Wt 252 lb (114.3 kg)   SpO2 99%   BMI 36.16 kg/m  BP Readings from Last 3 Encounters:  05/13/23 122/78  03/31/23 124/80  03/26/23 (!) 123/90   Wt Readings from Last 3 Encounters:  05/13/23 252 lb (114.3 kg)  03/31/23 249 lb 3.2 oz (113 kg)  03/26/23 244 lb (110.7 kg)      Physical Exam Constitutional:      Appearance: Normal appearance.  HENT:     Head: Normocephalic and atraumatic.     Nose: Nose normal.     Mouth/Throat:     Mouth: Mucous membranes are moist.  Eyes:     Extraocular Movements: Extraocular movements intact.  Cardiovascular:     Rate and Rhythm: Normal rate.  Pulmonary:     Effort: Pulmonary effort is normal.  Skin:    General: Skin is warm and dry.  Neurological:     Mental Status: She is alert and oriented to person, place, and time. Mental status is at baseline.      Results for orders placed or performed in visit on 05/13/23  POC HgB A1c  Result Value Ref Range  Hemoglobin A1C 10.0 (A) 4.0 - 5.6 %   HbA1c POC (<> result, manual entry)     HbA1c, POC (prediabetic range)     HbA1c, POC (controlled diabetic range)        Assessment & Plan:  Uncontrolled diabetes mellitus with hyperglycemia, without long-term current use of insulin (HCC) -     POCT glycosylated hemoglobin (Hb A1C) -     Semaglutide(0.25 or 0.5MG /DOS); Inject 0.5 mg into the skin every 7 (seven) days.  Dispense: 3 mL; Refill: 1  Class 2 severe obesity with serious comorbidity and body mass index (BMI) of 36.0 to 36.9 in adult, unspecified obesity type (HCC) -Body mass index is 36.16 kg/m.  Seen for follow-up on DM, uncontrolled.  A1c 12.6% on 02/12/2023.  A1c this visit 10%.  Patient congratulated on lifestyle modifications and encouraged to continue.  Continue current medication glipizide 10 mg twice daily, Lantus 10 units nightly.  Semaglutide dose increased to 0.5 mg weekly.  Keep upcoming appointment with  endocrinology.  Return in about 4 months (around 09/12/2023).,sooner if needed.   Viola Greulich, MD

## 2023-05-17 ENCOUNTER — Other Ambulatory Visit

## 2023-05-20 ENCOUNTER — Encounter: Payer: Self-pay | Admitting: Radiology

## 2023-05-20 ENCOUNTER — Other Ambulatory Visit: Payer: Self-pay | Admitting: "Endocrinology

## 2023-05-20 ENCOUNTER — Encounter: Payer: Self-pay | Admitting: "Endocrinology

## 2023-05-20 ENCOUNTER — Ambulatory Visit: Admitting: "Endocrinology

## 2023-05-20 VITALS — BP 122/80 | HR 73 | Ht 70.0 in | Wt 253.0 lb

## 2023-05-20 DIAGNOSIS — Z7984 Long term (current) use of oral hypoglycemic drugs: Secondary | ICD-10-CM | POA: Diagnosis not present

## 2023-05-20 DIAGNOSIS — E1165 Type 2 diabetes mellitus with hyperglycemia: Secondary | ICD-10-CM | POA: Diagnosis not present

## 2023-05-20 DIAGNOSIS — Z794 Long term (current) use of insulin: Secondary | ICD-10-CM | POA: Diagnosis not present

## 2023-05-20 DIAGNOSIS — E7800 Pure hypercholesterolemia, unspecified: Secondary | ICD-10-CM

## 2023-05-20 DIAGNOSIS — Z7985 Long-term (current) use of injectable non-insulin antidiabetic drugs: Secondary | ICD-10-CM

## 2023-05-20 MED ORDER — BAQSIMI ONE PACK 3 MG/DOSE NA POWD: 1.0000 | NASAL | 3 refills | Status: AC | PRN

## 2023-05-20 MED ORDER — FREESTYLE LIBRE 3 PLUS SENSOR MISC
3 refills | Status: AC
Start: 2023-05-20 — End: ?

## 2023-05-20 MED ORDER — SEMAGLUTIDE (1 MG/DOSE) 4 MG/3ML ~~LOC~~ SOPN: 1.0000 mg | PEN_INJECTOR | SUBCUTANEOUS | 3 refills | Status: DC

## 2023-05-20 NOTE — Progress Notes (Signed)
 Outpatient Endocrinology Note Samantha Newcomer, Samantha Lucas  05/20/23   Samantha Lucas [place] 1990-08-17 [id number]  Referring Provider: Viola Greulich, Samantha Lucas Primary Care Provider: Viola Greulich, Samantha Lucas Reason for consultation: Subjective   Assessment & Plan  Diagnoses and all orders for this visit:  Uncontrolled type 2 diabetes mellitus with hyperglycemia (HCC) -     Ambulatory referral to diabetic education -     Lipid panel; Future  Long term (current) use of oral hypoglycemic drugs  Long-term (current) use of injectable non-insulin  antidiabetic drugs  Long-term insulin  use (HCC)  Pure hypercholesterolemia  Other orders -     Semaglutide , 1 MG/DOSE, 4 MG/3ML SOPN; Inject 1 mg as directed once a week. -     Continuous Glucose Sensor (FREESTYLE LIBRE 3 PLUS SENSOR) MISC; Change sensor every 15 days. -     Glucagon  (BAQSIMI  ONE PACK) 3 MG/DOSE POWD; Place 1 Device into the nose as needed (Low blood sugar with impaired consciousness).   Diabetes Type II complicated by neuropathy,  Lab Results  Component Value Date   GFR 120.33 03/07/2021   Hba1c goal less than 7, current Hba1c is  Lab Results  Component Value Date   HGBA1C 10.0 (A) 05/13/2023   Will recommend the following: Glipizide  10 mg bid Lantus  15 units at bedtime Ozempic  0.5 mg/week Ordered libre 3+ Ordered DM education  No known contraindications/side effects to any of above medications Glucagon  discussed and prescribed with refills on 05/20/23    -Last LD and Tg are as follows: Lab Results  Component Value Date   LDLCALC 116 (H) 06/20/2021    Lab Results  Component Value Date   TRIG 114.0 06/20/2021   -not on statin  -Follow low fat diet and exercise   -Blood pressure goal <140/90 - Microalbumin/creatinine goal is < 30 -Last MA/Cr is as follows: Lab Results  Component Value Date   MICROALBUR <0.7 06/20/2021   -not on ACE/ARB  -diet changes including salt restriction -limit eating outside -counseled  BP targets per standards of diabetes care -uncontrolled blood pressure can lead to retinopathy, nephropathy and cardiovascular and atherosclerotic heart disease  Reviewed and counseled on: -A1C target -Blood sugar targets -Complications of uncontrolled diabetes  -Checking blood sugar before meals and bedtime and bring log next visit -All medications with mechanism of action and side effects -Hypoglycemia management: rule of 15's, Glucagon  Emergency Kit and medical alert ID -low-carb low-fat plate-method diet -At least 20 minutes of physical activity per day -Annual dilated retinal eye exam and foot exam -compliance and follow up needs -follow up as scheduled or earlier if problem gets worse  Call if blood sugar is less than 70 or consistently above 250    Take a 15 gm snack of carbohydrate at bedtime before you go to sleep if your blood sugar is less than 100.    If you are going to fast after midnight for a test or procedure, ask your physician for instructions on how to reduce/decrease your insulin  dose.    Call if blood sugar is less than 70 or consistently above 250  -Treating a low sugar by rule of 15  (15 gms of sugar every 15 min until sugar is more than 70) If you feel your sugar is low, test your sugar to be sure If your sugar is low (less than 70), then take 15 grams of a fast acting Carbohydrate (3-4 glucose tablets or glucose gel or 4 ounces of juice or regular soda) Recheck your  sugar 15 min after treating low to make sure it is more than 70 If sugar is still less than 70, treat again with 15 grams of carbohydrate          Don't drive the hour of hypoglycemia  If unconscious/unable to eat or drink by mouth, use glucagon  injection or nasal spray baqsimi  and call 911. Can repeat again in 15 min if still unconscious.  Return in about 3 weeks (around 06/10/2023).   I have reviewed current medications, nurse's notes, allergies, vital signs, past medical and surgical history,  family medical history, and social history for this encounter. Counseled patient on symptoms, examination findings, lab findings, imaging results, treatment decisions and monitoring and prognosis. The patient understood the recommendations and agrees with the treatment plan. All questions regarding treatment plan were fully answered.  Samantha Newcomer, Samantha Lucas  05/20/23    History of Present Illness Samantha Lucas is a 33 y.o. year old female who presents for evaluation of Type II diabetes mellitus.  Samantha Lucas was first diagnosed at age 40. Diabetes education +  Home diabetes regimen: Glipizide  10 mg bid Lantus  10 units at bedtime Ozempic  0.25mg /week  COMPLICATIONS -  MI/Stroke -  retinopathy +  neuropathy -  nephropathy  SYMPTOMS REVIEWED - Polyuria - Weight loss - Blurred vision  BLOOD SUGAR DATA 105-216 fasting  Physical Exam  BP 122/80   Pulse 73   Ht 5\' 10"  (1.778 m)   Wt 253 lb (114.8 kg)   SpO2 99%   BMI 36.30 kg/m    Constitutional: well developed, well nourished Head: normocephalic, atraumatic Eyes: sclera anicteric, no redness Neck: supple Lungs: normal respiratory effort Neurology: alert and oriented Skin: dry, no appreciable rashes Musculoskeletal: no appreciable defects Psychiatric: normal mood and affect Diabetic Foot Exam - Simple   No data filed      Current Medications Patient's Medications  New Prescriptions   CONTINUOUS GLUCOSE SENSOR (FREESTYLE LIBRE 3 PLUS SENSOR) MISC    Change sensor every 15 days.   GLUCAGON  (BAQSIMI  ONE PACK) 3 MG/DOSE POWD    Place 1 Device into the nose as needed (Low blood sugar with impaired consciousness).   SEMAGLUTIDE , 1 MG/DOSE, 4 MG/3ML SOPN    Inject 1 mg as directed once a week.  Previous Medications   ALBUTEROL  (VENTOLIN  HFA) 108 (90 BASE) MCG/ACT INHALER    TAKE 2 PUFFS BY MOUTH EVERY 6 HOURS AS NEEDED FOR WHEEZE OR SHORTNESS OF BREATH   CHOLECALCIFEROL (D3 PO)    Take 1 tablet by mouth daily.    CYANOCOBALAMIN (B-12 PO)    Take 1 tablet by mouth daily.   GLIPIZIDE  (GLUCOTROL ) 10 MG TABLET    TAKE 1 TABLET BY MOUTH TWICE A DAY BEFORE A MEAL   INSULIN  GLARGINE (LANTUS ) 100 UNIT/ML SOPN    Inject 10 Units into the skin at bedtime.   MAGNESIUM PO    Take 1 tablet by mouth daily as needed (Supplement).   MULTIPLE VITAMIN (MULITIVITAMIN WITH MINERALS) TABS    Take 1 tablet by mouth daily.   NITROFURANTOIN , MACROCRYSTAL-MONOHYDRATE, (MACROBID ) 100 MG CAPSULE    Take 1 capsule (100 mg total) by mouth 2 (two) times daily.   ZINC SULFATE (ZINC-220 PO)    Take 1 tablet by mouth daily.  Modified Medications   No medications on file  Discontinued Medications   SEMAGLUTIDE ,0.25 OR 0.5MG /DOS, 2 MG/3ML SOPN    Inject 0.5 mg into the skin every 7 (seven) days.    Allergies  Allergies  Allergen Reactions   Metformin  And Related     GI upset    Other     [place]     Past Medical History Past Medical History:  Diagnosis Date   Allergy    Back pain    Diabetes mellitus without complication (HCC) 05/2012   Scoliosis (and kyphoscoliosis), idiopathic    Viral cardiomyopathy (HCC) 2016    Past Surgical History Past Surgical History:  Procedure Laterality Date   WISDOM TOOTH EXTRACTION  dec 2012    Family History family history includes Cancer in her mother and paternal grandmother; Diabetes in her father; Hypertension in her father, mother, and paternal grandmother; Obesity in her mother; Stroke in her father.  Social History Social History   Socioeconomic History   Marital status: Single    Spouse name: Not on file   Number of children: 0   Years of education: Not on file   Highest education level: Some college, no degree  Occupational History   Occupation: phlebotomist  Tobacco Use   Smoking status: Never   Smokeless tobacco: Never  Vaping Use   Vaping status: Never Used  Substance and Sexual Activity   Alcohol use: Yes    Comment: RARELY   Drug use: No   Sexual  activity: Yes    Birth control/protection: None  Other Topics Concern   Not on file  Social History Narrative   Not on file   Social Drivers of Health   Financial Resource Strain: Low Risk  (05/12/2023)   Overall Financial Resource Strain (CARDIA)    Difficulty of Paying Living Expenses: Not very hard  Food Insecurity: No Food Insecurity (05/12/2023)   Hunger Vital Sign    Worried About Running Out of Food in the Last Year: Never true    Ran Out of Food in the Last Year: Never true  Transportation Needs: No Transportation Needs (05/12/2023)   PRAPARE - Administrator, Civil Service (Medical): No    Lack of Transportation (Non-Medical): No  Physical Activity: Sufficiently Active (05/12/2023)   Exercise Vital Sign    Days of Exercise per Week: 3 days    Minutes of Exercise per Session: 60 min  Stress: No Stress Concern Present (05/12/2023)   [employer] of Occupational Health - Occupational Stress Questionnaire    Feeling of Stress : Not at all  Social Connections: Moderately Integrated (05/12/2023)   Social Connection and Isolation Panel [NHANES]    Frequency of Communication with Friends and Family: More than three times a week    Frequency of Social Gatherings with Friends and Family: Once a week    Attends Religious Services: More than 4 times per year    Active Member of Clubs or Organizations: Yes    Attends Banker Meetings: More than 4 times per year    Marital Status: Never married  Intimate Partner Violence: Not on file    Lab Results  Component Value Date   HGBA1C 10.0 (A) 05/13/2023   HGBA1C 12.6 (H) 02/12/2023   HGBA1C 8.9 07/06/2022   Lab Results  Component Value Date   CHOL 174 06/20/2021   Lab Results  Component Value Date   HDL 35.00 (L) 06/20/2021   Lab Results  Component Value Date   LDLCALC 116 (H) 06/20/2021   Lab Results  Component Value Date   TRIG 114.0 06/20/2021   Lab Results  Component Value Date   CHOLHDL  5 06/20/2021   Lab Results  Component Value Date   CREATININE 0.56 03/26/2023   Lab Results  Component Value Date   GFR 120.33 03/07/2021   Lab Results  Component Value Date   MICROALBUR <0.7 06/20/2021      Component Value Date/Time   NA 136 03/26/2023 1117   NA 138 02/12/2023 1119   K 3.7 03/26/2023 1117   CL 101 03/26/2023 1104   CO2 23 03/26/2023 1104   GLUCOSE 330 (H) 03/26/2023 1104   BUN 9 03/26/2023 1104   BUN 9 02/12/2023 1119   CREATININE 0.56 03/26/2023 1104   CREATININE 0.60 10/04/2019 1204   CALCIUM 9.0 03/26/2023 1104   PROT 7.2 03/26/2023 1104   PROT 7.1 02/12/2023 1119   ALBUMIN 3.2 (L) 03/26/2023 1104   ALBUMIN 4.2 02/12/2023 1119   AST 17 03/26/2023 1104   ALT 19 03/26/2023 1104   ALKPHOS 109 03/26/2023 1104   BILITOT 0.5 03/26/2023 1104   BILITOT 0.4 02/12/2023 1119   GFRNONAA >60 03/26/2023 1104   GFRNONAA 124 10/05/2017 1204   GFRAA 144 01/16/2020 0903   GFRAA 143 10/05/2017 1204      Latest Ref Rng & Units 03/26/2023   11:17 AM 03/26/2023   11:04 AM 03/26/2023    9:19 AM  BMP  Glucose 70 - 99 mg/dL  191  478   BUN 6 - 20 mg/dL  9  11   Creatinine 2.95 - 1.00 mg/dL  6.21  3.08   Sodium 657 - 145 mmol/L 136  133  134   Potassium 3.5 - 5.1 mmol/L 3.7  3.7  4.5   Chloride 98 - 111 mmol/L  101  103   CO2 22 - 32 mmol/L  23    Calcium 8.9 - 10.3 mg/dL  9.0         Component Value Date/Time   WBC 9.9 03/26/2023 0912   RBC 4.80 03/26/2023 0912   HGB 14.3 03/26/2023 1117   HCT 42.0 03/26/2023 1117   PLT 322 03/26/2023 0912   MCV 84.0 03/26/2023 0912   MCH 26.7 03/26/2023 0912   MCHC 31.8 03/26/2023 0912   RDW 14.1 03/26/2023 0912   LYMPHSABS 3,211 10/04/2019 1204   MONOABS 728 02/07/2016 1032   EOSABS 152 10/04/2019 1204   BASOSABS 29 10/04/2019 1204     Parts of this note may have been dictated using voice recognition software. There may be variances in spelling and vocabulary which are unintentional. Not all errors are proofread.  Please notify the [facility] if any discrepancies are noted or if the meaning of any statement is not clear.

## 2023-05-20 NOTE — Patient Instructions (Signed)

## 2023-05-21 LAB — LIPID PANEL
Chol/HDL Ratio: 4.5 ratio — ABNORMAL HIGH (ref 0.0–4.4)
Cholesterol, Total: 181 mg/dL (ref 100–199)
HDL: 40 mg/dL (ref >39)
LDL Chol Calc (NIH): 122 mg/dL — ABNORMAL HIGH (ref 0–99)
Triglycerides: 105 mg/dL (ref 0–149)
VLDL Cholesterol Cal: 19 mg/dL (ref 5–40)

## 2023-06-10 ENCOUNTER — Ambulatory Visit: Admitting: "Endocrinology

## 2023-07-01 ENCOUNTER — Ambulatory Visit: Admitting: "Endocrinology

## 2023-07-01 ENCOUNTER — Encounter: Payer: Self-pay | Admitting: "Endocrinology

## 2023-07-01 VITALS — BP 110/80 | HR 85 | Ht 70.0 in | Wt 249.0 lb

## 2023-07-01 DIAGNOSIS — Z7984 Long term (current) use of oral hypoglycemic drugs: Secondary | ICD-10-CM

## 2023-07-01 DIAGNOSIS — E1165 Type 2 diabetes mellitus with hyperglycemia: Secondary | ICD-10-CM

## 2023-07-01 DIAGNOSIS — E7800 Pure hypercholesterolemia, unspecified: Secondary | ICD-10-CM

## 2023-07-01 DIAGNOSIS — Z794 Long term (current) use of insulin: Secondary | ICD-10-CM

## 2023-07-01 DIAGNOSIS — Z7985 Long-term (current) use of injectable non-insulin antidiabetic drugs: Secondary | ICD-10-CM

## 2023-07-01 MED ORDER — METFORMIN HCL ER 500 MG PO TB24: 1000.0000 mg | ORAL_TABLET | Freq: Two times a day (BID) | ORAL | 3 refills | Status: DC

## 2023-07-01 MED ORDER — SEMAGLUTIDE (2 MG/DOSE) 8 MG/3ML ~~LOC~~ SOPN
2.0000 mg | PEN_INJECTOR | SUBCUTANEOUS | 1 refills | Status: AC
Start: 2023-07-01 — End: ?

## 2023-07-01 NOTE — Progress Notes (Signed)
 Outpatient Endocrinology Note Jorge Newcomer, MD  07/01/23   Lylee Corrow Dano 1990/12/08 [id number]  Referring Provider: Viola Greulich, MD Primary Care Provider: Viola Greulich, MD Reason for consultation: Subjective   Assessment & Plan  Diagnoses and all orders for this visit:  Uncontrolled type 2 diabetes mellitus with hyperglycemia (HCC)  Long term (current) use of oral hypoglycemic drugs  Long-term (current) use of injectable non-insulin  antidiabetic drugs  Long-term insulin  use (HCC)  Pure hypercholesterolemia  Other orders -     Semaglutide , 2 MG/DOSE, 8 MG/3ML SOPN; Inject 2 mg as directed once a week. -     metFORMIN  (GLUCOPHAGE -XR) 500 MG 24 hr tablet; Take 2 tablets (1,000 mg total) by mouth 2 (two) times daily with a meal.    Diabetes Type II complicated by neuropathy,  Lab Results  Component Value Date   GFR 120.33 03/07/2021   Hba1c goal less than 7, current Hba1c is  Lab Results  Component Value Date   HGBA1C 10.0 (A) 05/13/2023   Will recommend the following: Glipizide  10 mg bid Ozempic  2 mg/week We will start you on an extended release version of metformin , which should help with the upset stomach. Start with one 500 mg capsule taken every evening with dinner. Stay on this for 3-5 days, then increase to 1 tablet with breakfast and one with dinner. If you do not have any upset stomach, gradually increase to the goal dose of 2 capsules with breakfast and 2 capsules with dinner. If you do have upset stomach, decrease it back to the previous dose that you tolerated.  Week 1: one tablet after dinner Week 2: one tablet after breakfast and one after dinner  Week 3: one tablet after breakfast and two after dinner  Week 4 and onwards: two tablets after breakfast and two after dinner  (If cannot tolerate metformin  XR, will resume Lantus  10 units at bedtime everyday) Hold insulin /all medication next dose if BG <70  Ordered libre 3+ Ordered DM  education  She did not tolerate regular metformin , patient denies taking  Invokana /jardiance  No known contraindications/side effects to any of above medications Glucagon  discussed and prescribed with refills on 05/20/23    -Last LD and Tg are as follows: Lab Results  Component Value Date   LDLCALC 122 (H) 05/20/2023    Lab Results  Component Value Date   TRIG 105 05/20/2023   -not on statin  -Follow low fat diet and exercise   -Blood pressure goal <140/90 - Microalbumin/creatinine goal is < 30 -Last MA/Cr is as follows: Lab Results  Component Value Date   MICROALBUR <0.7 06/20/2021   -not on ACE/ARB  -diet changes including salt restriction -limit eating outside -counseled BP targets per standards of diabetes care -uncontrolled blood pressure can lead to retinopathy, nephropathy and cardiovascular and atherosclerotic heart disease  Reviewed and counseled on: -A1C target -Blood sugar targets -Complications of uncontrolled diabetes  -Checking blood sugar before meals and bedtime and bring log next visit -All medications with mechanism of action and side effects -Hypoglycemia management: rule of 15's, Glucagon  Emergency Kit and medical alert ID -low-carb low-fat plate-method diet -At least 20 minutes of physical activity per day -Annual dilated retinal eye exam and foot exam -compliance and follow up needs -follow up as scheduled or earlier if problem gets worse  Call if blood sugar is less than 70 or consistently above 250    Take a 15 gm snack of carbohydrate at bedtime before you go  to sleep if your blood sugar is less than 100.    If you are going to fast after midnight for a test or procedure, ask your physician for instructions on how to reduce/decrease your insulin  dose.    Call if blood sugar is less than 70 or consistently above 250  -Treating a low sugar by rule of 15  (15 gms of sugar every 15 min until sugar is more than 70) If you feel your sugar is low,  test your sugar to be sure If your sugar is low (less than 70), then take 15 grams of a fast acting Carbohydrate (3-4 glucose tablets or glucose gel or 4 ounces of juice or regular soda) Recheck your sugar 15 min after treating low to make sure it is more than 70 If sugar is still less than 70, treat again with 15 grams of carbohydrate          Don't drive the hour of hypoglycemia  If unconscious/unable to eat or drink by mouth, use glucagon  injection or nasal spray baqsimi  and call 911. Can repeat again in 15 min if still unconscious.  Return in about 3 months (around 10/01/2023).   I have reviewed current medications, nurse's notes, allergies, vital signs, past medical and surgical history, family medical history, and social history for this encounter. Counseled patient on symptoms, examination findings, lab findings, imaging results, treatment decisions and monitoring and prognosis. The patient understood the recommendations and agrees with the treatment plan. All questions regarding treatment plan were fully answered.  Jorge Newcomer, MD  07/01/23    History of Present Illness Azhane R Basich is a 33 y.o. year old female who presents for follow up of Type II diabetes mellitus.  Joyous R Rodenberg was first diagnosed at age 72. Diabetes education +  Home diabetes regimen: Glipizide  10 mg bid Lantus  10-15 units at bedtime when it "[place]" Ozempic  1 mg/week  COMPLICATIONS -  MI/Stroke -  retinopathy +  neuropathy -  nephropathy  SYMPTOMS REVIEWED - Polyuria - Weight loss - Blurred vision  BLOOD SUGAR DATA 93-293 checked 0-1 times a day  Physical Exam  BP 110/80   Pulse 85   Ht 5\' 10"  (1.778 m)   Wt 249 lb (112.9 kg)   SpO2 98%   BMI 35.73 kg/m    Constitutional: well developed, well nourished Head: normocephalic, atraumatic Eyes: sclera anicteric, no redness Neck: supple Lungs: normal respiratory effort Neurology: alert and oriented Skin: dry, no appreciable  rashes Musculoskeletal: no appreciable defects Psychiatric: normal mood and affect Diabetic Foot Exam - Simple   No data filed      Current Medications Patient's Medications  New Prescriptions   METFORMIN  (GLUCOPHAGE -XR) 500 MG 24 HR TABLET    Take 2 tablets (1,000 mg total) by mouth 2 (two) times daily with a meal.   SEMAGLUTIDE , 2 MG/DOSE, 8 MG/3ML SOPN    Inject 2 mg as directed once a week.  Previous Medications   ALBUTEROL  (VENTOLIN  HFA) 108 (90 BASE) MCG/ACT INHALER    TAKE 2 PUFFS BY MOUTH EVERY 6 HOURS AS NEEDED FOR WHEEZE OR SHORTNESS OF BREATH   CHOLECALCIFEROL (D3 PO)    Take 1 tablet by mouth daily.   CONTINUOUS GLUCOSE SENSOR (FREESTYLE LIBRE 3 PLUS SENSOR) MISC    Change sensor every 15 days.   CYANOCOBALAMIN (B-12 PO)    Take 1 tablet by mouth daily.   GLIPIZIDE  (GLUCOTROL ) 10 MG TABLET    TAKE 1 TABLET BY MOUTH  TWICE A DAY BEFORE A MEAL   GLUCAGON  (BAQSIMI  ONE PACK) 3 MG/DOSE POWD    Place 1 Device into the nose as needed (Low blood sugar with impaired consciousness).   INSULIN  GLARGINE (LANTUS ) 100 UNIT/ML SOPN    Inject 10 Units into the skin at bedtime.   MAGNESIUM PO    Take 1 tablet by mouth daily as needed (Supplement).   MULTIPLE VITAMIN (MULITIVITAMIN WITH MINERALS) TABS    Take 1 tablet by mouth daily.   NITROFURANTOIN , MACROCRYSTAL-MONOHYDRATE, (MACROBID ) 100 MG CAPSULE    Take 1 capsule (100 mg total) by mouth 2 (two) times daily.   ZINC SULFATE (ZINC-220 PO)    Take 1 tablet by mouth daily.  Modified Medications   No medications on file  Discontinued Medications   SEMAGLUTIDE , 1 MG/DOSE, 4 MG/3ML SOPN    Inject 1 mg as directed once a week.    Allergies Allergies  Allergen Reactions   Metformin  And Related     GI upset    Other     [place]     Past Medical History Past Medical History:  Diagnosis Date   Allergy    Back pain    Diabetes mellitus without complication (HCC) 05/2012   Scoliosis (and kyphoscoliosis), idiopathic    Viral  cardiomyopathy (HCC) 2016    Past Surgical History Past Surgical History:  Procedure Laterality Date   WISDOM TOOTH EXTRACTION  dec 2012    Family History family history includes Cancer in her mother and paternal grandmother; Diabetes in her father; Hypertension in her father, mother, and paternal grandmother; Obesity in her mother; Stroke in her father.  Social History Social History   Socioeconomic History   Marital status: Single    Spouse name: Not on file   Number of children: 0   Years of education: Not on file   Highest education level: Some college, no degree  Occupational History   Occupation: phlebotomist  Tobacco Use   Smoking status: Never   Smokeless tobacco: Never  Vaping Use   Vaping status: Never Used  Substance and Sexual Activity   Alcohol use: Yes    Comment: RARELY   Drug use: No   Sexual activity: Yes    Birth control/protection: None  Other Topics Concern   Not on file  Social History Narrative   Not on file   Social Drivers of Health   Financial Resource Strain: Low Risk  (05/12/2023)   Overall Financial Resource Strain (CARDIA)    Difficulty of Paying Living Expenses: Not very hard  Food Insecurity: No Food Insecurity (05/12/2023)   Hunger Vital Sign    Worried About Running Out of Food in the Last Year: Never true    Ran Out of Food in the Last Year: Never true  Transportation Needs: No Transportation Needs (05/12/2023)   PRAPARE - Administrator, Civil Service (Medical): No    Lack of Transportation (Non-Medical): No  Physical Activity: Sufficiently Active (05/12/2023)   Exercise Vital Sign    Days of Exercise per Week: 3 days    Minutes of Exercise per Session: 60 min  Stress: No Stress Concern Present (05/12/2023)   [employer] of Occupational Health - Occupational Stress Questionnaire    Feeling of Stress : Not at all  Social Connections: Moderately Integrated (05/12/2023)   Social Connection and Isolation Panel  [NHANES]    Frequency of Communication with Friends and Family: More than three times a week    Frequency of  Social Gatherings with Friends and Family: Once a week    Attends Religious Services: More than 4 times per year    Active Member of Clubs or Organizations: Yes    Attends Banker Meetings: More than 4 times per year    Marital Status: Never married  Intimate Partner Violence: Not on file    Lab Results  Component Value Date   HGBA1C 10.0 (A) 05/13/2023   HGBA1C 12.6 (H) 02/12/2023   HGBA1C 8.9 07/06/2022   Lab Results  Component Value Date   CHOL 181 05/20/2023   Lab Results  Component Value Date   HDL 40 05/20/2023   Lab Results  Component Value Date   LDLCALC 122 (H) 05/20/2023   Lab Results  Component Value Date   TRIG 105 05/20/2023   Lab Results  Component Value Date   CHOLHDL 4.5 (H) 05/20/2023   Lab Results  Component Value Date   CREATININE 0.56 03/26/2023   Lab Results  Component Value Date   GFR 120.33 03/07/2021   Lab Results  Component Value Date   MICROALBUR <0.7 06/20/2021      Component Value Date/Time   NA 136 03/26/2023 1117   NA 138 02/12/2023 1119   K 3.7 03/26/2023 1117   CL 101 03/26/2023 1104   CO2 23 03/26/2023 1104   GLUCOSE 330 (H) 03/26/2023 1104   BUN 9 03/26/2023 1104   BUN 9 02/12/2023 1119   CREATININE 0.56 03/26/2023 1104   CREATININE 0.60 10/04/2019 1204   CALCIUM 9.0 03/26/2023 1104   PROT 7.2 03/26/2023 1104   PROT 7.1 02/12/2023 1119   ALBUMIN 3.2 (L) 03/26/2023 1104   ALBUMIN 4.2 02/12/2023 1119   AST 17 03/26/2023 1104   ALT 19 03/26/2023 1104   ALKPHOS 109 03/26/2023 1104   BILITOT 0.5 03/26/2023 1104   BILITOT 0.4 02/12/2023 1119   GFRNONAA >60 03/26/2023 1104   GFRNONAA 124 10/05/2017 1204   GFRAA 144 01/16/2020 0903   GFRAA 143 10/05/2017 1204      Latest Ref Rng & Units 03/26/2023   11:17 AM 03/26/2023   11:04 AM 03/26/2023    9:19 AM  BMP  Glucose 70 - 99 mg/dL  045  409    BUN 6 - 20 mg/dL  9  11   Creatinine 8.11 - 1.00 mg/dL  9.14  7.82   Sodium 956 - 145 mmol/L 136  133  134   Potassium 3.5 - 5.1 mmol/L 3.7  3.7  4.5   Chloride 98 - 111 mmol/L  101  103   CO2 22 - 32 mmol/L  23    Calcium 8.9 - 10.3 mg/dL  9.0         Component Value Date/Time   WBC 9.9 03/26/2023 0912   RBC 4.80 03/26/2023 0912   HGB 14.3 03/26/2023 1117   HCT 42.0 03/26/2023 1117   PLT 322 03/26/2023 0912   MCV 84.0 03/26/2023 0912   MCH 26.7 03/26/2023 0912   MCHC 31.8 03/26/2023 0912   RDW 14.1 03/26/2023 0912   LYMPHSABS 3,211 10/04/2019 1204   MONOABS 728 02/07/2016 1032   EOSABS 152 10/04/2019 1204   BASOSABS 29 10/04/2019 1204     Parts of this note may have been dictated using voice recognition software. There may be variances in spelling and vocabulary which are unintentional. Not all errors are proofread. Please notify the [facility] if any discrepancies are noted or if the meaning of any statement is not clear.

## 2023-07-01 NOTE — Patient Instructions (Signed)
We will start you on an extended release version of metformin, which should help with the upset stomach. Start with one 500 mg capsule taken every evening with dinner. Stay on this for 3-5 days, then increase to 1 tablet with breakfast and one with dinner. If you do not have any upset stomach, gradually increase to the goal dose of 2 capsules with breakfast and 2 capsules with dinner. If you do have upset stomach, decrease it back to the previous dose that you tolerated.  Week 1: one tablet after dinner Week 2: one tablet after breakfast and one after dinner  Week 3: one tablet after breakfast and two after dinner  Week 4 and onwards: two tablets after breakfast and two after dinner

## 2023-10-01 ENCOUNTER — Encounter: Payer: Self-pay | Admitting: "Endocrinology

## 2023-10-01 ENCOUNTER — Ambulatory Visit: Admitting: "Endocrinology

## 2023-10-01 VITALS — BP 120/80 | HR 81 | Ht 70.0 in | Wt 247.8 lb

## 2023-10-01 DIAGNOSIS — E11649 Type 2 diabetes mellitus with hypoglycemia without coma: Secondary | ICD-10-CM | POA: Diagnosis not present

## 2023-10-01 DIAGNOSIS — Z7985 Long-term (current) use of injectable non-insulin antidiabetic drugs: Secondary | ICD-10-CM

## 2023-10-01 DIAGNOSIS — Z7984 Long term (current) use of oral hypoglycemic drugs: Secondary | ICD-10-CM | POA: Diagnosis not present

## 2023-10-01 DIAGNOSIS — Z794 Long term (current) use of insulin: Secondary | ICD-10-CM

## 2023-10-01 DIAGNOSIS — E7800 Pure hypercholesterolemia, unspecified: Secondary | ICD-10-CM

## 2023-10-01 MED ORDER — GLIPIZIDE 5 MG PO TABS: 5.0000 mg | ORAL_TABLET | ORAL | 3 refills | Status: DC | PRN

## 2023-10-01 NOTE — Patient Instructions (Addendum)
 Will recommend the following: Ozempic  2 mg/week (patient would like to stick to the dose) Metformin  XR 500 mg bid, escalate to 1 pill in the morning and 2 at night if well-tolerated.  If not, go back down to the original dose PRN Glipizide  5 mg for blood sugar more than 200   Stop Glipizide  10 mg bid  __________   Goals of DM therapy:  Morning Fasting blood sugar: 80-140  Blood sugar before meals: 80-140 Bed time blood sugar: 100-150  A1C <7%, limited only by hypoglycemia  1.Diabetes medications and their side effects discussed, including hypoglycemia    2. Check blood glucose:  a) Always check blood sugars before driving. Please see below (under hypoglycemia) on how to manage b) Check a minimum of 3 times/day or more as needed when having symptoms of hypoglycemia.   c) Try to check blood glucose before sleeping/in the middle of the night to ensure that it is remaining stable and not dropping less than 100 d) Check blood glucose more often if sick  3. Diet: a) 3 meals per day schedule b: Restrict carbs to 60-70 grams (4 servings) per meal c) Colorful vegetables - 3 servings a day, and low sugar fruit 2 servings/day Plate control method: 1/4 plate protein, 1/4 starch, 1/2 green, yellow, or red vegetables d) Avoid carbohydrate snacks unless hypoglycemic episode, or increased physical activity  4. Regular exercise as tolerated, preferably 3 or more hours a week  5. Hypoglycemia: a)  Do not drive or operate machinery without first testing blood glucose to assure it is over 90 mg%, or if dizzy, lightheaded, not feeling normal, etc, or  if foot or leg is numb or weak. b)  If blood glucose less than 70, take four 5gm Glucose tabs or 15-30 gm Glucose gel.  Repeat every 15 min as needed until blood sugar is >100 mg/dl. If hypoglycemia persists then call 911.   6. Sick day management: a) Check blood glucose more often b) Continue usual therapy if blood sugars are elevated.   7.  Contact the doctor immediately if blood glucose is frequently <60 mg/dl, or an episode of severe hypoglycemia occurs (where someone had to give you glucose/  glucagon  or if you passed out from a low blood glucose), or if blood glucose is persistently >350 mg/dl, for further management  8. A change in level of physical activity or exercise and a change in diet may also affect your blood sugar. Check blood sugars more often and call if needed.  Instructions: 1. Bring glucose meter, blood glucose records on every visit for review 2. Continue to follow up with primary care physician and other providers for medical care 3. Yearly eye  and foot exam 4. Please get blood work done prior to the next appointment

## 2023-10-01 NOTE — Progress Notes (Signed)
 Outpatient Endocrinology Note Obadiah Birmingham, MD  10/01/23   Samantha Lucas 1990/07/07 [id number]  Referring Provider: Mercer Clotilda SAUNDERS, MD Primary Care Provider: Mercer Clotilda SAUNDERS, MD Reason for consultation: Subjective   Assessment & Plan  Diagnoses and all orders for this visit:  Uncontrolled type 2 diabetes mellitus with hypoglycemia without coma (HCC) -     POCT glycosylated hemoglobin (Hb A1C) -     glipiZIDE  (GLUCOTROL ) 5 MG tablet; Take 1 tablet (5 mg total) by mouth as needed (for blood sugar more than 200).  Uncontrolled type 2 diabetes mellitus with hyperglycemia (HCC)  Long term (current) use of oral hypoglycemic drugs  Long-term (current) use of injectable non-insulin  antidiabetic drugs  Long-term insulin  use (HCC)  Pure hypercholesterolemia   Diabetes Type II complicated by neuropathy,  Lab Results  Component Value Date   GFR 120.33 03/07/2021   Hba1c goal less than 7, current Hba1c is  Lab Results  Component Value Date   HGBA1C 10.0 (A) 05/13/2023   Will recommend the following: Ozempic  2 mg/week (patient would like to stick to the dose) Metformin  XR 500 mg bid, escalated to 1 pill in the morning and 2 at night if well-tolerated.  If not, go back down to the original dose PRN Glipizide  5 mg for blood sugar more than 200   Stop Glipizide  10 mg bid  Hold insulin /all medication next dose if BG <70  Ordered libre 3+ Ordered DM education  She did not tolerate regular metformin , patient denies taking  Invokana /jardiance  No known contraindications/side effects to any of above medications Glucagon  discussed and prescribed with refills on 05/20/23    -Last LD and Tg are as follows: Lab Results  Component Value Date   LDLCALC 122 (H) 05/20/2023    Lab Results  Component Value Date   TRIG 105 05/20/2023   -not on statin  -Follow low fat diet and exercise   -Blood pressure goal <140/90 - Microalbumin/creatinine goal is < 30 -Last MA/Cr is as  follows: Lab Results  Component Value Date   MICROALBUR <0.2 06/06/2019   -not on ACE/ARB  -diet changes including salt restriction -limit eating outside -counseled BP targets per standards of diabetes care -uncontrolled blood pressure can lead to retinopathy, nephropathy and cardiovascular and atherosclerotic heart disease  Reviewed and counseled on: -A1C target -Blood sugar targets -Complications of uncontrolled diabetes  -Checking blood sugar before meals and bedtime and bring log next visit -All medications with mechanism of action and side effects -Hypoglycemia management: rule of 15's, Glucagon  Emergency Kit and medical alert ID -low-carb low-fat plate-method diet -At least 20 minutes of physical activity per day -Annual dilated retinal eye exam and foot exam -compliance and follow up needs -follow up as scheduled or earlier if problem gets worse  Call if blood sugar is less than 70 or consistently above 250    Take a 15 gm snack of carbohydrate at bedtime before you go to sleep if your blood sugar is less than 100.    If you are going to fast after midnight for a test or procedure, ask your physician for instructions on how to reduce/decrease your insulin  dose.    Call if blood sugar is less than 70 or consistently above 250  -Treating a low sugar by rule of 15  (15 gms of sugar every 15 min until sugar is more than 70) If you feel your sugar is low, test your sugar to be sure If your sugar is low (  less than 70), then take 15 grams of a fast acting Carbohydrate (3-4 glucose tablets or glucose gel or 4 ounces of juice or regular soda) Recheck your sugar 15 min after treating low to make sure it is more than 70 If sugar is still less than 70, treat again with 15 grams of carbohydrate          Don't drive the hour of hypoglycemia  If unconscious/unable to eat or drink by mouth, use glucagon  injection or nasal spray baqsimi  and call 911. Can repeat again in 15 min if still  unconscious.  Return in about 4 months (around 01/31/2024).   I have reviewed current medications, nurse's notes, allergies, vital signs, past medical and surgical history, family medical history, and social history for this encounter. Counseled patient on symptoms, examination findings, lab findings, imaging results, treatment decisions and monitoring and prognosis. The patient understood the recommendations and agrees with the treatment plan. All questions regarding treatment plan were fully answered.  Obadiah Birmingham, MD  10/01/23    History of Present Illness Samantha Lucas is a 33 y.o. year old female who presents for follow up of Type II diabetes mellitus.  Samantha Lucas was first diagnosed at age 60. Diabetes education +  Home diabetes regimen: Glipizide  10 mg bid-ran out  Stopped Lantus  10-15 units at bedtime when it Samantha Lucas Ozempic  2 mg/week Metformin  XR 500 mg bid  COMPLICATIONS -  MI/Stroke -  retinopathy +  neuropathy -  nephropathy  BLOOD SUGAR DATA 60-152 checked 2 times a day  Physical Exam  BP 120/80   Pulse 81   Ht 5' 10 (1.778 m)   Wt 247 lb 12.8 oz (112.4 kg)   SpO2 98%   BMI 35.56 kg/m    Constitutional: well developed, well nourished Head: normocephalic, atraumatic Eyes: sclera anicteric, no redness Neck: supple Lungs: normal respiratory effort Neurology: alert and oriented Skin: dry, no appreciable rashes Musculoskeletal: no appreciable defects Psychiatric: normal mood and affect Diabetic Foot Exam - Simple   No data filed      Current Medications Patient's Medications  New Prescriptions   No medications on file  Previous Medications   ALBUTEROL  (VENTOLIN  HFA) 108 (90 BASE) MCG/ACT INHALER    TAKE 2 PUFFS BY MOUTH EVERY 6 HOURS AS NEEDED FOR WHEEZE OR SHORTNESS OF BREATH   CHOLECALCIFEROL (D3 PO)    Take 1 tablet by mouth daily.   CONTINUOUS GLUCOSE SENSOR (FREESTYLE LIBRE 3 PLUS SENSOR) MISC    Change sensor every 15 days.    CYANOCOBALAMIN (B-12 PO)    Take 1 tablet by mouth daily.   GLUCAGON  (BAQSIMI  ONE PACK) 3 MG/DOSE POWD    Place 1 Device into the nose as needed (Low blood sugar with impaired consciousness).   INSULIN  GLARGINE (LANTUS ) 100 UNIT/ML SOPN    Inject 10 Units into the skin at bedtime.   MAGNESIUM PO    Take 1 tablet by mouth daily as needed (Supplement).   METFORMIN  (GLUCOPHAGE -XR) 500 MG 24 HR TABLET    Take 2 tablets (1,000 mg total) by mouth 2 (two) times daily with a meal.   MULTIPLE VITAMIN (MULITIVITAMIN WITH MINERALS) TABS    Take 1 tablet by mouth daily.   NITROFURANTOIN , MACROCRYSTAL-MONOHYDRATE, (MACROBID ) 100 MG CAPSULE    Take 1 capsule (100 mg total) by mouth 2 (two) times daily.   SEMAGLUTIDE , 2 MG/DOSE, 8 MG/3ML SOPN    Inject 2 mg as directed once a week.   ZINC  SULFATE (ZINC-220 PO)    Take 1 tablet by mouth daily.  Modified Medications   Modified Medication Previous Medication   GLIPIZIDE  (GLUCOTROL ) 5 MG TABLET glipiZIDE  (GLUCOTROL ) 10 MG tablet      Take 1 tablet (5 mg total) by mouth as needed (for blood sugar more than 200).    TAKE 1 TABLET BY MOUTH TWICE A DAY BEFORE A MEAL  Discontinued Medications   No medications on file    Allergies Allergies  Allergen Reactions   Metformin  And Related     GI upset    Other     [place]     Past Medical History Past Medical History:  Diagnosis Date   Allergy    Back pain    Diabetes mellitus without complication (HCC) 05/2012   Scoliosis (and kyphoscoliosis), idiopathic    Viral cardiomyopathy (HCC) 2016    Past Surgical History Past Surgical History:  Procedure Laterality Date   WISDOM TOOTH EXTRACTION  dec 2012    Family History family history includes Cancer in her mother and paternal grandmother; Diabetes in her father; Hypertension in her father, mother, and paternal grandmother; Obesity in her mother; Stroke in her father.  Social History Social History   Socioeconomic History   Marital status: Single     Spouse name: Not on file   Number of children: 0   Years of education: Not on file   Highest education level: Some college, no degree  Occupational History   Occupation: phlebotomist  Tobacco Use   Smoking status: Never   Smokeless tobacco: Never  Vaping Use   Vaping status: Never Used  Substance and Sexual Activity   Alcohol use: Yes    Comment: RARELY   Drug use: No   Sexual activity: Yes    Birth control/protection: None  Other Topics Concern   Not on file  Social History Narrative   Not on file   Social Drivers of Lucas   Financial Resource Strain: Low Risk  (05/12/2023)   Overall Financial Resource Strain (CARDIA)    Difficulty of Paying Living Expenses: Not very hard  Food Insecurity: No Food Insecurity (05/12/2023)   Hunger Vital Sign    Worried About Running Out of Food in the Last Year: Never true    Ran Out of Food in the Last Year: Never true  Transportation Needs: No Transportation Needs (05/12/2023)   PRAPARE - Administrator, Civil Service (Medical): No    Lack of Transportation (Non-Medical): No  Physical Activity: Sufficiently Active (05/12/2023)   Exercise Vital Sign    Days of Exercise per Week: 3 days    Minutes of Exercise per Session: 60 min  Stress: No Stress Concern Present (05/12/2023)   Samantha Lucas - Occupational Stress Questionnaire    Feeling of Stress : Not at all  Social Connections: Moderately Integrated (05/12/2023)   Social Connection and Isolation Panel    Frequency of Communication with Friends and Family: More than three times a week    Frequency of Social Gatherings with Friends and Family: Once a week    Attends Religious Services: More than 4 times per year    Active Member of [employer] or Organizations: Yes    Attends Banker Meetings: More than 4 times per year    Marital Status: Never married  Intimate Partner Violence: Not on file    Lab Results  Component Value Date   HGBA1C  10.0 (A) 05/13/2023   HGBA1C  12.6 (H) 02/12/2023   HGBA1C 8.9 07/06/2022   Lab Results  Component Value Date   CHOL 181 05/20/2023   Lab Results  Component Value Date   HDL 40 05/20/2023   Lab Results  Component Value Date   LDLCALC 122 (H) 05/20/2023   Lab Results  Component Value Date   TRIG 105 05/20/2023   Lab Results  Component Value Date   CHOLHDL 4.5 (H) 05/20/2023   Lab Results  Component Value Date   CREATININE 0.56 03/26/2023   Lab Results  Component Value Date   GFR 120.33 03/07/2021   Lab Results  Component Value Date   MICROALBUR <0.2 06/06/2019      Component Value Date/Time   NA 136 03/26/2023 1117   NA 138 02/12/2023 1119   K 3.7 03/26/2023 1117   CL 101 03/26/2023 1104   CO2 23 03/26/2023 1104   GLUCOSE 330 (H) 03/26/2023 1104   BUN 9 03/26/2023 1104   BUN 9 02/12/2023 1119   CREATININE 0.56 03/26/2023 1104   CREATININE 0.60 10/04/2019 1204   CALCIUM 9.0 03/26/2023 1104   PROT 7.2 03/26/2023 1104   PROT 7.1 02/12/2023 1119   ALBUMIN 3.2 (L) 03/26/2023 1104   ALBUMIN 4.2 02/12/2023 1119   AST 17 03/26/2023 1104   ALT 19 03/26/2023 1104   ALKPHOS 109 03/26/2023 1104   BILITOT 0.5 03/26/2023 1104   BILITOT 0.4 02/12/2023 1119   GFRNONAA >60 03/26/2023 1104   GFRNONAA 124 10/05/2017 1204   GFRAA 144 01/16/2020 0903   GFRAA 143 10/05/2017 1204      Latest Ref Rng & Units 03/26/2023   11:17 AM 03/26/2023   11:04 AM 03/26/2023    9:19 AM  BMP  Glucose 70 - 99 mg/dL  669  585   BUN 6 - 20 mg/dL  9  11   Creatinine 9.55 - 1.00 mg/dL  9.43  9.39   Sodium 864 - 145 mmol/L 136  133  134   Potassium 3.5 - 5.1 mmol/L 3.7  3.7  4.5   Chloride 98 - 111 mmol/L  101  103   CO2 22 - 32 mmol/L  23    Calcium 8.9 - 10.3 mg/dL  9.0         Component Value Date/Time   WBC 9.9 03/26/2023 0912   RBC 4.80 03/26/2023 0912   HGB 14.3 03/26/2023 1117   HCT 42.0 03/26/2023 1117   PLT 322 03/26/2023 0912   MCV 84.0 03/26/2023 0912   MCH 26.7  03/26/2023 0912   MCHC 31.8 03/26/2023 0912   RDW 14.1 03/26/2023 0912   LYMPHSABS 3,211 10/04/2019 1204   MONOABS 728 02/07/2016 1032   EOSABS 152 10/04/2019 1204   BASOSABS 29 10/04/2019 1204     Parts of this note may have been dictated using voice recognition software. There may be variances in spelling and vocabulary which are unintentional. Not all errors are proofread. Please notify the dino if any discrepancies are noted or if the meaning of any statement is not clear.

## 2023-11-14 ENCOUNTER — Encounter: Payer: Self-pay | Admitting: "Endocrinology

## 2023-11-15 ENCOUNTER — Other Ambulatory Visit: Payer: Self-pay

## 2023-11-15 DIAGNOSIS — E11649 Type 2 diabetes mellitus with hypoglycemia without coma: Secondary | ICD-10-CM

## 2023-11-15 MED ORDER — METFORMIN HCL ER 500 MG PO TB24: 1000.0000 mg | ORAL_TABLET | Freq: Two times a day (BID) | ORAL | 1 refills | Status: AC

## 2023-11-15 MED ORDER — GLIPIZIDE 5 MG PO TABS: 5.0000 mg | ORAL_TABLET | ORAL | 1 refills | Status: AC | PRN

## 2023-12-02 ENCOUNTER — Telehealth: Admitting: "Endocrinology

## 2023-12-09 ENCOUNTER — Ambulatory Visit: Admitting: "Endocrinology

## 2024-01-12 ENCOUNTER — Encounter: Payer: Self-pay | Admitting: Obstetrics and Gynecology

## 2024-01-31 ENCOUNTER — Ambulatory Visit: Admitting: "Endocrinology

## 2024-02-24 ENCOUNTER — Encounter: Payer: Self-pay | Admitting: Obstetrics and Gynecology

## 2024-02-24 ENCOUNTER — Ambulatory Visit: Admitting: Obstetrics and Gynecology

## 2024-02-24 ENCOUNTER — Ambulatory Visit: Payer: Self-pay | Admitting: Obstetrics and Gynecology

## 2024-02-24 ENCOUNTER — Other Ambulatory Visit (HOSPITAL_COMMUNITY)
Admission: RE | Admit: 2024-02-24 | Discharge: 2024-02-24 | Disposition: A | Source: Ambulatory Visit | Attending: Obstetrics and Gynecology | Admitting: Obstetrics and Gynecology

## 2024-02-24 VITALS — BP 139/78 | HR 79 | Ht 70.0 in | Wt 243.0 lb

## 2024-02-24 DIAGNOSIS — Z01419 Encounter for gynecological examination (general) (routine) without abnormal findings: Secondary | ICD-10-CM

## 2024-02-24 NOTE — Progress Notes (Signed)
 Subjective:     Samantha Lucas is a 34 y.o. female P0 with LMP 01/30/24 and BMI 34 who is here for a comprehensive physical exam. The patient reports no problems. She reports a monthly period lasting 4 days. She denies pelvic pain or abnormal discharge. She is not currently using any contraception and plans abstinence until an engagement sometime in 2026. She has discontinued the Spironolactone . Patient is also working on weight loss in preparation for conception in the next few years and is currently on a GLP-1. Patient is also working to get her diabetes under better control.   Past Medical History:  Diagnosis Date   Allergy    Back pain    Diabetes mellitus without complication (HCC) 05/2012   Scoliosis (and kyphoscoliosis), idiopathic    Viral cardiomyopathy (HCC) 2016   Past Surgical History:  Procedure Laterality Date   WISDOM TOOTH EXTRACTION  dec 2012   Family History  Problem Relation Age of Onset   Hypertension Mother    Cancer Mother    Obesity Mother    Stroke Father    Hypertension Father    Diabetes Father    Cancer Paternal Grandmother        lung and breast cancer   Hypertension Paternal Grandmother     Social History   Socioeconomic History   Marital status: Single    Spouse name: Not on file   Number of children: 0   Years of education: Not on file   Highest education level: Some college, no degree  Occupational History   Occupation: phlebotomist  Tobacco Use   Smoking status: Never   Smokeless tobacco: Never  Vaping Use   Vaping status: Never Used  Substance and Sexual Activity   Alcohol use: Yes    Comment: RARELY   Drug use: No   Sexual activity: Yes    Birth control/protection: None  Other Topics Concern   Not on file  Social History Narrative   Not on file   Social Drivers of Health   Tobacco Use: Low Risk (10/01/2023)   Patient History    Smoking Tobacco Use: Never    Smokeless Tobacco Use: Never    Passive Exposure: Not on file   Financial Resource Strain: Low Risk (05/12/2023)   Overall Financial Resource Strain (CARDIA)    Difficulty of Paying Living Expenses: Not very hard  Food Insecurity: No Food Insecurity (05/12/2023)   Hunger Vital Sign    Worried About Running Out of Food in the Last Year: Never true    Ran Out of Food in the Last Year: Never true  Transportation Needs: No Transportation Needs (05/12/2023)   PRAPARE - Administrator, Civil Service (Medical): No    Lack of Transportation (Non-Medical): No  Physical Activity: Sufficiently Active (05/12/2023)   Exercise Vital Sign    Days of Exercise per Week: 3 days    Minutes of Exercise per Session: 60 min  Stress: No Stress Concern Present (05/12/2023)   [employer] of Occupational Health - Occupational Stress Questionnaire    Feeling of Stress : Not at all  Social Connections: Moderately Integrated (05/12/2023)   Social Connection and Isolation Panel    Frequency of Communication with Friends and Family: More than three times a week    Frequency of Social Gatherings with Friends and Family: Once a week    Attends Religious Services: More than 4 times per year    Active Member of [employer] or Organizations: Yes  Attends Banker Meetings: More than 4 times per year    Marital Status: Never married  Intimate Partner Violence: Not on file  Depression (PHQ2-9): Low Risk (03/31/2023)   Depression (PHQ2-9)    PHQ-2 Score: 0  Alcohol Screen: Not on file  Housing: Unknown (05/12/2023)   Housing Stability Vital Sign    Unable to Pay for Housing in the Last Year: No    Number of Times Moved in the Last Year: Not on file    Homeless in the Last Year: No  Utilities: Not on file  Health Literacy: Not on file   Health Maintenance  Topic Date Due   Pneumococcal Vaccine (2 of 2 - PCV) 01/10/2014   FOOT EXAM  07/13/2023   Influenza Vaccine  08/27/2023   COVID-19 Vaccine (5 - 2025-26 season) 09/27/2023   HEMOGLOBIN A1C  11/12/2023    OPHTHALMOLOGY EXAM  12/31/2023   Diabetic kidney evaluation - Urine ACR  02/12/2024   Diabetic kidney evaluation - eGFR measurement  03/25/2024   Cervical Cancer Screening (HPV/Pap Cotest)  06/14/2025   DTaP/Tdap/Td (8 - Td or Tdap) 07/06/2027   HPV VACCINES  Completed   Hepatitis C Screening  Completed   HIV Screening  Completed   Meningococcal B Vaccine  Completed   Hepatitis B Vaccines 19-59 Average Risk  Discontinued       Review of Systems Pertinent items noted in HPI and remainder of comprehensive ROS otherwise negative.   Objective:  Blood pressure 139/78, pulse 79, height 5' 10 (1.778 m), weight 243 lb (110.2 kg), last menstrual period 01/30/2024.   GENERAL: Well-developed, well-nourished female in no acute distress.  HEENT: Normocephalic, atraumatic. Sclerae anicteric.  NECK: Supple. Normal thyroid.  LUNGS: Clear to auscultation bilaterally.  HEART: Regular rate and rhythm. BREASTS: Symmetric in size. No palpable masses or lymphadenopathy, skin changes, or nipple drainage. ABDOMEN: Soft, nontender, nondistended. No organomegaly. PELVIC: Normal external female genitalia. Vagina is pink and rugated.  Normal discharge. Normal appearing cervix. Uterus is normal in size. No adnexal mass or tenderness. Chaperone present during the pelvic exam EXTREMITIES: No cyanosis, clubbing, or edema, 2+ distal pulses.     Assessment:    Healthy female exam.      Plan:    Pap smear collected STI screening per patient request Pre-conception labs ordered mainly A1C and TSH Patient encouraged to take prenatal vitamins and to discuss conception plans with PCP as many unknowns exists on GLP-1 and pregnancy Patient will be contacted with abnormal results See After Visit Summary for Counseling Recommendations

## 2024-02-25 LAB — CBC
Hematocrit: 35.9 % (ref 34.0–46.6)
Hemoglobin: 11.2 g/dL (ref 11.1–15.9)
MCH: 24 pg — ABNORMAL LOW (ref 26.6–33.0)
MCHC: 31.2 g/dL — ABNORMAL LOW (ref 31.5–35.7)
MCV: 77 fL — ABNORMAL LOW (ref 79–97)
Platelets: 409 10*3/uL (ref 150–450)
RBC: 4.67 x10E6/uL (ref 3.77–5.28)
RDW: 14.6 % (ref 11.7–15.4)
WBC: 9 10*3/uL (ref 3.4–10.8)

## 2024-02-25 LAB — HEMOGLOBIN A1C
Est. average glucose Bld gHb Est-mCnc: 217 mg/dL
Hgb A1c MFr Bld: 9.2 % — ABNORMAL HIGH (ref 4.8–5.6)

## 2024-02-25 LAB — CERVICOVAGINAL ANCILLARY ONLY
Chlamydia: NEGATIVE
Comment: NEGATIVE
Comment: NORMAL
Neisseria Gonorrhea: NEGATIVE

## 2024-02-25 LAB — HIV ANTIBODY (ROUTINE TESTING W REFLEX): HIV Screen 4th Generation wRfx: NONREACTIVE

## 2024-02-25 LAB — TSH: TSH: 3.47 u[IU]/mL (ref 0.450–4.500)

## 2024-02-25 LAB — HEPATITIS C ANTIBODY: Hep C Virus Ab: NONREACTIVE

## 2024-02-25 LAB — SYPHILIS: RPR W/REFLEX TO RPR TITER AND TREPONEMAL ANTIBODIES, TRADITIONAL SCREENING AND DIAGNOSIS ALGORITHM: RPR Ser Ql: NONREACTIVE

## 2024-02-25 LAB — HEPATITIS B SURFACE ANTIGEN: Hepatitis B Surface Ag: NEGATIVE

## 2024-02-28 LAB — CYTOLOGY - PAP
Comment: NEGATIVE
Diagnosis: NEGATIVE
High risk HPV: NEGATIVE

## 2024-03-01 ENCOUNTER — Other Ambulatory Visit (HOSPITAL_BASED_OUTPATIENT_CLINIC_OR_DEPARTMENT_OTHER): Payer: Self-pay

## 2024-03-01 DIAGNOSIS — B9689 Other specified bacterial agents as the cause of diseases classified elsewhere: Secondary | ICD-10-CM

## 2024-03-01 MED ORDER — METRONIDAZOLE 500 MG PO TABS: 500.0000 mg | ORAL_TABLET | Freq: Two times a day (BID) | ORAL | 0 refills | Status: AC

## 2024-03-13 ENCOUNTER — Ambulatory Visit: Admitting: Obstetrics and Gynecology

## 2024-04-07 ENCOUNTER — Ambulatory Visit: Admitting: "Endocrinology
